# Patient Record
Sex: Female | Born: 1950
Health system: Southern US, Community
[De-identification: ages and names within clinical notes are randomized; demographics above are authoritative.]

## PROBLEM LIST (undated history)

## (undated) DIAGNOSIS — H269 Unspecified cataract: Secondary | ICD-10-CM

## (undated) DIAGNOSIS — I10 Essential (primary) hypertension: Secondary | ICD-10-CM

## (undated) DIAGNOSIS — K219 Gastro-esophageal reflux disease without esophagitis: Secondary | ICD-10-CM

## (undated) DIAGNOSIS — H9190 Unspecified hearing loss, unspecified ear: Secondary | ICD-10-CM

## (undated) DIAGNOSIS — E119 Type 2 diabetes mellitus without complications: Secondary | ICD-10-CM

## (undated) DIAGNOSIS — I219 Acute myocardial infarction, unspecified: Secondary | ICD-10-CM

## (undated) DIAGNOSIS — K5792 Diverticulitis of intestine, part unspecified, without perforation or abscess without bleeding: Secondary | ICD-10-CM

## (undated) DIAGNOSIS — I499 Cardiac arrhythmia, unspecified: Secondary | ICD-10-CM

## (undated) HISTORY — DX: Acute myocardial infarction, unspecified: I21.9

## (undated) HISTORY — DX: Type 2 diabetes mellitus without complications: E11.9

## (undated) HISTORY — PX: COLONOSCOPY: SHX174

## (undated) HISTORY — DX: Essential (primary) hypertension: I10

## (undated) HISTORY — DX: Gastro-esophageal reflux disease without esophagitis: K21.9

## (undated) HISTORY — DX: Diverticulitis of intestine, part unspecified, without perforation or abscess without bleeding: K57.92

## (undated) HISTORY — DX: Cardiac arrhythmia, unspecified: I49.9

## (undated) HISTORY — PX: BREAST SURGERY: SHX581

---
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
## 1971-03-03 DIAGNOSIS — I499 Cardiac arrhythmia, unspecified: Secondary | ICD-10-CM

## 1971-03-03 HISTORY — DX: Cardiac arrhythmia, unspecified: I49.9

## 1972-03-02 HISTORY — PX: EXTERNAL EAR SURGERY: SHX627

## 1973-03-02 HISTORY — PX: CHOLECYSTECTOMY: SHX55

## 1975-03-03 HISTORY — PX: TUBAL LIGATION: SHX77

## 1998-09-19 ENCOUNTER — Inpatient Hospital Stay (HOSPITAL_COMMUNITY): Admission: AD | Admit: 1998-09-19 | Discharge: 1998-09-19 | Payer: Self-pay | Admitting: Obstetrics

## 2003-05-24 ENCOUNTER — Ambulatory Visit (HOSPITAL_COMMUNITY): Admission: RE | Admit: 2003-05-24 | Discharge: 2003-05-24 | Payer: Self-pay | Admitting: Family Medicine

## 2003-07-03 ENCOUNTER — Ambulatory Visit (HOSPITAL_COMMUNITY): Admission: RE | Admit: 2003-07-03 | Discharge: 2003-07-03 | Payer: Self-pay | Admitting: General Surgery

## 2004-03-02 DIAGNOSIS — K219 Gastro-esophageal reflux disease without esophagitis: Secondary | ICD-10-CM

## 2004-03-02 HISTORY — DX: Gastro-esophageal reflux disease without esophagitis: K21.9

## 2004-09-14 ENCOUNTER — Emergency Department (HOSPITAL_COMMUNITY): Admission: EM | Admit: 2004-09-14 | Discharge: 2004-09-14 | Payer: Self-pay | Admitting: Emergency Medicine

## 2006-03-02 DIAGNOSIS — I219 Acute myocardial infarction, unspecified: Secondary | ICD-10-CM

## 2006-03-02 HISTORY — DX: Acute myocardial infarction, unspecified: I21.9

## 2006-12-06 ENCOUNTER — Ambulatory Visit: Payer: Self-pay | Admitting: Cardiology

## 2007-04-25 ENCOUNTER — Ambulatory Visit: Payer: Self-pay | Admitting: Cardiology

## 2007-12-22 ENCOUNTER — Ambulatory Visit: Payer: Self-pay | Admitting: Cardiology

## 2008-07-26 ENCOUNTER — Ambulatory Visit: Payer: Self-pay | Admitting: Cardiology

## 2010-07-15 NOTE — Assessment & Plan Note (Signed)
[facility]                          [facility]   Wendy Mosley, Wendy Mosley                  MRN:          [id number]  DATE:07/26/2008                            DOB:          1950/11/13    REFERRING PHYSICIAN:  Lia Hopping   HISTORY OF PRESENT ILLNESS:  This patient is a 60 year old female with  history of tobacco use and palpitations.  The patient reports a history  of vasovagal syncope during the last office visit.  The patient's  symptoms were very consistent with a vasovagal episode.  She has now  increased her drinking pattern.  Her episodes have become less frequent.  Unfortunately, she is salt depleted and does not use much salt with her  diet.  I gave her instructions to liberalize her salt intake and  encouraged further fluid intake.   MEDICATIONS:  1. Aspirin 325 daily.  2. Potassium 20 mEq p.o. daily.  3. Omega-3 fish oil.  4. Over-the-counter reflux medication.   PHYSICAL EXAMINATION:  VITAL SIGNS:  Blood pressure 126/87, heart rate  94, weight 143 pounds.  NECK:  Normal carotid upstroke.  No carotid bruits.  LUNGS:  Clear breath sounds bilaterally.  HEART:  Regular rate and rhythm.  Normal S1 and S2.  No murmurs, rubs,  or gallops.  ABDOMEN:  Soft.  EXTREMITIES:  No cyanosis, clubbing, or edema.   PROBLEM LIST:  1. History of palpitations, stable.  2. Presyncope much improved with fluid intake secondary to vasovagal      syncope.  3. Remote paroxysmal atrial fibrillation, CHAD2 score is 0.  4. Tobacco use.  5. Coronary artery disease less than 50% bilaterally.  6. Insomnia.   PLAN:  1. The patient will be scheduled next visit for carotid Dopplers.  2. The patient has had no significant vasovagal syncope and is finally      now starting to      increase her salt intake.  We also gave her some literature about      vasovagal syncope.  3. The patient can follow up with [place] in 6 months.     Learta Codding,  MD,FACC  Electronically Signed    GED/MedQ  DD: 07/26/2008  DT: 07/27/2008  Job #: [id number]   cc:   Lia Hopping

## 2010-07-15 NOTE — Assessment & Plan Note (Signed)
[facility]                          [facility]ICE NOTE   TARNESHA, ULLOA                  MRN:          [id number]  DATE:04/25/2007                            DOB:          1951/01/06    PRIMARY CARDIOLOGIST:  Dr. Lewayne Bunting.   REASON FOR VISIT:  Post hospital followup.   [name] presents to our clinic for her official post hospital  followup, although she was discharged from [facility] back in  October 2008.  At that time, she was referred to [place] for evaluation of a  near-syncopal episode.  She presented with remote history of PAF and no  other known history of heart disease.   The patient underwent extensive workup, including 2-D echocardiography  and exercise stress testing.  Echocardiogram showed normal LVEF (55% to  60%), with no wall motion abnormalities and no significant valvular  disease.   The stress test was also negative, during which the patient exercised  nearly 8 minutes. There was no inducible chest pain, nor significant EKG  changes.  Perfusion imaging showed no evidence of ischemia; calculated  ejection fraction was 69%.   Additionally, the patient had carotid Dopplers, in light of a history of  nonobstructive disease by previous studies in 2008.  This more recent  study, however, showed only minimal atherosclerosis.   Further recommendations consisted of an outpatient CardioNet monitoring;  however, the patient informs me today that this was never arranged.   From a clinical standpoint, Ms. Lattimore has not had any recurrent  spells.  She reports compliance with her medications, of which atenolol  was apparently added during this hospitalization; however, she just  recently ran out this past week, but otherwise felt that it did seem to  help suppress her occasional palpitations.   Unfortunately, Ms. 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   Electrocardiogram today reveals NSR at 56 BPM with normal axis and  nonspecific ST changes.   CURRENT MEDICATIONS:  1. Full-dose aspirin.  2. Nexium.  3. Potassium 20 mEq daily.   PHYSICAL EXAM:  VITAL SIGNS:  Blood pressure 109/69, pulse 74, regular,  weight 161.6.  GENERAL:  A 60 year old female sitting upright in no distress.  HEENT:  Normocephalic, atraumatic.  NECK:  Palpable bilateral carotid pulses without bruits; no JVD.  LUNGS:  Clear to auscultation in all fields.  HEART:  Regular rate and rhythm (S1 and S2), no significant murmurs.  No  rubs.  ABDOMEN:  Soft, nontender, intact bowel sounds.  EXTREMITIES:  No significant edema.  NEUROLOGIC:  No focal deficits.   IMPRESSION:  1. Palpitations.      a.     Improved with the addition of beta blocker.  2. Normal left ventricular function.  3. Remote paroxysmal atrial fibrillation.      a.     [place] II score:  zero.  4. History of tobacco.  5. Preserved left ventricular function.  6. Relative hypotension.   PLAN:  1. We will defer further evaluation with a CardioNet monitor, in the  absence of any recurrent spells.  Moreover, the patient has been      recently laid off from her job and I would like to try to keep her      financial costs to a minimum, if at all possible.  If she does have      worsening palpitations, and/or recurrent near syncope, then we will      further evaluate with a CardioNet monitor.  2. With respect to medications, I suggested that we not continue      atenolol.  It is not clear to me why this beta blocker was chosen,      given that she was not on any beta blocker prior to admission.  She      is currently hypotensive and, in fact, presents with no history of      hypertension.  I therefore suggested that we provide her with a      prescription for diltiazem 30 mg to be used as needed, if she were      to have any sustained tachy-palpitations.  She was quite agreeable      with  this plan.  3. BMET with magnesium level.  The patient has been taking      supplemental potassium since her hospitalization in October.  She      did have a potassium level of 3.1 at that time.  I do not have any      followup blood work with respect to electrolytes.  We will check a      magnesium level to see if this is low as well, and possibly explain      her hypokalemia.  Of note, she was      not on any diuretic at time of admission.  4. Schedule return clinic followup with myself and Dr. Andee Lineman in 4      months.      Gene Serpe, PA-C  Electronically Signed      Learta Codding, MD,FACC  Electronically Signed   GS/MedQ  DD: 04/25/2007  DT: 04/26/2007  Job #: [id number]   cc:   Lia Hopping

## 2010-07-15 NOTE — Assessment & Plan Note (Signed)
[facility]                          [facility]ICE NOTE   Wendy Mosley, Wendy Mosley                  MRN:          [id number]  DATE:12/22/2007                            DOB:          1951/02/05    REFERRING PHYSICIAN:  Lia Hopping   REFERRING PHYSICIAN:  Lia Hopping   HISTORY OF PRESENT ILLNESS:  The patient is a 60 year old female with  prior history of tobacco use and palpitations.  The patient reports near  syncopal episode approximately 2 weeks ago.  She states she was sitting  in a game room.  She has suddenly started feeling hot and got nauseated.  She felt the clothing around got very tight and she stood up rapidly to  go to the bathroom, suddenly wave of heat came over her and she felt her  legs were giving out by the time she try to get into the bathroom, she  slumped over and briefly was presyncopal without any definite loss of  consciousness.  She was profusely sweating during the episode.  After  they applied some cold water rags to her forehead, she improved.  Her  friend also gave a dose of Cardizem 30 mg p.o. as previously recommended  by Gene Serpe, PA-C.  Shortly thereafter, the patient improved and was  able to stand up and had no further problems.  The patient had actually  reports to me that since she has been laid off.  She has had felt poorly  and depressed.  She also stays up all night long and has a very  disturbed sleep pattern.  Next, the patient in the office today looks  exhausted.   MEDICATIONS:  1. Aspirin 325 mg p.o. daily.  2. Potassium 20 mg p.o. daily.  3. Negative fish oil daily.  4. Over-the-counter reflux medications.   ALLERGIES:  SULFA causes rash.   PHYSICAL EXAMINATION:  VITAL SIGNS:  Blood pressure 128/84, heart rate  93, and weight 157 pounds.  NECK:  Normal carotid stroke and no carotid bruits.  LUNGS:  Clear.  Breath sounds bilaterally.  HEART:  Regular rate and rhythm with normal S1  and S2.  No murmurs,  rubs, or gallops.  ABDOMEN:  Soft, nontender.  No rebound or guarding.  Good bowel sounds.  EXTREMITIES:  No cyanosis, clubbing, or edema.   PROBLEM LIST:  1. History of palpitations.  2. Recent presyncope secondary to vasovagal syncope.  3. Remote paroxysmal atrial fibrillation, CHAD2 score is 0.  4. History of tobacco use.  5. Carotid artery disease, left 50% bilaterally.  6. Insomnia.   PLAN:  1. The patient appears very exhausted and depressed since she has lost      her job.  I have recommended that she tries to resume a normal      sleep pattern and I gave her a small prescription of trazodone 50      mg p.o. q.h.s. particular in light of the fact that I think she      also has depression.  I ask her to follow up further with her      primary care  physician on this issue.  2. Next visit, the patient should be considered for repeat carotid      Dopplers.  3. The patient has definite vasovagal syncope and I gave her the      patient information regarding this.  I also told her that if she      has an event like that one she described above, she should not take      p.r.n. Cardizem, but only due to her rapid heart rates and      palpitations without presyncope.  4. The patient will follow up with [place] in 6 months.     Learta Codding, MD,FACC  Electronically Signed    GED/MedQ  DD: 12/25/2007  DT: 12/25/2007  Job #: [id number]   cc:   Lia Hopping

## 2010-08-19 ENCOUNTER — Emergency Department (HOSPITAL_COMMUNITY)
Admission: EM | Admit: 2010-08-19 | Discharge: 2010-08-20 | Disposition: A | Discharge status: Home / Self Care | Payer: Self-pay | Attending: Emergency Medicine | Admitting: Emergency Medicine

## 2010-08-19 DIAGNOSIS — B3731 Acute candidiasis of vulva and vagina: Secondary | ICD-10-CM | POA: Insufficient documentation

## 2010-08-19 DIAGNOSIS — N949 Unspecified condition associated with female genital organs and menstrual cycle: Secondary | ICD-10-CM | POA: Insufficient documentation

## 2010-08-19 DIAGNOSIS — N9089 Other specified noninflammatory disorders of vulva and perineum: Secondary | ICD-10-CM | POA: Insufficient documentation

## 2010-08-19 DIAGNOSIS — B373 Candidiasis of vulva and vagina: Secondary | ICD-10-CM | POA: Insufficient documentation

## 2010-08-19 DIAGNOSIS — N899 Noninflammatory disorder of vagina, unspecified: Secondary | ICD-10-CM | POA: Insufficient documentation

## 2011-07-02 ENCOUNTER — Other Ambulatory Visit (HOSPITAL_COMMUNITY)
Admission: RE | Admit: 2011-07-02 | Discharge: 2011-07-02 | Disposition: A | Discharge status: Home / Self Care | Payer: Self-pay | Source: Ambulatory Visit | Attending: Family Medicine | Admitting: Family Medicine

## 2011-07-02 ENCOUNTER — Ambulatory Visit (INDEPENDENT_AMBULATORY_CARE_PROVIDER_SITE_OTHER): Payer: Self-pay | Admitting: Sports Medicine

## 2011-07-02 ENCOUNTER — Encounter: Payer: Self-pay | Admitting: Sports Medicine

## 2011-07-02 VITALS — BP 147/81 | HR 81 | Temp 98.5°F | Ht 64.0 in | Wt 165.7 lb

## 2011-07-02 DIAGNOSIS — IMO0001 Reserved for inherently not codable concepts without codable children: Secondary | ICD-10-CM

## 2011-07-02 DIAGNOSIS — N904 Leukoplakia of vulva: Secondary | ICD-10-CM

## 2011-07-02 DIAGNOSIS — Z01419 Encounter for gynecological examination (general) (routine) without abnormal findings: Secondary | ICD-10-CM | POA: Insufficient documentation

## 2011-07-02 DIAGNOSIS — Z0000 Encounter for general adult medical examination without abnormal findings: Secondary | ICD-10-CM

## 2011-07-02 DIAGNOSIS — N952 Postmenopausal atrophic vaginitis: Secondary | ICD-10-CM

## 2011-07-02 DIAGNOSIS — L940 Localized scleroderma [morphea]: Secondary | ICD-10-CM

## 2011-07-02 DIAGNOSIS — Z1159 Encounter for screening for other viral diseases: Secondary | ICD-10-CM | POA: Insufficient documentation

## 2011-07-02 DIAGNOSIS — K219 Gastro-esophageal reflux disease without esophagitis: Secondary | ICD-10-CM | POA: Insufficient documentation

## 2011-07-02 DIAGNOSIS — Z124 Encounter for screening for malignant neoplasm of cervix: Secondary | ICD-10-CM

## 2011-07-02 DIAGNOSIS — Z23 Encounter for immunization: Secondary | ICD-10-CM

## 2011-07-02 DIAGNOSIS — R030 Elevated blood-pressure reading, without diagnosis of hypertension: Secondary | ICD-10-CM

## 2011-07-02 LAB — BASIC METABOLIC PANEL
BUN: 16 mg/dL (ref 6–23)
CO2: 30 mEq/L (ref 19–32)
Calcium: 8.9 mg/dL (ref 8.4–10.5)
Chloride: 104 mEq/L (ref 96–112)
Creat: 1.22 mg/dL — ABNORMAL HIGH (ref 0.50–1.10)
Glucose, Bld: 91 mg/dL (ref 70–99)
Potassium: 3.9 mEq/L (ref 3.5–5.3)
Sodium: 142 mEq/L (ref 135–145)

## 2011-07-02 LAB — LDL CHOLESTEROL, DIRECT: Direct LDL: 138 mg/dL — ABNORMAL HIGH

## 2011-07-02 MED ORDER — ASPIRIN 81 MG PO TBEC: 81.0000 mg | DELAYED_RELEASE_TABLET | Freq: Every day | ORAL | Status: DC

## 2011-07-02 MED ORDER — ESTRADIOL 0.1 MG/GM VA CREA: 4.0000 g | TOPICAL_CREAM | Freq: Every day | VAGINAL | Status: DC

## 2011-07-02 MED ORDER — B COMPLEX VITAMINS PO CAPS: 1.0000 | ORAL_CAPSULE | Freq: Every day | ORAL | Status: AC

## 2011-07-02 MED ORDER — OMEPRAZOLE 40 MG PO CPDR: 40.0000 mg | DELAYED_RELEASE_CAPSULE | Freq: Every day | ORAL | Status: DC

## 2011-07-02 NOTE — Assessment & Plan Note (Signed)
Will continue to monitor and subsequent visits

## 2011-07-02 NOTE — Patient Instructions (Addendum)
It was nice to see you today.  We discussed a lot today.    Today we discussed:  Your Vaginal itching is from Atrophic Vaginitis.  I have sent in a prescription for you.  Please see attached information.  Please return to your GI Doctor for a repeat Colonoscopy as soon as you can.  Since you are likely due.  I have sent in a prescription for your Reflux.  See attached information.   For your weight and your diet please see below information.    We have gotten lab work from you today will send you a letter with your results.  Please plan to return to see me in 2-3 months to check on your vaginal itching .  If you need anything prior to seeing me please call the clinic.   Here are some basic nutrition rules to remember:  "Eat Real Foods & Drink Real Drinks" - if you think it was made in a factory . . it is likely best to avoid it as a staple in your diet.  Limiting these types of foods to 1-2 times per week is a good idea.  Sticking with fresh fruits and vegetables as well as home cooked meals will typically provide more nutrition and less salt than prepackaged meals.     Limit the amount of sugar sweetened and artificially sweetened foods and beverages.  Sticking with water flavored with a slice of lemon, lime or orange is a great option if you want something with flavor in it.  Using flavored seltzer water to flavor plain water will also add some bite if you want something more than flavor.     Here are 2 of my favorite web sites that provide great nutrition and exercise advice.   www.eatsmartmovemore[place].com www.DisposableNylon.be   Diet for GERD or PUD Nutrition therapy can help ease the discomfort of gastroesophageal reflux disease (GERD) and peptic ulcer disease (PUD).   HOME CARE INSTRUCTIONS    Eat your meals slowly, in a relaxed setting.   Eat 5 to 6 small meals per day.   If a food causes distress, stop eating it for a period of time.  FOODS TO AVOID  Coffee, regular or  decaffeinated.   Cola beverages, regular or low calorie.   Tea, regular or decaffeinated.   Pepper.   Cocoa.   High fat foods, including meats.   Butter, margarine, hydrogenated oil (trans fats).   Peppermint or spearmint (if you have GERD).   Fruits and vegetables if not tolerated.   Alcohol.   Nicotine (smoking or chewing). This is one of the most potent stimulants to acid production in the gastrointestinal tract.   Any food that seems to aggravate your condition.  If you have questions regarding your diet, ask your caregiver or a registered dietitian. TIPS  Lying flat may make symptoms worse. Keep the head of your bed raised 6 to 9 inches (15 to 23 cm) by using a foam wedge or blocks under the legs of the bed.   Do not lay down until 3 hours after eating a meal.   Daily physical activity may help reduce symptoms.  MAKE SURE YOU:    Understand these instructions.   Will watch your condition.   Will get help right away if you are not doing well or get worse.  Document Released: 02/16/2005 Document Revised: 02/05/2011 Document Reviewed: 01/02/2011 [facility] Patient Information 2012 [place], [place].  Atrophic Vaginitis Atrophic vaginitis is a problem of low levels of  estrogen in women. This problem can happen at any age. It is most common in women who have gone through menopause ("the change").   HOW WILL I KNOW IF I HAVE THIS PROBLEM? You may have:  Trouble with peeing (urinating), such as:   Going to the bathroom often.   A hard time holding your pee until you reach a bathroom.   Leaking pee.   Having pain when you pee.   Itching or a burning feeling.   Vaginal bleeding and spotting.   Pain during sex.   Dryness of the vagina.   A yellow, bad-smelling fluid (discharge) coming from the vagina.  HOW WILL MY DOCTOR CHECK FOR THIS PROBLEM?  During your exam, your doctor will likely find the problem.   If there is a vaginal fluid, it may be checked for  infection.  HOW WILL THIS PROBLEM BE TREATED? Keep the vulvar skin as clean as possible. Moisturizers and lubricants can help with some of the symptoms. Estrogen replacement can help. There are 2 ways to take estrogen:  Systemic estrogen gets estrogen to your whole body. It takes many weeks or months before the symptoms get better.   You take an estrogen pill.   You use a skin patch. This is a patch that you put on your skin.   If you still have your uterus, your doctor may ask you to take a hormone. Talk to your doctor about the right medicine for you.   Estrogen cream.  This puts estrogen only at the part of your body where you apply it. The cream is put into the vagina or put on the vulvar skin. For some women, estrogen cream works faster than pills or the patch.  CAN ALL WOMEN WITH THIS PROBLEM USE ESTROGEN? No. Women with certain types of cancer, liver problems, or problems with blood clots should not take estrogen. Your doctor can help you decide the best treatment for your symptoms. Document Released: 08/05/2007 Document Revised: 02/05/2011 Document Reviewed: 08/05/2007 [facility] Patient Information 2012 [place], [place].

## 2011-07-02 NOTE — Assessment & Plan Note (Addendum)
Pt with profound sclerosis and vaginal atrophy with dryness and friability; No significant discharge (no wet prep collected).  Planned to trial Estradiol cream - put unable to afford.  Prior Yeast infections; low likelyhood given complete lack of vaginal discharge and profound atrophic changes; will potentially show on PAP if reinfection but withholding treatment at this time.  Lichen Sclerosus likely etiology of majority of symptoms so will defer atrophic tx at this time

## 2011-07-02 NOTE — Assessment & Plan Note (Addendum)
Discussed previous health care.    Pt reports due for repeat colonoscopy as had prior polyps on exam and has been >than 5 yrs although unsure of exactly when last colonoscopy was  PAP smear today.  If negative cytology with negative reflex HPV then will be done with PAPs as she will be >65 when she is next due.  Health Maintenance Labs - screening blood work for DM, HLD performed today  Mammogram - [facility]  Tdap today

## 2011-07-02 NOTE — Assessment & Plan Note (Signed)
Rx for PPI to help with healing; previously seen.  Handout given on foods to eat to help.  No prior PPI usage.  Short term course of 3 months to allow healing.

## 2011-07-03 ENCOUNTER — Other Ambulatory Visit: Payer: Self-pay | Admitting: *Deleted

## 2011-07-03 NOTE — Telephone Encounter (Signed)
Patient came by office stating estradiol cream will cost $164.00. She is going to check with pharmacy  about any kind of discount from the med company. However will ask Dr. Berline Chough if he can prescribe anything less expensive. Also Prilosec will cost her $54.00 . Anything cheaper ?[name]   Call [phone]

## 2011-07-08 MED ORDER — CLOBETASOL PROPIONATE 0.05 % EX CREA: TOPICAL_CREAM | Freq: Every day | CUTANEOUS | Status: DC

## 2011-07-08 NOTE — Telephone Encounter (Signed)
Called and discussed with pt.  She was able to get OTC Prilosec.  Somewhat improved.  For vaginal itching issues will change to clobetasol cream to be applied externally for presumed vulvar lichen planus. Discussed treatment options and pt voiced understanding and agreement  Pt instructed to make follow up appointment in 2-3 weeks if not improved otherwise as scheduled

## 2011-07-12 DIAGNOSIS — N904 Leukoplakia of vulva: Secondary | ICD-10-CM | POA: Insufficient documentation

## 2011-07-12 NOTE — Progress Notes (Signed)
HPI:  Wendy Mosley is a 61 y.o. female presenting today To establish as a pt.  She reports history of reflux as well as new onset of vaginal itching.  She is due for a pap smear.  She reports frequent heart burn following fatty meals and spicy meals; worse with laying down.  Has not been on PPI recently.  VAginal itching.  REports it has been present for >3 months and is progressivly worsening.  She has been scratching and washing the area frequently.  Not sexually active; vaginal discharge; no vaginal bleeding since menopause.     ROS  Constitutional No fatigue  Infectious No fevers no chills  Resp No cough, no congestion  Cardiac No chest pain, no palpitations  GI Reflux as above; no melana or hematachezia  GU No dysuria; skin changes over Mons pubis; no discharge  Skin No rashes with exception of mons pubis  Psych No prior hospitalizations for psychiatric issues  Neuro No seizures  Trauma No recent trauma reported  Activity Performs all ADLS independently  MEDS No difficulty with obtaining   HISTORY Medications Reviewed & Updated, see associated section Medical Hx Reviewed: Significant for MI in 2008 and GERD Family History Reviewed: Yes  Social History Reviewed:  Significant for non-smoker  PE: GENERAL:  Adult AA female.  Examined in [facility].    In no discomfort; norespiratory distress.   PSYCH: Alert and appropriately interactive; Insight:Absent  Alert, oriented, thought content appropriate H&N: AT/[place], MMM, no scleral icterus, EOMi THORAX: HEART: RRR, S1/S2 heard, no murmur LUNGS: CTA B, no wheezes, no crackles ABDOMEN:  +BS, soft, non-tender, no rigidity, no guarding, no masses/organomegaly EXTREMITIES: Moves all 4 extremities spontaneously, warm well perfused, no edema, bilateral DP and PT pulses 2/4.   >PELVIC/RECTAL:   VULVA: vulvar excoriation with primary erythematous plaque with crusting but no exudate.  VAGINA: atrophic, CERVIX: friable to swab, lesions  absent, no discharge noted, cervical motion tenderness absent, UTERUS: uterus is normal size, shape, consistency and nontender, ADNEXA: normal adnexa in size, nontender and no masses,  PAP: Pap smear done today,  exam chaperoned by Jimmy Footman.

## 2011-07-12 NOTE — Assessment & Plan Note (Signed)
Severe itching due to Lichen sclerosus.  Will trial high dose steroids per UpToDate recommendations.  If not improving consider vulvar biopsy for better characteristics.  WIll have follow up in 6 weeks

## 2011-07-20 ENCOUNTER — Encounter: Payer: Self-pay | Admitting: Sports Medicine

## 2011-09-17 ENCOUNTER — Encounter: Payer: Self-pay | Admitting: Cardiology

## 2011-09-22 ENCOUNTER — Telehealth: Payer: Self-pay | Admitting: *Deleted

## 2011-09-22 NOTE — Telephone Encounter (Signed)
Message copied by Deno Etienne on Tue Sep 22, 2011  8:54 AM ------      Message from: Gaspar Bidding D      Created: Mon Sep 21, 2011 10:32 PM       Pt has been lost to follow up.  Please call and see if she is interested in returning to discuss her ongoing medical issues as well as review her lab work!

## 2011-09-22 NOTE — Telephone Encounter (Signed)
Called pt and lvm for her to make an appt to discuss her lab results and any other medical concerns.Laureen Ochs, Viann Shove

## 2011-09-23 NOTE — Telephone Encounter (Signed)
Called and LMOVM for pt to call to schedule an appt with Dr. Berline Chough. I will close this encounter due to numerous VM being left for pt to call back.Loralee Pacas [place]

## 2011-10-07 ENCOUNTER — Ambulatory Visit: Payer: Self-pay | Admitting: Sports Medicine

## 2011-10-15 ENCOUNTER — Ambulatory Visit: Payer: Self-pay | Admitting: Sports Medicine

## 2011-10-23 ENCOUNTER — Ambulatory Visit: Payer: Self-pay | Admitting: Sports Medicine

## 2011-12-21 ENCOUNTER — Ambulatory Visit (INDEPENDENT_AMBULATORY_CARE_PROVIDER_SITE_OTHER): Payer: Self-pay | Admitting: Sports Medicine

## 2011-12-21 ENCOUNTER — Encounter: Payer: Self-pay | Admitting: Sports Medicine

## 2011-12-21 VITALS — BP 127/67 | HR 84 | Temp 98.6°F | Ht 64.0 in | Wt 163.4 lb

## 2011-12-21 DIAGNOSIS — IMO0001 Reserved for inherently not codable concepts without codable children: Secondary | ICD-10-CM

## 2011-12-21 DIAGNOSIS — Z0000 Encounter for general adult medical examination without abnormal findings: Secondary | ICD-10-CM

## 2011-12-21 DIAGNOSIS — L940 Localized scleroderma [morphea]: Secondary | ICD-10-CM

## 2011-12-21 DIAGNOSIS — N952 Postmenopausal atrophic vaginitis: Secondary | ICD-10-CM

## 2011-12-21 DIAGNOSIS — N904 Leukoplakia of vulva: Secondary | ICD-10-CM

## 2011-12-21 MED ORDER — CLOBETASOL PROPIONATE 0.05 % EX CREA: TOPICAL_CREAM | Freq: Every day | CUTANEOUS | Status: DC

## 2011-12-21 MED ORDER — ESTRADIOL 25 MCG VA TABS: 25.0000 ug | ORAL_TABLET | VAGINAL | Status: DC

## 2011-12-21 NOTE — Assessment & Plan Note (Signed)
Plans to return to care with insurance for risk stratification testing and CA screening

## 2011-12-21 NOTE — Assessment & Plan Note (Signed)
PT improved today.  Will continue to follow

## 2011-12-21 NOTE — Progress Notes (Signed)
  [facility]  Patient name: Wendy Mosley MRN [id number]  Date of birth: 1950-10-24  CC & HPI:  Wendy Mosley is a 61 y.o. female presenting today for follow up of:  # Vulvar itching:  She was previously lost to follow up with presumptive diagnosis of lichen sclerosis.  She does not have insurance and was unable to return to care until now.  She reports signifcant improvement in skin changes and pruritis with clobetasol cream following last appointment.  Was unable to afford estrogen cream for atrophic changes.  Reports continued vaginal dryness and vaginal irritation.  No vaginal bleeding.  Reprots some lesion bleeding due to secondary excoriations on occasion.    Cleaning the vulvar area multiple times per day with either ethyl alcohol or hydrogen peroxide.    ROS:  Denies vaginal bleeding, headaches, vision changes, other skin changes, chest pain, dyspnea  Pertinent History Reviewed:  Medical & Surgical Hx:  Reviewed: Significant for GERD, elevated BP Medications: Reviewed & Updated - see associated section Social History: Reviewed -  reports that she quit smoking about 10 months ago. Her smoking use included Cigarettes. She has a 30 pack-year smoking history. She does not have any smokeless tobacco history on file.   Objective Findings:  Vitals:  Filed Vitals:   12/21/11 1509  BP: 127/67  Pulse: 84  Temp: 98.6 F (37 C)   PE: GENERAL:  Adult AA female. In no discomfort; no respiratory distress. PSYCH: Alert and appropriately interactive; Insight:Good   H&N: AT/[place], trachea midline EENT:  MMM, no scleral icterus, EOMi EXTREMITIES: Moves all 4 extremities spontaneously, warm well perfused, no edema, bilateral DP and PT pulses 2/4. Pelvic exam: VULVA: vulvar excoriation with sclerotic and lichenification of skin and associated vulvar erythema; broken hairs in vulvar area, redundant labial tissue VAGINA: atrophic, vaginal lesion on posterior vaginal wall,  CERVIX: ectropion , no discharge noted, multiparous os, UTERUS: uterus is normal size, shape, consistency and nontender, ADNEXA: normal adnexa in size, nontender and no masses, exam chaperoned by Dr. Sarah [place].     Assessment & Plan:

## 2011-12-21 NOTE — Patient Instructions (Addendum)
It was great to see you.  I would like for you to follow up with Dr Jennette Kettle for a vulvar biopsy to confirm this is LICHEN SCLEROSIS.  This should be done as soon as possible.   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  Use the steroid cream twice a day for the next week then daily after that.  The estrogen pills are vaginal suppositories and are to be used as instructed   Follow up with me in 4 weeks.

## 2011-12-21 NOTE — Assessment & Plan Note (Signed)
Will trial Vagifem estradiol vaginal tablets.  Qod X 2 weeks then 2X/week.  25$/8pills

## 2011-12-21 NOTE — Assessment & Plan Note (Signed)
Improved with Clobetasol but currently out.  Will try to treat again with 1 month course and RTC at that time for re-evalation.  Vulvar biopsy indicated to evaluate for chronic vulvar skin lesion given duration.  No focal lesion on exam but diffuse lichenification likely secondary to Lichen sclerosus.  Biopsy to confirm diagnosis and r/o malignant process. > requested to f/u with Dr. Jennette Kettle in [facility]

## 2012-01-18 ENCOUNTER — Ambulatory Visit: Payer: Self-pay | Admitting: Sports Medicine

## 2012-07-18 ENCOUNTER — Encounter: Payer: Self-pay | Admitting: Sports Medicine

## 2012-07-18 NOTE — Telephone Encounter (Signed)
Error

## 2012-07-27 ENCOUNTER — Encounter: Payer: Self-pay | Admitting: Sports Medicine

## 2012-07-27 ENCOUNTER — Ambulatory Visit (INDEPENDENT_AMBULATORY_CARE_PROVIDER_SITE_OTHER): Payer: Self-pay | Admitting: Sports Medicine

## 2012-07-27 VITALS — BP 128/88 | HR 83 | Temp 99.1°F | Ht 64.0 in | Wt 165.0 lb

## 2012-07-27 DIAGNOSIS — N904 Leukoplakia of vulva: Secondary | ICD-10-CM

## 2012-07-27 DIAGNOSIS — L940 Localized scleroderma [morphea]: Secondary | ICD-10-CM

## 2012-07-27 DIAGNOSIS — J309 Allergic rhinitis, unspecified: Secondary | ICD-10-CM

## 2012-07-27 MED ORDER — CLOBETASOL PROPIONATE 0.05 % EX CREA: TOPICAL_CREAM | Freq: Two times a day (BID) | CUTANEOUS | Status: DC

## 2012-07-27 MED ORDER — FLUTICASONE PROPIONATE 50 MCG/ACT NA SUSP: 2.0000 | Freq: Every day | NASAL | Status: DC

## 2012-07-27 NOTE — Assessment & Plan Note (Signed)
No indications for respiratory compromise.  Nonsmoker Zyrtec and Flonase

## 2012-07-27 NOTE — Progress Notes (Signed)
  [facility]  Patient name: Wendy Mosley MRN [id number]  Date of birth: [birth year]  CC & HPI:  Wendy Mosley is a 62 y.o. female presenting today for follow up of:  # Pelvic Itching and Discomfort: Reports has been unchanged over the last 7 months.  She was unable to followup with Dr. Jennette Kettle for a biopsy.  She is requesting steroids again as this did temporarily improve her symptoms.  She is having difficulty performing her job duties and exercising due to the discomfort.  She denies any vaginal bleeding in approximately a decade.  ------------------------------------------------------------------------------------------------------------------ Medication Compliance: noncompliant some of the time  Diet Compliance: noncompliant some of the time ------------------------------------------------------------------------------------------------------------------ New Concerns:  # Nasal congestion and cough: Reports she has had some postnasal drip and nasal congestion for approximately one week.  Denies any fevers, chills.  Has not taken anything in believes it is allergy symptoms that she has had previously.  She was unsure what to take    ROS:  PER HPI  Pertinent History Reviewed:  Medical & Surgical Hx:  Reviewed: Significant for Normal PAP in 2013, Atrophic vaginitis Medications: Reviewed & Updated - See associated section in EMR Social History: Reviewed -  reports that she quit smoking about 17 months ago. Her smoking use included Cigarettes. She has a 30 pack-year smoking history. She does not have any smokeless tobacco history on file.   does not have insurance yet  Objective Findings:  Vitals: BP 128/88  Pulse 83  Temp(Src) 99.1 F (37.3 C) (Oral)  Ht 5\' 4"  (1.626 m)  Wt 165 lb (74.844 kg)  BMI 28.31 kg/m2  SpO2 99%  PE: GENERAL:  Adult AA  female. In no discomfort; no respiratory distress. PSYCH: Alert and appropriately interactive; Insight:Fair H&N: AT/[place],  trachea midline EENT:  MMM, no scleral icterus, EOMi, nasal pallor and congestion.  Mild posterior oropharyngeal streaking erythema HEART: RRR, S1/S2 heard, no murmur LUNGS: CTA B, no wheezes, no crackles EXTREMITIES: Moves all 4 extremities spontaneously, warm well perfused, no edema, bilateral DP and PT pulses 2/4.   PELVIC: Deferred given examined multiple previous times.  Assessment & Plan:

## 2012-07-27 NOTE — Patient Instructions (Addendum)
It was nice to see you today.   Today we discussed: 1. Allergic rhinitis I have given you: - fluticasone (FLONASE) 50 MCG/ACT nasal spray; Place 2 sprays into the nose daily.  Dispense: 16 g; Refill: 6  2. Lichen sclerosus et atrophicus of the vulva Last prescription for: - clobetasol cream (TEMOVATE) 0.05 %; Apply topically 2 (two) times daily. Apply topically from belt line down  Dispense: 30 g; Refill: 0  You need to schedule a followup with Dr. Jennette Kettle in the [facility] clinic for a vulvar biopsy.   Please plan to return to see me in 2-3.  If you need anything prior to seeing me please call the clinic.  Please follow up with Britta Mccreedy for discussion of the orange card.  Please look into obtaining insurance.  Please Bring all medications with you to each appointment.

## 2012-07-27 NOTE — Assessment & Plan Note (Addendum)
Will give 1 further refill for topical steroid.  Patient HAS to followup with Dr. Jennette Kettle for biopsy prior to any further refills. Discussed the risks and benefits of treatment without further diagnosis and patient voices understanding.  Exam deferred given known need for biopsy.   > Patient needs vulvar biopsy for lichen sclerosus versus malignant process.  Treated with topical steroids with only minimal relief but has been lost to follow up due to insurance.

## 2012-08-19 ENCOUNTER — Ambulatory Visit: Payer: Self-pay | Admitting: Sports Medicine

## 2012-09-19 ENCOUNTER — Ambulatory Visit: Payer: Self-pay | Admitting: Family Medicine

## 2012-10-19 ENCOUNTER — Ambulatory Visit (INDEPENDENT_AMBULATORY_CARE_PROVIDER_SITE_OTHER): Payer: No Typology Code available for payment source | Admitting: Sports Medicine

## 2012-10-19 ENCOUNTER — Encounter: Payer: Self-pay | Admitting: Sports Medicine

## 2012-10-19 VITALS — BP 128/72 | HR 86 | Temp 98.9°F | Ht 64.0 in | Wt 167.0 lb

## 2012-10-19 DIAGNOSIS — R030 Elevated blood-pressure reading, without diagnosis of hypertension: Secondary | ICD-10-CM

## 2012-10-19 DIAGNOSIS — L940 Localized scleroderma [morphea]: Secondary | ICD-10-CM

## 2012-10-19 DIAGNOSIS — J309 Allergic rhinitis, unspecified: Secondary | ICD-10-CM

## 2012-10-19 DIAGNOSIS — IMO0001 Reserved for inherently not codable concepts without codable children: Secondary | ICD-10-CM

## 2012-10-19 DIAGNOSIS — Z0000 Encounter for general adult medical examination without abnormal findings: Secondary | ICD-10-CM

## 2012-10-19 DIAGNOSIS — N904 Leukoplakia of vulva: Secondary | ICD-10-CM

## 2012-10-19 MED ORDER — TRIAMCINOLONE ACETONIDE(NASAL) 55 MCG/ACT NA INHA: 2.0000 | Freq: Every day | NASAL | Status: DC

## 2012-10-19 NOTE — Assessment & Plan Note (Signed)
Nasocort

## 2012-10-19 NOTE — Assessment & Plan Note (Addendum)
Mammogram - given hand out for [facility] - the patient has [facility] discount

## 2012-10-19 NOTE — Patient Instructions (Signed)
It was nice to see you today, thanks for coming in!  Problem List Items Addressed This Visit   Lichen sclerosus et atrophicus of the vulva - Primary     [facility] follow up with Dr. Jennette Kettle for Vulvar Biopsy given persistent symptoms.  Will arrange today on checkout     Health maintenance examination     Mammogram - given hand for going to [facility]    Allergic rhinitis     Nasocort       Please plan to return to see Dr. Jennette Kettle ASAP.  If you need anything prior to your next visit please call the clinic. 3-4 months with me  Please Bring all medications or accurate medication list with you to each appointment; an accurate medication list is essential in providing you the best care possible.

## 2012-10-19 NOTE — Assessment & Plan Note (Addendum)
[facility] follow up with Dr. Jennette Kettle for Vulvar Biopsy given persistent symptoms.  Will arrange today on checkout.  Please see previous notes the patient has a long-standing history of vulvar itching and apparently can sclerosis on exam.  She does get mild improvement with corticosteroid however given the duration and extent as noted on prior exams patient is in need of a biopsy to evaluate for potential malignant condition.I have discussed this with her multiple times in the past and feel uncomfortable to continue prescribing high-dose corticosteroids without further evaluation.

## 2012-10-25 NOTE — Progress Notes (Signed)
  [facility]  Patient name: Wendy Mosley MRN [id number]  Date of birth: 03-28-1950  CC & HPI:  Wendy Mosley is a 62 y.o. female presenting to clinic.  Chief Complaint  Patient presents with  . Follow-up Here to discuss her persistent vulvar aging.  She has continued using corticosteroids.  She did not schedule follow up appointment as previously requested due to unknown reasons.  She reports that she is continuing to have severe symptoms and it seems to worsen again over the past 1 month.  She does continue to have symptoms of allergic rhinitis.  She has nasal congestion on occasion.  She did not take any medication prescribed last time but does think that Nasacort would be a good medicine for her to 2 take as her family member has tried this medicine before and she tolerated it well.  Patient does have a history of elevated hypertension.  When she was first evaluated today is elevated but on recheck it is lower.  She denies any chest pain, shortness breath, dyspnea on exertion, orthopnea, PND.    ROS:  PER HPI  Pertinent History Reviewed:  Medical & Surgical Hx:  Reviewed: Significant for Elevated BP Medications: Reviewed & Updated - see associated section Social History: Reviewed -  reports that she quit smoking about 20 months ago. Her smoking use included Cigarettes. She has a 30 pack-year smoking history. She does not have any smokeless tobacco history on file.  Objective Findings:  Vitals: BP 128/72  Pulse 86  Temp(Src) 98.9 F (37.2 C) (Oral)  Ht 5\' 4"  (1.626 m)  Wt 167 lb (75.751 kg)  BMI 28.65 kg/m2 PE: GENERAL: Adult AA  female. In no discomfort; no respiratory distress  PSYCH: Alert and appropriate, good insight   HNEENT:  Nasal mucosa is pale and boggy.  Minimal nasal exudate.[name]  CARDIO:  RRR, S1/S2 heard, no murmur  LUNGS:  CTA B, no wheezes, no crackles  ABDOMEN:    EXTREM:   GU: Deferred, please see prior exams  SKIN:    NEUROMSK:     Assessment & Plan:   1. Lichen sclerosus et atrophicus of the vulva   2. Allergic rhinitis   3. Health maintenance examination    See problem associated charting

## 2012-10-25 NOTE — Assessment & Plan Note (Signed)
Improved on Recheck today

## 2012-10-27 ENCOUNTER — Ambulatory Visit (INDEPENDENT_AMBULATORY_CARE_PROVIDER_SITE_OTHER): Payer: No Typology Code available for payment source | Admitting: Family Medicine

## 2012-10-27 ENCOUNTER — Encounter: Payer: Self-pay | Admitting: Family Medicine

## 2012-10-27 VITALS — BP 134/76 | HR 82 | Temp 98.8°F | Ht 64.0 in | Wt 166.0 lb

## 2012-10-27 DIAGNOSIS — N904 Leukoplakia of vulva: Secondary | ICD-10-CM

## 2012-10-27 DIAGNOSIS — L940 Localized scleroderma [morphea]: Secondary | ICD-10-CM

## 2012-10-27 MED ORDER — CLOBETASOL PROPIONATE 0.05 % EX CREA: TOPICAL_CREAM | CUTANEOUS | Status: DC

## 2012-10-27 MED ORDER — CLOTRIMAZOLE-BETAMETHASONE 1-0.05 % EX CREA: TOPICAL_CREAM | CUTANEOUS | Status: DC

## 2012-10-27 NOTE — Progress Notes (Signed)
Patient ID: JANKI DIKE, female   DOB: 1950/10/10, 62 y.o.   MRN: [id number] Patient tentative prev  Dx lichen scleoris has been on clobetasol with significant improvement in itching and has regrown some hair in vulvar area.  Was recommended to have biopsy.  SUBJECTIVE: Vulvar very examined and the skin looks fairly healthy. There's not a lot of shininess to it, there is some areas of hypopigmentation. There is no specific lesion. The labia are full and normal in appearance. The clitoris is normal in appearance. There is some mild atrophy but normal for age skin. PROCEDURE NOTE: vulbar biopsy Patient given informed consent, signed copy in the chart. . 1 cc of 2% lidocaine with epinephrine was used to anesthetize the area on the right labia which or. 5 mm punch biopsy was taken. Some Monsel's solution was applied to this for local hemostasis which was easily obtained. The patient tolerated the Procedure well. Essentially no blood loss. I discussed post procedure care. There are no sutures. I will notify her of pathology.  ASSESSMENT: I am not sure this is truly lichen sclerosis. I'm not seeing it in the pretreatment phase however. Certainly this looks fairly normal with some hypopigmentation, so if it is  does lichen sclerosis, the treatment is working well. I agree with the biopsy. I would continue treatment with the clobetasol at it has significantly improved her issues with itching. I would recommend a yearly external visual examination.

## 2012-11-02 ENCOUNTER — Encounter: Payer: Self-pay | Admitting: Family Medicine

## 2013-01-05 ENCOUNTER — Ambulatory Visit: Payer: No Typology Code available for payment source | Admitting: Family Medicine

## 2013-01-19 ENCOUNTER — Ambulatory Visit (INDEPENDENT_AMBULATORY_CARE_PROVIDER_SITE_OTHER): Payer: No Typology Code available for payment source | Admitting: Family Medicine

## 2013-01-19 ENCOUNTER — Encounter: Payer: Self-pay | Admitting: Family Medicine

## 2013-01-19 VITALS — BP 133/78 | HR 75 | Temp 99.0°F | Ht 64.0 in | Wt 162.7 lb

## 2013-01-19 DIAGNOSIS — J309 Allergic rhinitis, unspecified: Secondary | ICD-10-CM

## 2013-01-19 DIAGNOSIS — R1032 Left lower quadrant pain: Secondary | ICD-10-CM

## 2013-01-19 DIAGNOSIS — J069 Acute upper respiratory infection, unspecified: Secondary | ICD-10-CM

## 2013-01-19 MED ORDER — AMOXICILLIN 875 MG PO TABS: 875.0000 mg | ORAL_TABLET | Freq: Two times a day (BID) | ORAL | Status: DC

## 2013-01-19 NOTE — Progress Notes (Signed)
Subjective:    Wendy Mosley is a 62 y.o. female who presents to [facility] today for URI symptoms plus cough:  1.  URI symptoms:  Present for past 1 month.  Describes rhinorrhea, sinus congestion, cough that is non-productive.  Has tried multiple OTC meds without relief.  Sick contacts are none.  Subjective fevers.  Mild nausea without vomiting since feeling sick, none with abdomen pain (see below)     2.  Abdominal pain:  Describes some lower abdominal cramping along with long-standing reflux.  Present for several months.  Has been eating mostly vegetables and fruits, some meats.  No changes in bowel habits, 1 soft BM daily.  No melena.  Trying to cut out varoius foods and see if any improvement, none thus far.  Describes as dull, aching, 3/10 in nature.  Waves hand generally over stomach when asked where pain occurs.  No nausea/vomiting.     Prev health:  Currently overdue for colonoscopy.  The following portions of the patient's history were reviewed and updated as appropriate: allergies, current medications, past medical history, family and social history, and problem list. Patient is a nonsmoker.    PMH reviewed.  Past Medical History  Diagnosis Date  . Heart attack 2008  . Irregular heart beat 1973  . GERD (gastroesophageal reflux disease) 2006  . Diverticulitis    Past Surgical History  Procedure Laterality Date  . Cholecystectomy  1975  . Appendectomy  1967  . Tubal ligation  1977  . External ear surgery  1974    Medications reviewed. Current Outpatient Prescriptions  Medication Sig Dispense Refill  . aspirin 81 MG EC tablet Take 1 tablet (81 mg total) by mouth daily. Swallow whole.  30 tablet  12  . clobetasol cream (TEMOVATE) 0.05 % Apply topically to affected gyn area once or twice a day  60 g  0  . clotrimazole-betamethasone (LOTRISONE) cream Apply to axilla once or twice daily as needed for itching and rash  45 g  5  . omeprazole (PRILOSEC) 40 MG capsule Take 1  capsule (40 mg total) by mouth daily.  30 capsule  1  . triamcinolone (NASACORT) 55 MCG/ACT nasal inhaler Place 2 sprays into the nose daily.  1 Inhaler  12   No current facility-administered medications for this visit.    ROS as above otherwise neg.  No chest pain, palpitations, SOB, Fever, Chills, Abd pain, N/V/D.   Objective:   Physical Exam BP 133/78  Pulse 75  Temp(Src) 99 F (37.2 C) (Oral)  Ht 5\' 4"  (1.626 m)  Wt 162 lb 11.2 oz (73.8 kg)  BMI 27.91 kg/m2 Gen:  Patient sitting on exam table, appears stated age in no acute distress Head: Normocephalic atraumatic Eyes: EOMI, PERRL, sclera and conjunctiva non-erythematous Ears:  Canals clear bilaterally.  TMs pearly gray bilaterally without erythema or bulging.   Nose:  Nasal turbinates grossly enlarged bilaterally. Some exudates noted. Tender to palpation of maxillary sinus  Mouth: Mucosa membranes moist. Tonsils +2, nonenlarged, non-erythematous. Neck: No cervical lymphadenopathy noted Heart:  RRR Pulm:  Clear to auscultation bilaterally  Abdomen:  Soft/ND.  Minimally tender to deep palpation LLQ.  No masses/guarding or rebound.    No results found for this or any previous visit (from the past 72 hour(s)).

## 2013-01-19 NOTE — Patient Instructions (Signed)
Take the Amoxicillin twice daily for the next 7 days.  Contact the MAP program and for the colonoscopy.    You should start feeling better in about a week or so.  If not, let me know.

## 2013-01-20 DIAGNOSIS — R1032 Left lower quadrant pain: Secondary | ICD-10-CM | POA: Insufficient documentation

## 2013-01-20 DIAGNOSIS — J069 Acute upper respiratory infection, unspecified: Secondary | ICD-10-CM | POA: Insufficient documentation

## 2013-01-20 NOTE — Assessment & Plan Note (Signed)
Cannot provide Nasocort.  Recommended to contact MAP program to help with medication assistance.

## 2013-01-20 NOTE — Assessment & Plan Note (Signed)
Mild in nature.  No red flags. Recommended to repeat colonoscopy for further eval (she is overdue).   Red flags provided.

## 2013-01-20 NOTE — Assessment & Plan Note (Signed)
Sounds like "second sickening" treat with Amox.  FU in 14 days to assess for improvement.  Sooner if worsening.  See allergic rhinitis above.

## 2013-08-23 ENCOUNTER — Emergency Department (INDEPENDENT_AMBULATORY_CARE_PROVIDER_SITE_OTHER)
Admission: EM | Admit: 2013-08-23 | Discharge: 2013-08-23 | Disposition: A | Discharge status: Home / Self Care | Payer: Self-pay | Source: Home / Self Care

## 2013-08-23 ENCOUNTER — Encounter (HOSPITAL_COMMUNITY): Payer: Self-pay | Admitting: Emergency Medicine

## 2013-08-23 DIAGNOSIS — S46811A Strain of other muscles, fascia and tendons at shoulder and upper arm level, right arm, initial encounter: Secondary | ICD-10-CM

## 2013-08-23 DIAGNOSIS — S43499A Other sprain of unspecified shoulder joint, initial encounter: Secondary | ICD-10-CM

## 2013-08-23 DIAGNOSIS — T148XXA Other injury of unspecified body region, initial encounter: Secondary | ICD-10-CM

## 2013-08-23 DIAGNOSIS — S46819A Strain of other muscles, fascia and tendons at shoulder and upper arm level, unspecified arm, initial encounter: Secondary | ICD-10-CM

## 2013-08-23 MED ORDER — TRAMADOL HCL 50 MG PO TABS: 50.0000 mg | ORAL_TABLET | Freq: Four times a day (QID) | ORAL | Status: DC | PRN

## 2013-08-23 MED ORDER — DICLOFENAC POTASSIUM 50 MG PO TABS: 50.0000 mg | ORAL_TABLET | Freq: Three times a day (TID) | ORAL | Status: DC

## 2013-08-23 MED ORDER — DICLOFENAC SODIUM 1 % TD GEL: 1.0000 "application " | Freq: Four times a day (QID) | TRANSDERMAL | Status: DC
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
## 2013-08-24 NOTE — ED Provider Notes (Signed)
Medical screening examination/treatment/procedure(s) were performed by a resident physician or non-physician practitioner and as the supervising physician I was immediately available for consultation/collaboration.  Lynne Leader, MD    Gregor Hams, MD 08/24/13 [phone]

## 2013-11-02 ENCOUNTER — Ambulatory Visit: Payer: Self-pay | Admitting: Family Medicine

## 2013-11-23 ENCOUNTER — Ambulatory Visit: Payer: Self-pay

## 2014-01-01 ENCOUNTER — Encounter (HOSPITAL_COMMUNITY): Payer: Self-pay | Admitting: Emergency Medicine

## 2014-12-10 ENCOUNTER — Encounter (HOSPITAL_COMMUNITY): Payer: Self-pay

## 2014-12-10 ENCOUNTER — Emergency Department (HOSPITAL_COMMUNITY)
Admission: EM | Admit: 2014-12-10 | Discharge: 2014-12-10 | Disposition: A | Discharge status: Home / Self Care | Payer: No Typology Code available for payment source | Attending: Emergency Medicine | Admitting: Emergency Medicine

## 2014-12-10 ENCOUNTER — Emergency Department (HOSPITAL_COMMUNITY): Payer: No Typology Code available for payment source

## 2014-12-10 DIAGNOSIS — K219 Gastro-esophageal reflux disease without esophagitis: Secondary | ICD-10-CM | POA: Insufficient documentation

## 2014-12-10 DIAGNOSIS — R079 Chest pain, unspecified: Secondary | ICD-10-CM | POA: Insufficient documentation

## 2014-12-10 DIAGNOSIS — R51 Headache: Secondary | ICD-10-CM | POA: Diagnosis not present

## 2014-12-10 DIAGNOSIS — R519 Headache, unspecified: Secondary | ICD-10-CM

## 2014-12-10 DIAGNOSIS — Z79899 Other long term (current) drug therapy: Secondary | ICD-10-CM | POA: Insufficient documentation

## 2014-12-10 DIAGNOSIS — Z7982 Long term (current) use of aspirin: Secondary | ICD-10-CM | POA: Insufficient documentation

## 2014-12-10 DIAGNOSIS — Z87891 Personal history of nicotine dependence: Secondary | ICD-10-CM | POA: Insufficient documentation

## 2014-12-10 LAB — TROPONIN I: Troponin I: <0.03 ng/mL (ref <0.031)

## 2014-12-10 LAB — CBC WITH DIFFERENTIAL/PLATELET
Basophils Absolute: 0 10*3/uL (ref 0.0–0.1)
Basophils Relative: 0 %
Eosinophils Absolute: 0.1 10*3/uL (ref 0.0–0.7)
Eosinophils Relative: 2 %
HCT: 34.6 % — ABNORMAL LOW (ref 36.0–46.0)
Hemoglobin: 10.7 g/dL — ABNORMAL LOW (ref 12.0–15.0)
Lymphocytes Relative: 43 %
Lymphs Abs: 2.2 10*3/uL (ref 0.7–4.0)
MCH: 22.7 pg — ABNORMAL LOW (ref 26.0–34.0)
MCHC: 30.9 g/dL (ref 30.0–36.0)
MCV: 73.5 fL — ABNORMAL LOW (ref 78.0–100.0)
Monocytes Absolute: 0.3 10*3/uL (ref 0.1–1.0)
Monocytes Relative: 7 %
Neutro Abs: 2.5 10*3/uL (ref 1.7–7.7)
Neutrophils Relative %: 49 %
Platelets: 302 10*3/uL (ref 150–400)
RBC: 4.71 MIL/uL (ref 3.87–5.11)
RDW: 15.5 % (ref 11.5–15.5)
WBC: 5.1 10*3/uL (ref 4.0–10.5)

## 2014-12-10 LAB — I-STAT TROPONIN, ED: Troponin i, poc: 0 ng/mL (ref 0.00–0.08)

## 2014-12-10 LAB — BASIC METABOLIC PANEL
Anion gap: 9 (ref 5–15)
BUN: 12 mg/dL (ref 6–20)
CO2: 25 mmol/L (ref 22–32)
Calcium: 8.6 mg/dL — ABNORMAL LOW (ref 8.9–10.3)
Chloride: 103 mmol/L (ref 101–111)
Creatinine, Ser: 0.92 mg/dL (ref 0.44–1.00)
GFR calc Af Amer: >60 mL/min (ref >60)
GFR calc non Af Amer: >60 mL/min (ref >60)
Glucose, Bld: 94 mg/dL (ref 65–99)
Potassium: 3.3 mmol/L — ABNORMAL LOW (ref 3.5–5.1)
Sodium: 137 mmol/L (ref 135–145)

## 2014-12-10 MED ORDER — KETOROLAC TROMETHAMINE 15 MG/ML IJ SOLN
15.0000 mg | Freq: Once | INTRAMUSCULAR | Status: AC
  Administered 2014-12-10: 15 mg via INTRAVENOUS
  Filled 2014-12-10: qty 1

## 2014-12-10 NOTE — ED Provider Notes (Signed)
CSN: [id number]     Arrival date & time 12/10/14  1805 History   First MD Initiated Contact with Patient 12/10/14 1807     Chief Complaint  Patient presents with  . Chest Pain   HPI   64 year old female presents today with chest and head pain. Patient reports around 5 PM she was at work at [employer] when she felt a dull slow onset of pain in the right anterior forehead. She reports this pain is sharp, no radiation of symptoms, was brief but now has episodic return of the dull pain. She denies any changes in smell vision or taste, any focal neurological deficits, any neck stiffness, fever, trauma to the head. Patient reports shortly after the head pain she started having left anterior chest pain with radiation into the right shoulder and arm. She reports it was achy, it only lasted a couple minutes. During my evaluation she reports that she still has some vague left anterior chest pain, no radiation down into her arm. She denies any nausea, vomiting, dizziness, diaphoresis, SOB, abdominal pain, lower extremity swelling or edema. Patient reports that she does not smoke or use drugs, no history of malignancy, history of DVT PE, unilateral leg swelling. Patient does report that she had 2 teeth removed last week.   Past Medical History  Diagnosis Date  . Heart attack ([place]) 2008  . Irregular heart beat 1973  . GERD (gastroesophageal reflux disease) 2006  . Diverticulitis    Past Surgical History  Procedure Laterality Date  . Cholecystectomy  1975  . Appendectomy  1967  . Tubal ligation  1977  . External ear surgery  1974   Family History  Problem Relation Age of Onset  . Asthma Mother   . Heart disease Mother   . Diabetes Sister   . Stroke Mother   . Cancer Maternal Grandmother     Lukemia   Social History  Substance Use Topics  . Smoking status: Former Smoker -- 1.00 packs/day for 30 years    Types: Cigarettes    Quit date: 02/01/2011  . Smokeless tobacco:  None  . Alcohol Use: Yes     Comment: occasional glass of wine   OB History    Obstetric Comments   Last Period - 64 yo No post menopausal bleeding No abnormal PAP smears reported - Unsure.     Review of Systems  All other systems reviewed and are negative.   Allergies  Sulfonamide derivatives  Home Medications   Prior to Admission medications   Medication Sig Start Date End Date Taking? Authorizing Provider  amoxicillin (AMOXIL) 500 MG tablet Take 500 mg by mouth 3 (three) times daily. Patient sates she has one more dose to take before completing course of medication as of 12-10-14   Yes Historical Provider, MD  aspirin 81 MG tablet Take 81 mg by mouth daily.   Yes Historical Provider, MD  diclofenac (CATAFLAM) 50 MG tablet Take 1 tablet (50 mg total) by mouth 3 (three) times daily. Prn pain 08/23/13  Yes Janne Napoleon, NP  diclofenac sodium (VOLTAREN) 1 % GEL Apply 1 application topically 4 (four) times daily. 08/23/13  Yes Janne Napoleon, NP  HYDROCODONE BITARTRATE PO Take 1 tablet by mouth daily as needed. Patient states its 300mg  tablet   Yes Historical Provider, MD  aspirin 81 MG EC tablet Take 1 tablet (81 mg total) by mouth daily. Swallow whole. 07/02/11 07/01/12  Gerda Diss, MD  clotrimazole-betamethasone (LOTRISONE) cream  Apply to axilla once or twice daily as needed for itching and rash Patient not taking: Reported on 12/10/2014 10/27/12   Dickie La, MD  omeprazole (PRILOSEC) 40 MG capsule Take 1 capsule (40 mg total) by mouth daily. 07/02/11 07/01/12  Gerda Diss, MD  traMADol (ULTRAM) 50 MG tablet Take 1 tablet (50 mg total) by mouth every 6 (six) hours as needed. Patient not taking: Reported on 12/10/2014 08/23/13   Janne Napoleon, NP  triamcinolone (NASACORT) 55 MCG/ACT nasal inhaler Place 2 sprays into the nose daily. Patient not taking: Reported on 12/10/2014 10/19/12   Gerda Diss, MD   BP 120/72 mmHg  Pulse 63  Temp(Src) 98 F (36.7 C)  Resp 14  SpO2 96%    Physical Exam  Constitutional: She is oriented to person, place, and time. She appears well-developed and well-nourished.  HENT:  Head: Normocephalic and atraumatic.  Eyes: Conjunctivae are normal. Pupils are equal, round, and reactive to light. Right eye exhibits no discharge. Left eye exhibits no discharge. No scleral icterus.  Neck: Normal range of motion. No JVD present. No tracheal deviation present.  Cardiovascular: Normal rate, regular rhythm, normal heart sounds and intact distal pulses.  Exam reveals no gallop and no friction rub.   No murmur heard. Pulmonary/Chest: Effort normal and breath sounds normal. No stridor. No respiratory distress. She has no wheezes. She has no rales. She exhibits no tenderness.  No tenderness to palpation of anterior chest wall, no signs of trauma  Musculoskeletal: Normal range of motion. She exhibits no edema or tenderness.  No lower extremity swelling or edema  Neurological: She is alert and oriented to person, place, and time. Coordination normal.  Skin: Skin is warm and dry.  Psychiatric: She has a normal mood and affect. Her behavior is normal. Judgment and thought content normal.  Nursing note and vitals reviewed.     ED Course  Procedures (including critical care time) Labs Review Labs Reviewed  CBC WITH DIFFERENTIAL/PLATELET - Abnormal; Notable for the following:    Hemoglobin 10.7 (*)    HCT 34.6 (*)    MCV 73.5 (*)    MCH 22.7 (*)    All other components within normal limits  BASIC METABOLIC PANEL - Abnormal; Notable for the following:    Potassium 3.3 (*)    Calcium 8.6 (*)    All other components within normal limits  TROPONIN I  Randolm Idol, ED    Imaging Review Dg Chest 2 View  12/10/2014   CLINICAL DATA:  64 year old female with left chest pain for 1 day. Initial encounter.  EXAM: CHEST  2 VIEW  COMPARISON:  Chest x-ray report 01/04/2011 (no images available).  FINDINGS: Normal cardiac size and mediastinal contours.  Lung volumes are within normal limits. No pneumothorax, pulmonary edema, pleural effusion or confluent pulmonary opacity. Stable cholecystectomy clips. No acute osseous abnormality identified.  IMPRESSION: No acute cardiopulmonary abnormality.   Electronically Signed   By: Genevie Ann M.D.   On: 12/10/2014 19:28   I have personally reviewed and evaluated these images and lab results as part of my medical decision-making.   EKG Interpretation   Date/Time:  Monday December 10 2014 18:16:12 EDT Ventricular Rate:  62 PR Interval:  169 QRS Duration: 88 QT Interval:  445 QTC Calculation: 452 R Axis:   67 Text Interpretation:  Sinus rhythm Low voltage, precordial leads  Borderline T abnormalities, diffuse leads Confirmed by KNOTT MD, DANIEL  ([id number]) on 12/10/2014 6:35:49 PM  MDM   Final diagnoses:  Acute nonintractable headache, unspecified headache type  Chest pain, unspecified chest pain type    Labs: CBC, BMP, i-STAT troponin 2  Imaging: DG chest- no acute cardiopulmonary abnormality  Consults:  Therapeutics: Toradol  Discharge Meds:   Assessment/Plan: Patient presents with headache and left anterior chest pain. At some mild evaluation symptoms were very minimal almost nonexistent, patient was treated here in the ED with Toradol with symptom resolution. Patient has a hard score of 2, with initial negative troponin. Patient appears in no acute distress, laughing and smiling, resting comfortably in exam bed. Doubt ACS, delta troponin. Patient has a well score of 1.5 due to recent tooth extraction, very low suspicion for pulmonary embolism. Patient's vital signs and laboratory data are reassuring. Due to patient's symptomatic improvement, negative troponin, and no ST changes on EKG patient appears that she will be stable for discharge pending repeat troponin. She also had a headache right anterior, no red flags, resolved with Toradol; patient denied actual head pain but did admit to  some uncomfortableness. Patient's care was signed out to oncoming provider Antonietta Breach PA-C pending repeat troponin. Patient was instructed to contact her primary care provider inform them of her visit today. She is instructed to follow-up tomorrow for reevaluation. If symptoms return she is instructed to return to the emergency room for further evaluation and management. Both the patient and her husband understood and agreed to today's plan and had no further questions or concerns at time of discharge         Okey Regal, PA-C 12/11/14 [place]  Leo Grosser, MD 12/11/14 1718

## 2014-12-10 NOTE — Discharge Instructions (Signed)
If symptoms return please return to the emergency if new or worsening signs or symptoms present. Please contact [facility] and wellness for follow-up evaluation.

## 2014-12-10 NOTE — ED Notes (Signed)
Per EMS: Pt began experiencing L sided chest pain. Describes it as sharp and radiating down to L arm. Pt received 324 ASA and nitro x4 by EMS. Pt had some teeth removed from R side recently. BP 138/78, 62BPM, 94%RA.

## 2014-12-10 NOTE — ED Provider Notes (Signed)
2225 - Patient care assumed from [facility], PA-C at shift change. Patient presenting of chest pain. Plan discussed with Hedges, PA-C which includes d/c if troponin negative x 2. Laboratory work up reviewed; noncontributory. Second troponin is negative. Patient stable for discharge at this time. Return precautions given.   Filed Vitals:   12/10/14 2130 12/10/14 2145 12/10/14 2200 12/10/14 2215  BP: 161/75 114/71 132/70 120/72  Pulse: 67 59 60 63  Temp:      Resp: 15 14 20 14   SpO2: 97% 96% 96% 96%    Results for orders placed or performed during the hospital encounter of 12/10/14  CBC with Differential  Result Value Ref Range   WBC 5.1 4.0 - 10.5 K/uL   RBC 4.71 3.87 - 5.11 MIL/uL   Hemoglobin 10.7 (L) 12.0 - 15.0 g/dL   HCT 34.6 (L) 36.0 - 46.0 %   MCV 73.5 (L) 78.0 - 100.0 fL   MCH 22.7 (L) 26.0 - 34.0 pg   MCHC 30.9 30.0 - 36.0 g/dL   RDW 15.5 11.5 - 15.5 %   Platelets 302 150 - 400 K/uL   Neutrophils Relative % 49 %   Neutro Abs 2.5 1.7 - 7.7 K/uL   Lymphocytes Relative 43 %   Lymphs Abs 2.2 0.7 - 4.0 K/uL   Monocytes Relative 7 %   Monocytes Absolute 0.3 0.1 - 1.0 K/uL   Eosinophils Relative 2 %   Eosinophils Absolute 0.1 0.0 - 0.7 K/uL   Basophils Relative 0 %   Basophils Absolute 0.0 0.0 - 0.1 K/uL  Basic metabolic panel  Result Value Ref Range   Sodium 137 135 - 145 mmol/L   Potassium 3.3 (L) 3.5 - 5.1 mmol/L   Chloride 103 101 - 111 mmol/L   CO2 25 22 - 32 mmol/L   Glucose, Bld 94 65 - 99 mg/dL   BUN 12 6 - 20 mg/dL   Creatinine, Ser 0.92 0.44 - 1.00 mg/dL   Calcium 8.6 (L) 8.9 - 10.3 mg/dL   GFR calc non Af Amer >60 >60 mL/min   GFR calc Af Amer >60 >60 mL/min   Anion gap 9 5 - 15  Troponin I  Result Value Ref Range   Troponin I <0.03 <0.031 ng/mL  I-stat troponin, ED  Result Value Ref Range   Troponin i, poc 0.00 0.00 - 0.08 ng/mL   Comment 3           Dg Chest 2 View  12/10/2014   CLINICAL DATA:  64 year old female with left chest pain for 1 day.  Initial encounter.  EXAM: CHEST  2 VIEW  COMPARISON:  Chest x-ray report 01/04/2011 (no images available).  FINDINGS: Normal cardiac size and mediastinal contours. Lung volumes are within normal limits. No pneumothorax, pulmonary edema, pleural effusion or confluent pulmonary opacity. Stable cholecystectomy clips. No acute osseous abnormality identified.  IMPRESSION: No acute cardiopulmonary abnormality.   Electronically Signed   By: Genevie Ann M.D.   On: 12/10/2014 19:28      Antonietta Breach, PA-C 12/10/14 2225  Leo Grosser, MD 12/11/14 Laureen Abrahams

## 2014-12-11 ENCOUNTER — Encounter (HOSPITAL_BASED_OUTPATIENT_CLINIC_OR_DEPARTMENT_OTHER): Payer: Self-pay | Admitting: Emergency Medicine

## 2015-08-21 ENCOUNTER — Ambulatory Visit: Payer: No Typology Code available for payment source | Admitting: Internal Medicine

## 2015-11-12 ENCOUNTER — Encounter (INDEPENDENT_AMBULATORY_CARE_PROVIDER_SITE_OTHER): Payer: Self-pay

## 2015-11-12 ENCOUNTER — Encounter: Payer: Self-pay | Admitting: Family Medicine

## 2015-11-12 ENCOUNTER — Ambulatory Visit: Payer: Medicare Other | Attending: Family Medicine | Admitting: Family Medicine

## 2015-11-12 VITALS — BP 152/90 | HR 68 | Temp 97.7°F | Wt 161.0 lb

## 2015-11-12 DIAGNOSIS — E785 Hyperlipidemia, unspecified: Secondary | ICD-10-CM

## 2015-11-12 DIAGNOSIS — Z23 Encounter for immunization: Secondary | ICD-10-CM | POA: Diagnosis not present

## 2015-11-12 DIAGNOSIS — Z87891 Personal history of nicotine dependence: Secondary | ICD-10-CM | POA: Insufficient documentation

## 2015-11-12 DIAGNOSIS — Z114 Encounter for screening for human immunodeficiency virus [HIV]: Secondary | ICD-10-CM | POA: Diagnosis not present

## 2015-11-12 DIAGNOSIS — L309 Dermatitis, unspecified: Secondary | ICD-10-CM | POA: Diagnosis not present

## 2015-11-12 DIAGNOSIS — M1711 Unilateral primary osteoarthritis, right knee: Secondary | ICD-10-CM | POA: Insufficient documentation

## 2015-11-12 DIAGNOSIS — Z1159 Encounter for screening for other viral diseases: Secondary | ICD-10-CM

## 2015-11-12 DIAGNOSIS — M129 Arthropathy, unspecified: Secondary | ICD-10-CM

## 2015-11-12 DIAGNOSIS — I252 Old myocardial infarction: Secondary | ICD-10-CM | POA: Insufficient documentation

## 2015-11-12 DIAGNOSIS — I10 Essential (primary) hypertension: Secondary | ICD-10-CM | POA: Insufficient documentation

## 2015-11-12 LAB — LIPID PANEL
Cholesterol: 213 mg/dL — ABNORMAL HIGH (ref 125–200)
HDL: 47 mg/dL (ref >=46)
LDL Cholesterol: 149 mg/dL — ABNORMAL HIGH (ref <130)
Total CHOL/HDL Ratio: 4.5 Ratio (ref <=5.0)
Triglycerides: 85 mg/dL (ref <150)
VLDL: 17 mg/dL (ref <30)

## 2015-11-12 LAB — COMPLETE METABOLIC PANEL WITH GFR
ALT: 26 U/L (ref 6–29)
AST: 25 U/L (ref 10–35)
Albumin: 4.3 g/dL (ref 3.6–5.1)
Alkaline Phosphatase: 116 U/L (ref 33–130)
BUN: 11 mg/dL (ref 7–25)
CO2: 29 mmol/L (ref 20–31)
Calcium: 9.6 mg/dL (ref 8.6–10.4)
Chloride: 105 mmol/L (ref 98–110)
Creat: 0.78 mg/dL (ref 0.50–0.99)
GFR, Est African American: >89 mL/min (ref >=60)
GFR, Est Non African American: 80 mL/min (ref >=60)
Glucose, Bld: 88 mg/dL (ref 65–99)
Potassium: 4.5 mmol/L (ref 3.5–5.3)
Sodium: 141 mmol/L (ref 135–146)
Total Bilirubin: 0.7 mg/dL (ref 0.2–1.2)
Total Protein: 7.4 g/dL (ref 6.1–8.1)

## 2015-11-12 MED ORDER — DICLOFENAC SODIUM 1 % TD GEL: 2.0000 g | Freq: Four times a day (QID) | TRANSDERMAL | 5 refills | Status: DC

## 2015-11-12 MED ORDER — HYDROCHLOROTHIAZIDE 12.5 MG PO CAPS: 12.5000 mg | ORAL_CAPSULE | Freq: Every day | ORAL | 3 refills | Status: DC

## 2015-11-12 MED ORDER — TRIAMCINOLONE ACETONIDE 0.5 % EX OINT: 1.0000 "application " | TOPICAL_OINTMENT | Freq: Two times a day (BID) | CUTANEOUS | 0 refills | Status: DC

## 2015-11-12 NOTE — Progress Notes (Signed)
allergic reaction to amlodopine

## 2015-11-12 NOTE — Assessment & Plan Note (Signed)
Assessment  HTN with intolerance of norvasc although I do not believe norvasc is source of localized rash  Plan HCTZ

## 2015-11-12 NOTE — Patient Instructions (Addendum)
[facility] was seen today for establish care.  Diagnoses and all orders for this visit:  Screening for HIV (human immunodeficiency virus) -     HIV antibody (with reflex)  Essential hypertension -     COMPLETE METABOLIC PANEL WITH GFR -     Lipid Panel -     hydrochlorothiazide (MICROZIDE) 12.5 MG capsule; Take 1 capsule (12.5 mg total) by mouth daily.  Need for hepatitis C screening test -     Hepatitis C antibody, reflex  Arthritis of right knee  Dermatitis -     triamcinolone ointment (KENALOG) 0.5 %; Apply 1 application topically 2 (two) times daily.   F/u in 4 weeks for pap smear, pelvic rash, breast exam  Dr. Adrian Blackwater

## 2015-11-12 NOTE — Assessment & Plan Note (Signed)
A: R knee swelling and pain suspect OA P: Continue voltaren gel and ibuprofen Plan for x-ray Plan for steroid shot prn

## 2015-11-12 NOTE — Progress Notes (Signed)
LOGO@  Subjective:  Patient ID: Wendy Mosley, female    DOB: [birth year]  Age: 65 y.o. MRN: [id number]  CC: Establish Care   HPI Wendy Mosley presents for  She   1. CHRONIC HYPERTENSION diagnosed with HTN last year. She has episode of high BP, high heart rate, feeling faint. She has been treated with amlodipine in the past she developed rash on arms and neck. She last took amlodipine about 4 weeks ago.   Disease Monitoring  Blood pressure range: not checking   Chest pain: no   Dyspnea: no   Claudication: no   Medication compliance: no  Medication Side Effects  Lightheadedness: no   Urinary frequency: no   Edema: no     Preventitive Healthcare:  Exercise: no   Diet Pattern:   Salt Restriction: yes   2. Rash: on arms and neck on and off for two years. Worsens while taking amlodipine. It is itchy. She is using clotrimazole and betamethasone cream. Rash occurs all year around. No known triggers. No fever or weight loss associated with rash.   3. R knee and calf pain: for past 2 weeks. She has a fall over month ago while leaving work. She feel onto her L knee took most of the impact. She skinned her knee. Now only her R knee is bothering and calf. Swelling in knee has improved.   Past Medical History:  Diagnosis Date  . Diverticulitis   . GERD (gastroesophageal reflux disease) 2006  . Heart attack ([place]) 2008  . Irregular heart beat 1973    Past Surgical History:  Procedure Laterality Date  . APPENDECTOMY  1967  . CHOLECYSTECTOMY  1975  . EXTERNAL EAR SURGERY  1974  . TUBAL LIGATION  1977    Family History  Problem Relation Age of Onset  . Asthma Mother   . Heart disease Mother   . Diabetes Sister   . Stroke Mother   . Cancer Maternal Grandmother     Lukemia    Social History  Substance Use Topics  . Smoking status: Former Smoker    Packs/day: 1.00    Years: 30.00    Types: Cigarettes    Quit date: 02/01/2011  . Smokeless tobacco: Not on file  .  Alcohol use Yes     Comment: occasional glass of wine    ROS Review of Systems  Constitutional: Negative for chills and fever.  Eyes: Negative for visual disturbance.  Respiratory: Negative for shortness of breath.   Cardiovascular: Negative for chest pain.  Gastrointestinal: Negative for abdominal pain and blood in stool.  Musculoskeletal: Positive for arthralgias. Negative for back pain.  Skin: Negative for rash.  Allergic/Immunologic: Negative for immunocompromised state.  Hematological: Negative for adenopathy. Does not bruise/bleed easily.  Psychiatric/Behavioral: Negative for dysphoric mood and suicidal ideas.    Objective:   Today's Vitals: BP (!) 152/90 (BP Location: Left Arm, Patient Position: Sitting, Cuff Size: Small)   Pulse 68   Temp 97.7 F (36.5 C) (Oral)   Wt 161 lb (73 kg)   SpO2 99%   BMI 27.64 kg/m  BP Readings from Last 3 Encounters:  11/12/15 (!) 152/90  12/10/14 120/72  08/23/13 156/61    Physical Exam  Constitutional: She is oriented to person, place, and time. She appears well-developed and well-nourished. No distress.  HENT:  Head: Normocephalic and atraumatic.  Cardiovascular: Normal rate, regular rhythm, normal heart sounds and intact distal pulses.   Pulmonary/Chest: Effort normal and breath sounds normal.  Musculoskeletal:  She exhibits no edema.       Right knee: She exhibits swelling (medial knee ). She exhibits normal range of motion, no effusion, no ecchymosis, no deformity, no laceration, no erythema, normal alignment, no LCL laxity, normal patellar mobility, no bony tenderness, normal meniscus and no MCL laxity. No tenderness found. No medial joint line, no lateral joint line, no MCL, no LCL and no patellar tendon tenderness noted.  Neurological: She is alert and oriented to person, place, and time.  Skin: Skin is warm and dry. No rash noted.     Psychiatric: She has a normal mood and affect.    Assessment & Plan:   Problem List  Items Addressed This Visit      High   HTN (hypertension)   Relevant Medications   hydrochlorothiazide (MICROZIDE) 12.5 MG capsule   Other Relevant Orders   COMPLETE METABOLIC PANEL WITH GFR   Lipid Panel   Arthritis of right knee   Relevant Medications   diclofenac sodium (VOLTAREN) 1 % GEL     Medium   Dermatitis (Chronic)   Relevant Medications   triamcinolone ointment (KENALOG) 0.5 %    Other Visit Diagnoses    Screening for HIV (human immunodeficiency virus)    -  Primary   Relevant Orders   HIV antibody (with reflex)   Need for hepatitis C screening test       Relevant Orders   Hepatitis C antibody, reflex   Encounter for immunization       Relevant Orders   Flu Vaccine QUAD 36+ mos IM (Completed)      Outpatient Encounter Prescriptions as of 11/12/2015  Medication Sig  . aspirin 81 MG tablet Take 81 mg by mouth daily.  . clotrimazole-betamethasone (LOTRISONE) cream Apply to axilla once or twice daily as needed for itching and rash  . diclofenac sodium (VOLTAREN) 1 % GEL Apply 1 application topically 4 (four) times daily.  [name] amoxicillin (AMOXIL) 500 MG tablet Take 500 mg by mouth 3 (three) times daily. Patient sates she has one more dose to take before completing course of medication as of 12-10-14  . aspirin 81 MG EC tablet Take 1 tablet (81 mg total) by mouth daily. Swallow whole.  . diclofenac (CATAFLAM) 50 MG tablet Take 1 tablet (50 mg total) by mouth 3 (three) times daily. Prn pain (Patient not taking: Reported on 11/12/2015)  . HYDROCODONE BITARTRATE PO Take 1 tablet by mouth daily as needed. Patient states its 300mg  tablet  . omeprazole (PRILOSEC) 40 MG capsule Take 1 capsule (40 mg total) by mouth daily.  . traMADol (ULTRAM) 50 MG tablet Take 1 tablet (50 mg total) by mouth every 6 (six) hours as needed. (Patient not taking: Reported on 12/10/2014)  . triamcinolone (NASACORT) 55 MCG/ACT nasal inhaler Place 2 sprays into the nose daily. (Patient not taking:  Reported on 12/10/2014)   No facility-administered encounter medications on file as of 11/12/2015.     Follow-up: Return in about 4 weeks (around 12/10/2015) for pap and pelvic .    Boykin Nearing MD

## 2015-11-12 NOTE — Assessment & Plan Note (Signed)
Dermatitis R arm chronic There is no evidence of fungus  Plan: Kenalog Keep skin well hydrated

## 2015-11-13 LAB — HIV ANTIBODY (ROUTINE TESTING W REFLEX): HIV 1&2 Ab, 4th Generation: NONREACTIVE

## 2015-11-13 LAB — HEPATITIS C ANTIBODY: HCV Ab: NEGATIVE

## 2015-11-14 DIAGNOSIS — E785 Hyperlipidemia, unspecified: Secondary | ICD-10-CM | POA: Insufficient documentation

## 2015-11-14 DIAGNOSIS — E1169 Type 2 diabetes mellitus with other specified complication: Secondary | ICD-10-CM | POA: Insufficient documentation

## 2015-11-14 MED ORDER — ATORVASTATIN CALCIUM 40 MG PO TABS: 40.0000 mg | ORAL_TABLET | Freq: Every day | ORAL | 3 refills | Status: DC

## 2015-11-14 NOTE — Addendum Note (Signed)
Addended by: Boykin Nearing on: 11/14/2015 01:26 PM   Modules accepted: Orders

## 2016-01-14 ENCOUNTER — Ambulatory Visit: Payer: Medicare Other | Attending: Family Medicine | Admitting: Family Medicine

## 2016-01-14 ENCOUNTER — Encounter: Payer: Self-pay | Admitting: Family Medicine

## 2016-01-14 ENCOUNTER — Other Ambulatory Visit (HOSPITAL_COMMUNITY)
Admission: RE | Admit: 2016-01-14 | Discharge: 2016-01-14 | Disposition: A | Discharge status: Home / Self Care | Payer: Medicare Other | Source: Ambulatory Visit | Attending: Family Medicine | Admitting: Family Medicine

## 2016-01-14 VITALS — BP 153/89 | HR 76 | Temp 98.0°F | Ht 64.0 in | Wt 162.8 lb

## 2016-01-14 DIAGNOSIS — Z1151 Encounter for screening for human papillomavirus (HPV): Secondary | ICD-10-CM | POA: Insufficient documentation

## 2016-01-14 DIAGNOSIS — Z1231 Encounter for screening mammogram for malignant neoplasm of breast: Secondary | ICD-10-CM

## 2016-01-14 DIAGNOSIS — L309 Dermatitis, unspecified: Secondary | ICD-10-CM | POA: Diagnosis not present

## 2016-01-14 DIAGNOSIS — Z87891 Personal history of nicotine dependence: Secondary | ICD-10-CM | POA: Diagnosis not present

## 2016-01-14 DIAGNOSIS — Z0000 Encounter for general adult medical examination without abnormal findings: Secondary | ICD-10-CM | POA: Diagnosis not present

## 2016-01-14 DIAGNOSIS — I10 Essential (primary) hypertension: Secondary | ICD-10-CM | POA: Diagnosis not present

## 2016-01-14 DIAGNOSIS — Z113 Encounter for screening for infections with a predominantly sexual mode of transmission: Secondary | ICD-10-CM | POA: Diagnosis not present

## 2016-01-14 DIAGNOSIS — Z01419 Encounter for gynecological examination (general) (routine) without abnormal findings: Secondary | ICD-10-CM | POA: Insufficient documentation

## 2016-01-14 DIAGNOSIS — Z124 Encounter for screening for malignant neoplasm of cervix: Secondary | ICD-10-CM | POA: Diagnosis not present

## 2016-01-14 DIAGNOSIS — Z7982 Long term (current) use of aspirin: Secondary | ICD-10-CM | POA: Insufficient documentation

## 2016-01-14 DIAGNOSIS — N904 Leukoplakia of vulva: Secondary | ICD-10-CM

## 2016-01-14 MED ORDER — HYDROCHLOROTHIAZIDE 25 MG PO TABS: 25.0000 mg | ORAL_TABLET | Freq: Every day | ORAL | 11 refills | Status: DC

## 2016-01-14 MED ORDER — CLOBETASOL PROPIONATE 0.05 % EX CREA: 1.0000 "application " | TOPICAL_CREAM | Freq: Two times a day (BID) | CUTANEOUS | 2 refills | Status: DC

## 2016-01-14 NOTE — Patient Instructions (Addendum)
Wendy Mosley was seen today for gynecologic exam.  Diagnoses and all orders for this visit:  Pap smear for cervical cancer screening -     Cytology - PAP  Essential hypertension -     hydrochlorothiazide (HYDRODIURIL) 25 MG tablet; Take 1 tablet (25 mg total) by mouth daily.  Healthcare maintenance -     Ambulatory referral to Gastroenterology  Visit for screening mammogram -     MM DIGITAL SCREENING BILATERAL; Future  Dermatitis  Lichen sclerosus et atrophicus of the vulva -     clobetasol cream (TEMOVATE) 0.05 %; Apply 1 application topically 2 (two) times daily.   Try changing deodorant to one without perfume and is baking soda based   F/u in 6 weeks for HTN. F/u response to clobetasol  Dr. Adrian Blackwater

## 2016-01-14 NOTE — Assessment & Plan Note (Signed)
Gyn referral Refilled topical steroid

## 2016-01-14 NOTE — Assessment & Plan Note (Signed)
Elevated BP Increase HCTZ to 25 mg daily

## 2016-01-14 NOTE — Progress Notes (Signed)
Subjective:  Patient ID: Shanece Mosley, female    DOB: February 19, 1951  Age: 65 y.o. MRN: [id number]  CC: Gynecologic Exam   HPI [name] presents for  1. Pap: here for screening pap smear.   2. Vulvar itching: She has hx of chronic vulvar itching since at least 2013, responsive to steroids.  She reports itching at area of hair growth. She had a biopsy done in 09/2012 that revealed normal vulvar skin. No intercourse for past 10 years. She is getting married in 08/2016.   2. Breast exam: some soreness in R breast today. She wore a push up bra. No soreness usually. No lumps on self check. No nipple discharge. She has itching in her axilla.  3. HTN: taking HCTZ 12.5 mg daily. Has increased urination. No HA, CP or SOB.    Social History  Substance Use Topics  . Smoking status: Former Smoker    Packs/day: 1.00    Years: 30.00    Types: Cigarettes    Quit date: 02/01/2011  . Smokeless tobacco: Not on file  . Alcohol use Yes     Comment: occasional glass of wine   Outpatient Medications Prior to Visit  Medication Sig Dispense Refill  . aspirin 81 MG tablet Take 81 mg by mouth daily.    . clotrimazole-betamethasone (LOTRISONE) cream Apply to axilla once or twice daily as needed for itching and rash 45 g 5  . diclofenac sodium (VOLTAREN) 1 % GEL Apply 2 g topically 4 (four) times daily. 100 g 5  . hydrochlorothiazide (MICROZIDE) 12.5 MG capsule Take 1 capsule (12.5 mg total) by mouth daily. 30 capsule 3  . triamcinolone ointment (KENALOG) 0.5 % Apply 1 application topically 2 (two) times daily. 30 g 0  . aspirin 81 MG EC tablet Take 1 tablet (81 mg total) by mouth daily. Swallow whole. 30 tablet 12  . atorvastatin (LIPITOR) 40 MG tablet Take 1 tablet (40 mg total) by mouth daily. (Patient not taking: Reported on 01/14/2016) 90 tablet 3  . HYDROCODONE BITARTRATE PO Take 1 tablet by mouth daily as needed. Patient states its 300mg  tablet    . omeprazole (PRILOSEC) 40 MG capsule  Take 1 capsule (40 mg total) by mouth daily. 30 capsule 1   No facility-administered medications prior to visit.     ROS Review of Systems  Constitutional: Negative for chills and fever.  Eyes: Negative for visual disturbance.  Respiratory: Negative for shortness of breath.   Cardiovascular: Negative for chest pain.  Gastrointestinal: Negative for abdominal pain and blood in stool.  Musculoskeletal: Negative for arthralgias and back pain.  Skin: Negative for rash.  Allergic/Immunologic: Negative for immunocompromised state.  Hematological: Negative for adenopathy. Does not bruise/bleed easily.  Psychiatric/Behavioral: Negative for dysphoric mood and suicidal ideas.    Objective:  BP (!) 153/89 (BP Location: Left Arm, Patient Position: Sitting, Cuff Size: Small)   Pulse 76   Temp 98 F (36.7 C) (Oral)   Ht 5\' 4"  (1.626 m)   Wt 162 lb 12.8 oz (73.8 kg)   SpO2 96%   BMI 27.94 kg/m   BP/Weight 01/14/2016 11/12/2015 [id number]  Systolic BP 0000000 0000000 [id number]  Diastolic BP 89 90 72  Wt. (Lbs) 162.8 161 -  BMI 27.94 27.64 -    Physical Exam  Constitutional: She appears well-developed and well-nourished. No distress.  Cardiovascular: Normal rate, regular rhythm, normal heart sounds and intact distal pulses.   Pulmonary/Chest: Effort normal and breath sounds normal. Right breast exhibits tenderness.  Right breast exhibits no inverted nipple, no mass, no nipple discharge and no skin change. Left breast exhibits no inverted nipple, no mass, no nipple discharge, no skin change and no tenderness. Breasts are symmetrical.    Genitourinary: Vagina normal and uterus normal.    Pelvic exam was performed with patient prone. There is no rash, tenderness or lesion on the right labia. There is no rash, tenderness or lesion on the left labia. Cervix exhibits no motion tenderness, no discharge and no friability.  Musculoskeletal: She exhibits no edema.  Lymphadenopathy:       Right: No inguinal  adenopathy present.       Left: No inguinal adenopathy present.  Skin: Skin is warm and dry. No rash noted.    Assessment & Plan:  [name] was seen today for gynecologic exam.  Diagnoses and all orders for this visit:  Pap smear for cervical cancer screening -     Cytology - PAP  Essential hypertension -     hydrochlorothiazide (HYDRODIURIL) 25 MG tablet; Take 1 tablet (25 mg total) by mouth daily.  Healthcare maintenance -     Ambulatory referral to Gastroenterology  Visit for screening mammogram -     MM DIGITAL SCREENING BILATERAL; Future  Dermatitis   There are no diagnoses linked to this encounter.  No orders of the defined types were placed in this encounter.   Follow-up: Return in about 6 weeks (around 02/25/2016) for HTN.   Boykin Nearing MD

## 2016-01-14 NOTE — Progress Notes (Signed)
Pt is here today for a pap and breast exam.   Pt needs refill on BP medication.

## 2016-01-16 LAB — CYTOLOGY - PAP
Chlamydia: NEGATIVE
Diagnosis: NEGATIVE
HPV: NOT DETECTED
Neisseria Gonorrhea: NEGATIVE
Trichomonas: NEGATIVE

## 2016-01-16 LAB — CERVICOVAGINAL ANCILLARY ONLY: Candida vaginitis: NEGATIVE

## 2016-01-21 ENCOUNTER — Encounter: Payer: Self-pay | Admitting: Obstetrics and Gynecology

## 2016-01-21 ENCOUNTER — Telehealth: Payer: Self-pay | Admitting: Family Medicine

## 2016-01-21 NOTE — Telephone Encounter (Signed)
Good Morning  Patient was refer to [facility] for her colonoscopy and Amber call Mrs [name] and she said that had a previous colonoscopy in [place] but don't remember with who and she mention that Dr Adrian Blackwater can see those records can you please, fax it to [facility]  [phone] or they will canceled the referral thank you .

## 2016-01-21 NOTE — Telephone Encounter (Signed)
Wendy Mosley,  I checked EPIC I have no previous colonoscopy records.

## 2016-01-21 NOTE — Telephone Encounter (Signed)
Will route to PCP 

## 2016-01-22 NOTE — Telephone Encounter (Signed)
Good Morning  Dr Adrian Blackwater  Can you check outside epic  Thanks

## 2016-01-22 NOTE — Telephone Encounter (Signed)
Hello Wendy Mosley,  Patient has no outside documents. Documents from outside health systems will show in care everywhere. I have queried care everywhere she does not have records in outside health systems.   The only other option is for patient to go through her records to determine which GI group she used or for [place] to call all the local GI groups, excluding [facility] to determine if they have record for patient and if they do, have patient sign a release for colonoscopy report.

## 2016-02-06 ENCOUNTER — Encounter: Payer: Self-pay | Admitting: Family Medicine

## 2016-02-12 DIAGNOSIS — I10 Essential (primary) hypertension: Secondary | ICD-10-CM | POA: Diagnosis not present

## 2016-02-12 DIAGNOSIS — Z1211 Encounter for screening for malignant neoplasm of colon: Secondary | ICD-10-CM | POA: Diagnosis not present

## 2016-02-12 DIAGNOSIS — K5900 Constipation, unspecified: Secondary | ICD-10-CM | POA: Diagnosis not present

## 2016-02-19 ENCOUNTER — Encounter: Payer: Self-pay | Admitting: Obstetrics and Gynecology

## 2016-02-19 ENCOUNTER — Ambulatory Visit (INDEPENDENT_AMBULATORY_CARE_PROVIDER_SITE_OTHER): Payer: Medicare Other | Admitting: Obstetrics and Gynecology

## 2016-02-19 VITALS — BP 129/76 | HR 80

## 2016-02-19 DIAGNOSIS — L292 Pruritus vulvae: Secondary | ICD-10-CM

## 2016-02-19 MED ORDER — CLOBETASOL PROPIONATE 0.05 % EX OINT: 1.0000 "application " | TOPICAL_OINTMENT | Freq: Two times a day (BID) | CUTANEOUS | 0 refills | Status: DC

## 2016-02-19 NOTE — Progress Notes (Signed)
65 yo presenting today for the evaluation of chronic vulva pruritis. She describes some pruritis only to the vulva close to the rectum. She denies any discharge. She is not sexually active. She has been experiencing symptoms since 2013. She has been treated with steroid cream to the vulva for presumed lichen sclerosis with good results. Patient desires to have answers.  Past Medical History:  Diagnosis Date  . Diverticulitis   . GERD (gastroesophageal reflux disease) 2006  . Heart attack 2008  . Irregular heart beat 1973   Past Surgical History:  Procedure Laterality Date  . APPENDECTOMY  1967  . CHOLECYSTECTOMY  1975  . EXTERNAL EAR SURGERY  1974  . TUBAL LIGATION  1977   Family History  Problem Relation Age of Onset  . Asthma Mother   . Heart disease Mother   . Diabetes Sister   . Stroke Mother   . Cancer Maternal Grandmother     Lukemia   Social History  Substance Use Topics  . Smoking status: Former Smoker    Packs/day: 1.00    Years: 30.00    Types: Cigarettes    Quit date: 02/01/2011  . Smokeless tobacco: Not on file  . Alcohol use Yes     Comment: occasional glass of wine   ROS See pertinent in HPI  Blood pressure 129/76, pulse 80. GENERAL: Well-developed, well-nourished female in no acute distress.  ABDOMEN: Soft, nontender, nondistended. No organomegaly. PELVIC: Normal external female genitalia without excoriation. Vagina is pink and rugated.  Normal discharge. Normal appearing cervix. Uterus is normal in size. No adnexal mass or tenderness. EXTREMITIES: No cyanosis, clubbing, or edema, 2+ distal pulses.  A/P 65 yo here with chronic vulva pruritis - No biopsies performed - Advised to continue the use of clobetasol cream - Patient scheduled for a mammogram later this month - RTC prn

## 2016-02-27 DIAGNOSIS — D175 Benign lipomatous neoplasm of intra-abdominal organs: Secondary | ICD-10-CM | POA: Diagnosis not present

## 2016-02-27 DIAGNOSIS — D120 Benign neoplasm of cecum: Secondary | ICD-10-CM | POA: Diagnosis not present

## 2016-02-27 DIAGNOSIS — K635 Polyp of colon: Secondary | ICD-10-CM | POA: Diagnosis not present

## 2016-02-27 DIAGNOSIS — Z1211 Encounter for screening for malignant neoplasm of colon: Secondary | ICD-10-CM | POA: Diagnosis not present

## 2016-02-27 DIAGNOSIS — K5730 Diverticulosis of large intestine without perforation or abscess without bleeding: Secondary | ICD-10-CM | POA: Diagnosis not present

## 2016-02-27 DIAGNOSIS — D179 Benign lipomatous neoplasm, unspecified: Secondary | ICD-10-CM | POA: Diagnosis not present

## 2016-03-17 ENCOUNTER — Ambulatory Visit
Admission: RE | Admit: 2016-03-17 | Discharge: 2016-03-17 | Disposition: A | Discharge status: Home / Self Care | Payer: Medicare HMO | Source: Ambulatory Visit | Attending: Family Medicine | Admitting: Family Medicine

## 2016-03-17 DIAGNOSIS — Z1231 Encounter for screening mammogram for malignant neoplasm of breast: Secondary | ICD-10-CM

## 2016-07-06 ENCOUNTER — Ambulatory Visit: Payer: Medicare HMO | Attending: Family Medicine | Admitting: Family Medicine

## 2016-07-06 ENCOUNTER — Encounter: Payer: Self-pay | Admitting: Family Medicine

## 2016-07-06 VITALS — BP 124/76 | HR 76 | Temp 97.6°F | Ht 64.0 in | Wt 163.6 lb

## 2016-07-06 DIAGNOSIS — K2100 Gastro-esophageal reflux disease with esophagitis, without bleeding: Secondary | ICD-10-CM

## 2016-07-06 DIAGNOSIS — M1990 Unspecified osteoarthritis, unspecified site: Secondary | ICD-10-CM

## 2016-07-06 DIAGNOSIS — Z87891 Personal history of nicotine dependence: Secondary | ICD-10-CM | POA: Diagnosis not present

## 2016-07-06 DIAGNOSIS — I10 Essential (primary) hypertension: Secondary | ICD-10-CM | POA: Insufficient documentation

## 2016-07-06 DIAGNOSIS — L309 Dermatitis, unspecified: Secondary | ICD-10-CM | POA: Insufficient documentation

## 2016-07-06 DIAGNOSIS — E2839 Other primary ovarian failure: Secondary | ICD-10-CM

## 2016-07-06 DIAGNOSIS — Z79899 Other long term (current) drug therapy: Secondary | ICD-10-CM | POA: Diagnosis not present

## 2016-07-06 DIAGNOSIS — Z7982 Long term (current) use of aspirin: Secondary | ICD-10-CM | POA: Insufficient documentation

## 2016-07-06 DIAGNOSIS — N904 Leukoplakia of vulva: Secondary | ICD-10-CM | POA: Diagnosis not present

## 2016-07-06 DIAGNOSIS — E785 Hyperlipidemia, unspecified: Secondary | ICD-10-CM | POA: Insufficient documentation

## 2016-07-06 MED ORDER — OMEPRAZOLE 40 MG PO CPDR: 40.0000 mg | DELAYED_RELEASE_CAPSULE | Freq: Every day | ORAL | 3 refills | Status: DC

## 2016-07-06 MED ORDER — CLOBETASOL PROPIONATE 0.05 % EX CREA: 1.0000 "application " | TOPICAL_CREAM | Freq: Two times a day (BID) | CUTANEOUS | 5 refills | Status: DC

## 2016-07-06 MED ORDER — TRIAMCINOLONE ACETONIDE 0.5 % EX CREA: 1.0000 "application " | TOPICAL_CREAM | Freq: Two times a day (BID) | CUTANEOUS | 5 refills | Status: DC

## 2016-07-06 MED ORDER — DICLOFENAC SODIUM 1 % TD GEL: 4.0000 g | Freq: Four times a day (QID) | TRANSDERMAL | 11 refills | Status: DC

## 2016-07-06 NOTE — Assessment & Plan Note (Signed)
A: recurrent GERD P: Prilosec ordered GERD diet provided f/u in 8 weeks, GI referral for EGD if symptoms do not improve

## 2016-07-06 NOTE — Patient Instructions (Addendum)
Wendy Mosley was seen today for hypertension.  Diagnoses and all orders for this visit:  Hyperlipidemia, unspecified hyperlipidemia type -     Lipid Panel  Gastroesophageal reflux disease with esophagitis -     omeprazole (PRILOSEC) 40 MG capsule; Take 1 capsule (40 mg total) by mouth daily.  Estrogen deficiency -     DG Bone Density; Future  Lichen sclerosus et atrophicus of the vulva -     clobetasol cream (TEMOVATE) 0.05 %; Apply 1 application topically 2 (two) times daily. Apply to vaginal area  Dermatitis -     triamcinolone cream (KENALOG) 0.5 %; Apply 1 application topically 2 (two) times daily. Apply to body  Arthritis -     diclofenac sodium (VOLTAREN) 1 % GEL; Apply 4 g topically 4 (four) times daily.   Drop off records of previous coloscopies and upper endoscopy  Consider pneumonia vaccine  f/u in 8-12 weeks for GERD  Dr. Adrian Blackwater    Food Choices for Gastroesophageal Reflux Disease, Adult When you have gastroesophageal reflux disease (GERD), the foods you eat and your eating habits are very important. Choosing the right foods can help ease your discomfort. What guidelines do I need to follow?  Choose fruits, vegetables, whole grains, and low-fat dairy products.  Choose low-fat meat, fish, and poultry.  Limit fats such as oils, salad dressings, butter, nuts, and avocado.  Keep a food diary. This helps you identify foods that cause symptoms.  Avoid foods that cause symptoms. These may be different for everyone.  Eat small meals often instead of 3 large meals a day.  Eat your meals slowly, in a place where you are relaxed.  Limit fried foods.  Cook foods using methods other than frying.  Avoid drinking alcohol.  Avoid drinking large amounts of liquids with your meals.  Avoid bending over or lying down until 2-3 hours after eating. What foods are not recommended? These are some foods and drinks that may make your symptoms worse: Vegetables  Tomatoes.  Tomato juice. Tomato and spaghetti sauce. Chili peppers. Onion and garlic. Horseradish. Fruits  Oranges, grapefruit, and lemon (fruit and juice). Meats  High-fat meats, fish, and poultry. This includes hot dogs, ribs, ham, sausage, salami, and bacon. Dairy  Whole milk and chocolate milk. Sour cream. Cream. Butter. Ice cream. Cream cheese. Drinks  Coffee and tea. Bubbly (carbonated) drinks or energy drinks. Condiments  Hot sauce. Barbecue sauce. Sweets/Desserts  Chocolate and cocoa. Donuts. Peppermint and spearmint. Fats and Oils  High-fat foods. This includes [place] fries and potato chips. Other  Vinegar. Strong spices. This includes black pepper, white pepper, red pepper, cayenne, curry powder, cloves, ginger, and chili powder. The items listed above may not be a complete list of foods and drinks to avoid. Contact your dietitian for more information.  This information is not intended to replace advice given to you by your health care provider. Make sure you discuss any questions you have with your health care provider. Document Released: 08/18/2011 Document Revised: 07/25/2015 Document Reviewed: 12/21/2012 Elsevier Interactive Patient Education  2017 [employer].

## 2016-07-06 NOTE — Assessment & Plan Note (Signed)
Normotensive off medication Plan: Continue low salt diet Continue exercise Continue intermittent fasting  No need to restart HCTZ at this time

## 2016-07-06 NOTE — Progress Notes (Signed)
Subjective:  Patient ID: Wendy Mosley, female    DOB: 05/22/1950  Age: 66 y.o. MRN: [id number]  CC: Hypertension   HPI Sharnelle Cappelli is a former smoker, has HTN and HLD presents for   1. CHRONIC HYPERTENSION  Disease Monitoring  Blood pressure range: not checking   Chest pain: no   Dyspnea: no   Claudication: no   Medication compliance: no, no medication for 6 weeks after she started for feel weak and dizzy at work.  Medication Side Effects  Lightheadedness: no   Urinary frequency: no   Edema: no    2. GERD: started about 1 month ago. Had severe pain in mid chest x 2 weeks ago x 5-10 minutes. Pain started after dinner. She ate a spicy BBQ wing slider. Pain started about 2-3 hours after eating she was resting and watching TV. No pain now. Taking alka seltzer chewables. Denies change in weight. No smoking. She denies trouble swallowing.   3. HM: she has c-scope done a few months ago by Dr. Benson [place]. Reports it was normal.   Social History  Substance Use Topics  . Smoking status: Former Smoker    Packs/day: 1.00    Years: 30.00    Types: Cigarettes    Quit date: 02/01/2011  . Smokeless tobacco: Never Used  . Alcohol use Yes     Comment: occasional glass of wine    Outpatient Medications Prior to Visit  Medication Sig Dispense Refill  . aspirin 81 MG tablet Take 81 mg by mouth daily.    . clobetasol cream (TEMOVATE) 1.76 % Apply 1 application topically 2 (two) times daily. 60 g 2  . clobetasol ointment (TEMOVATE) 1.60 % Apply 1 application topically 2 (two) times daily. 30 g 0  . diclofenac sodium (VOLTAREN) 1 % GEL Apply 2 g topically 4 (four) times daily. 100 g 5  . aspirin 81 MG EC tablet Take 1 tablet (81 mg total) by mouth daily. Swallow whole. 30 tablet 12  . atorvastatin (LIPITOR) 40 MG tablet Take 1 tablet (40 mg total) by mouth daily. (Patient not taking: Reported on 07/06/2016) 90 tablet 3  . hydrochlorothiazide (HYDRODIURIL) 25 MG tablet Take 1 tablet (25  mg total) by mouth daily. (Patient not taking: Reported on 07/06/2016) 30 tablet 11  . HYDROCODONE BITARTRATE PO Take 1 tablet by mouth daily as needed. Patient states its 300mg  tablet    . omeprazole (PRILOSEC) 40 MG capsule Take 1 capsule (40 mg total) by mouth daily. 30 capsule 1  . triamcinolone ointment (KENALOG) 0.5 % Apply 1 application topically 2 (two) times daily. (Patient not taking: Reported on 07/06/2016) 30 g 0   No facility-administered medications prior to visit.     ROS Review of Systems  Constitutional: Negative for chills and fever.  Eyes: Negative for visual disturbance.  Respiratory: Negative for shortness of breath.   Cardiovascular: Positive for chest pain.  Gastrointestinal: Negative for abdominal pain and blood in stool.  Musculoskeletal: Positive for arthralgias (hands and R knee). Negative for back pain.  Skin: Negative for rash.  Allergic/Immunologic: Negative for immunocompromised state.  Hematological: Negative for adenopathy. Does not bruise/bleed easily.  Psychiatric/Behavioral: Negative for dysphoric mood and suicidal ideas.    Objective:  BP 124/76   Pulse 76   Temp 97.6 F (36.4 C) (Oral)   Ht 5\' 4"  (1.626 m)   Wt 163 lb 9.6 oz (74.2 kg)   SpO2 98%   BMI 28.08 kg/m   BP/Weight 07/06/2016 02/19/2016 01/14/2016  Systolic BP 286 381 771  Diastolic BP 76 76 89  Wt. (Lbs) 163.6 - 162.8  BMI 28.08 - 27.94    Physical Exam  Constitutional: She is oriented to person, place, and time. She appears well-developed and well-nourished. No distress.  HENT:  Head: Normocephalic and atraumatic.  Mouth/Throat: Oropharynx is clear and moist and mucous membranes are normal.  Cardiovascular: Normal rate, regular rhythm, normal heart sounds and intact distal pulses.   Pulmonary/Chest: Effort normal and breath sounds normal.  Abdominal: Soft. Bowel sounds are normal. She exhibits no distension and no mass. There is no tenderness. There is no rebound and no  guarding.  Musculoskeletal: She exhibits no edema.  Neurological: She is alert and oriented to person, place, and time.  Skin: Skin is warm and dry. No rash noted.  Psychiatric: She has a normal mood and affect.     Assessment & Plan:  Lynnea was seen today for hypertension.  Diagnoses and all orders for this visit:  Hyperlipidemia, unspecified hyperlipidemia type -     Lipid Panel  Gastroesophageal reflux disease with esophagitis -     omeprazole (PRILOSEC) 40 MG capsule; Take 1 capsule (40 mg total) by mouth daily.  Estrogen deficiency -     DG Bone Density; Future  Lichen sclerosus et atrophicus of the vulva -     clobetasol cream (TEMOVATE) 0.05 %; Apply 1 application topically 2 (two) times daily. Apply to vaginal area  Dermatitis -     triamcinolone cream (KENALOG) 0.5 %; Apply 1 application topically 2 (two) times daily. Apply to body  Arthritis -     diclofenac sodium (VOLTAREN) 1 % GEL; Apply 4 g topically 4 (four) times daily.  Essential hypertension   There are no diagnoses linked to this encounter.  No orders of the defined types were placed in this encounter.   Follow-up: Return in about 8 weeks (around 08/31/2016) for GERD.   Boykin Nearing MD

## 2016-07-06 NOTE — Assessment & Plan Note (Signed)
Non compliant with statin Checking lipids Will calculate risk for cardiovascular disease

## 2016-07-07 LAB — LIPID PANEL
Chol/HDL Ratio: 5.6 ratio — ABNORMAL HIGH (ref 0.0–4.4)
Cholesterol, Total: 212 mg/dL — ABNORMAL HIGH (ref 100–199)
HDL: 38 mg/dL — ABNORMAL LOW (ref >39)
LDL Calculated: 139 mg/dL — ABNORMAL HIGH (ref 0–99)
Triglycerides: 173 mg/dL — ABNORMAL HIGH (ref 0–149)
VLDL Cholesterol Cal: 35 mg/dL (ref 5–40)

## 2016-07-09 MED ORDER — ATORVASTATIN CALCIUM 10 MG PO TABS: 10.0000 mg | ORAL_TABLET | Freq: Every day | ORAL | 3 refills | Status: DC

## 2016-07-09 NOTE — Addendum Note (Signed)
Addended by: Boykin Nearing on: 07/09/2016 08:33 AM   Modules accepted: Orders

## 2016-07-15 ENCOUNTER — Telehealth: Payer: Self-pay

## 2016-07-15 ENCOUNTER — Encounter: Payer: Self-pay | Admitting: Family Medicine

## 2016-07-15 NOTE — Telephone Encounter (Signed)
Pt was called and informed of lab results by VM.

## 2016-07-22 ENCOUNTER — Ambulatory Visit: Payer: Medicare HMO | Attending: Family Medicine | Admitting: Physician Assistant

## 2016-07-22 ENCOUNTER — Encounter: Payer: Self-pay | Admitting: Family Medicine

## 2016-07-22 VITALS — BP 124/78 | HR 75 | Temp 98.0°F | Wt 160.6 lb

## 2016-07-22 DIAGNOSIS — J069 Acute upper respiratory infection, unspecified: Secondary | ICD-10-CM

## 2016-07-22 DIAGNOSIS — I10 Essential (primary) hypertension: Secondary | ICD-10-CM

## 2016-07-22 DIAGNOSIS — Z882 Allergy status to sulfonamides status: Secondary | ICD-10-CM | POA: Diagnosis not present

## 2016-07-22 DIAGNOSIS — E785 Hyperlipidemia, unspecified: Secondary | ICD-10-CM

## 2016-07-22 DIAGNOSIS — Z7982 Long term (current) use of aspirin: Secondary | ICD-10-CM | POA: Diagnosis not present

## 2016-07-22 DIAGNOSIS — K219 Gastro-esophageal reflux disease without esophagitis: Secondary | ICD-10-CM | POA: Diagnosis not present

## 2016-07-22 MED ORDER — CVS FISH OIL 1000 MG PO CAPS: ORAL_CAPSULE | ORAL | 11 refills | Status: DC

## 2016-07-22 NOTE — Progress Notes (Signed)
Patient ID: Wendy Mosley, female   DOB: 22-Mar-1950, 66 y.o.   MRN: [id number]   Wendy Mosley, is a 66 y.o. female  [id number]  [id number]  DOB - 05-19-1950  Subjective:  Chief Complaint and HPI: Wendy Mosley is a 66 y.o. female here today because she is concerned she is having a SE from Lipitor.  She started having body aches about 2 days after starting Lipitor.  Then she began having cough, fever, chills, runny nose that lasted 2 days.  She stopped the Lipitor immediately.  Her symptoms have been improving now for about 3-4 days.  No further fever.  Cough is much better.  Mucus is yellowish white.  No N/V/D.  She wants a natural remedy to her cough.   ROS:   Constitutional:  No f/c, No night sweats, No unexplained weight loss. EENT:  No vision changes, No blurry vision, No hearing changes. No mouth, throat, or ear problems now.  Respiratory: Resolving cough, No SOB Cardiac: No CP, no palpitations GI:  No abd pain, No N/V/D. GU: No Urinary s/sx Musculoskeletal: No joint pain.  Muscle aches resolved Neuro: No headache, no dizziness, no motor weakness.  Skin: No rash Endocrine:  No polydipsia. No polyuria.  Psych: Denies SI/HI  No problems updated.  ALLERGIES: Allergies  Allergen Reactions  . Sulfonamide Derivatives     Rash/itching     PAST MEDICAL HISTORY: Past Medical History:  Diagnosis Date  . Diverticulitis   . GERD (gastroesophageal reflux disease) 2006  . Heart attack ([place]) 2008  . Irregular heart beat 1973    MEDICATIONS AT HOME: Prior to Admission medications   Medication Sig Start Date End Date Taking? Authorizing Provider  aspirin 81 MG tablet Take 81 mg by mouth daily.   Yes [provider]  clobetasol cream (TEMOVATE) 9.98 % Apply 1 application topically 2 (two) times daily. Apply to vaginal area 07/06/16  Yes Funches, Josalyn, MD  diclofenac sodium (VOLTAREN) 1 % GEL Apply 4 g topically 4 (four) times daily. 07/06/16  Yes  Funches, Josalyn, MD  HYDROCODONE BITARTRATE PO Take 1 tablet by mouth daily as needed. Patient states its 300mg  tablet   Yes [provider]  omeprazole (PRILOSEC) 40 MG capsule Take 1 capsule (40 mg total) by mouth daily. 07/06/16 07/06/17 Yes Funches, Josalyn, MD  triamcinolone cream (KENALOG) 0.5 % Apply 1 application topically 2 (two) times daily. Apply to body 07/06/16  Yes Funches, Josalyn, MD  aspirin 81 MG EC tablet Take 1 tablet (81 mg total) by mouth daily. Swallow whole. 07/02/11 07/01/12  Gerda Diss, DO  Omega-3 Fatty Acids ([facility] FISH OIL) 1000 MG CAPS 3 tabs daily 07/22/16   Argentina Donovan, PA-C     Objective:  EXAM:   Vitals:   07/22/16 0914  BP: 124/78  Pulse: 75  Temp: 98 F (36.7 C)  TempSrc: Oral  SpO2: 96%  Weight: 160 lb 9.6 oz (72.8 kg)    General appearance : A&OX3. NAD. Non-toxic-appearing HEENT: Atraumatic and Normocephalic.  PERRLA. EOM intact.  TM clear B. Mouth-MMM, post pharynx WNL w/o erythema, No PND. Neck: supple, no JVD. No cervical lymphadenopathy. No thyromegaly Chest/Lungs:  Breathing-non-labored, Good air entry bilaterally, breath sounds normal without rales, rhonchi, or wheezing  CVS: S1 S2 regular, no murmurs, gallops, rubs Extremities: Bilateral Lower Ext shows no edema, both legs are warm to touch with = pulse throughout Neurology:  CN II-XII grossly intact, Non focal.   Psych:  TP linear. J/I WNL. Normal  speech. Appropriate eye contact and affect.  Skin:  No Rash  Data Review No results found for: HGBA1C   Assessment & Plan   1. Hyperlipidemia, unspecified hyperlipidemia type Stop statin per patient request although doubt recent body aches were due to this rather than concurrent illness.  - Omega-3 Fatty Acids ([employer] FISH OIL) 1000 MG CAPS; 3 tabs daily  Dispense: 90 capsule; Refill: 11  2. Upper respiratory tract infection, unspecified type URI care/fluids, rest  3. Essential hypertension Controlled, continue current  regimen   Patient have been counseled extensively about nutrition and exercise  Return in about 3 months (around 10/22/2016) for with Dr Darcel Smalling reassign/hyperlipidemia.  The patient was given clear instructions to go to ER or return to medical center if symptoms don't improve, worsen or new problems develop. The patient verbalized understanding. The patient was told to call to get lab results if they haven't heard anything in the next week.     Freeman Caldron, PA-C [facility] [place], [place]   07/22/2016, 9:26 AM

## 2016-09-08 ENCOUNTER — Encounter: Payer: Self-pay | Admitting: Family Medicine

## 2016-09-08 ENCOUNTER — Ambulatory Visit: Payer: Medicare HMO | Attending: Family Medicine | Admitting: Family Medicine

## 2016-09-08 VITALS — BP 136/82 | HR 80 | Temp 98.3°F | Ht 64.0 in | Wt 157.6 lb

## 2016-09-08 DIAGNOSIS — Z7982 Long term (current) use of aspirin: Secondary | ICD-10-CM | POA: Insufficient documentation

## 2016-09-08 DIAGNOSIS — I10 Essential (primary) hypertension: Secondary | ICD-10-CM

## 2016-09-08 DIAGNOSIS — Z87891 Personal history of nicotine dependence: Secondary | ICD-10-CM | POA: Insufficient documentation

## 2016-09-08 DIAGNOSIS — E785 Hyperlipidemia, unspecified: Secondary | ICD-10-CM | POA: Diagnosis not present

## 2016-09-08 DIAGNOSIS — Z79899 Other long term (current) drug therapy: Secondary | ICD-10-CM | POA: Diagnosis not present

## 2016-09-08 NOTE — Assessment & Plan Note (Signed)
Slight increase in BP off HCTZ for 3 months Slight swelling in L leg Plan: Low salt diet Exercise Recheck in 3 months

## 2016-09-08 NOTE — Patient Instructions (Addendum)
Shabrea was seen today for hypertension.  Diagnoses and all orders for this visit:  Essential hypertension  goal BP 120-130/ 80-90  Keep salt low Regular exercise, at least 150 minutes per week   F/u in 3 months for BP check , flu shot and pneumonia shot  Dr. Adrian Blackwater

## 2016-09-08 NOTE — Progress Notes (Signed)
Subjective:  Patient ID: Wendy Mosley, female    DOB: 05-May-1950  Age: 66 y.o. MRN: [id number]  CC: Hypertension   HPI Wendy Mosley is a former smoker, has HTN and HLD presents for   1. CHRONIC HYPERTENSION  Disease Monitoring  Blood pressure range: not checking   Chest pain: no   Dyspnea: no   Claudication: no   Medication compliance: no medication for 3 months  Medication Side Effects  Lightheadedness: no   Urinary frequency: no   Edema: no  She works a physical job. 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    Social History  Substance Use Topics  . Smoking status: Former Smoker    Packs/day: 1.00    Years: 30.00    Types: Cigarettes    Quit date: 02/01/2011  . Smokeless tobacco: Never Used  . Alcohol use Yes     Comment: occasional glass of wine    Outpatient Medications Prior to Visit  Medication Sig Dispense Refill  . aspirin 81 MG tablet Take 81 mg by mouth daily.    . clobetasol cream (TEMOVATE) 2.59 % Apply 1 application topically 2 (two) times daily. Apply to vaginal area 60 g 5  . diclofenac sodium (VOLTAREN) 1 % GEL Apply 4 g topically 4 (four) times daily. 100 g 11  . Omega-3 Fatty Acids ([employer] FISH OIL) 1000 MG CAPS 3 tabs daily 90 capsule 11  . omeprazole (PRILOSEC) 40 MG capsule Take 1 capsule (40 mg total) by mouth daily. 30 capsule 3  . triamcinolone cream (KENALOG) 0.5 % Apply 1 application topically 2 (two) times daily. Apply to body 60 g 5  . aspirin 81 MG EC tablet Take 1 tablet (81 mg total) by mouth daily. Swallow whole. 30 tablet 12  . HYDROCODONE BITARTRATE PO Take 1 tablet by mouth daily as needed. Patient states its 300mg  tablet     No facility-administered medications prior to visit.     ROS Review of Systems  Constitutional: Negative for chills and fever.  Eyes: Negative for visual disturbance.  Respiratory: Negative for shortness of  breath.   Cardiovascular: Positive for leg swelling (trace swelling in L leg ). Negative for chest pain.  Gastrointestinal: Negative for abdominal pain and blood in stool.  Musculoskeletal: Positive for arthralgias (hands and R knee). Negative for back pain.  Skin: Negative for rash.  Allergic/Immunologic: Negative for immunocompromised state.  Hematological: Negative for adenopathy. Does not bruise/bleed easily.  Psychiatric/Behavioral: Negative for dysphoric mood and suicidal ideas.    Objective:  BP 136/82   Pulse 80   Temp 98.3 F (36.8 C) (Oral)   Ht 5\' 4"  (1.626 m)   Wt 157 lb 9.6 oz (71.5 kg)   SpO2 96%   BMI 27.05 kg/m   BP/Weight 09/08/2016 5/63/8756 06/02/3293  Systolic BP 188 416 606  Diastolic BP 82 78 76  Wt. (Lbs) 157.6 160.6 163.6  BMI 27.05 27.57 28.08    Physical Exam  Constitutional: She is oriented to person, place, and time. She appears well-developed and well-nourished. No distress.  HENT:  Head: Normocephalic and atraumatic.  Mouth/Throat: Oropharynx is clear and moist and mucous membranes are normal.  Neck: Carotid bruit is not present.  Cardiovascular: Normal rate, regular rhythm, normal heart sounds and intact distal pulses.   Pulmonary/Chest: Effort normal and breath sounds normal.  Abdominal: Soft. Bowel sounds are normal. She exhibits no  distension and no mass. There is no tenderness. There is no rebound and no guarding.  Musculoskeletal: She exhibits no edema.  Neurological: She is alert and oriented to person, place, and time.  Skin: Skin is warm and dry. No rash noted.  Psychiatric: She has a normal mood and affect.     Assessment & Plan:  Wendy Mosley was seen today for hypertension.  Diagnoses and all orders for this visit:  Essential hypertension   There are no diagnoses linked to this encounter.  No orders of the defined types were placed in this encounter.   Follow-up: Return in about 3 months (around 12/09/2016) for HTN .    Boykin Nearing MD

## 2016-12-14 ENCOUNTER — Ambulatory Visit: Payer: Medicare HMO | Admitting: Internal Medicine

## 2016-12-24 ENCOUNTER — Encounter: Payer: Self-pay | Admitting: Internal Medicine

## 2016-12-24 ENCOUNTER — Ambulatory Visit: Payer: Medicare HMO | Attending: Internal Medicine | Admitting: Internal Medicine

## 2016-12-24 VITALS — BP 134/76 | HR 65 | Temp 98.3°F | Resp 16 | Wt 162.8 lb

## 2016-12-24 DIAGNOSIS — K219 Gastro-esophageal reflux disease without esophagitis: Secondary | ICD-10-CM | POA: Diagnosis not present

## 2016-12-24 DIAGNOSIS — R1032 Left lower quadrant pain: Secondary | ICD-10-CM

## 2016-12-24 DIAGNOSIS — Z7982 Long term (current) use of aspirin: Secondary | ICD-10-CM | POA: Diagnosis not present

## 2016-12-24 DIAGNOSIS — I10 Essential (primary) hypertension: Secondary | ICD-10-CM | POA: Diagnosis not present

## 2016-12-24 DIAGNOSIS — R600 Localized edema: Secondary | ICD-10-CM | POA: Insufficient documentation

## 2016-12-24 DIAGNOSIS — Z882 Allergy status to sulfonamides status: Secondary | ICD-10-CM | POA: Insufficient documentation

## 2016-12-24 DIAGNOSIS — Z79899 Other long term (current) drug therapy: Secondary | ICD-10-CM | POA: Diagnosis not present

## 2016-12-24 DIAGNOSIS — Z87891 Personal history of nicotine dependence: Secondary | ICD-10-CM | POA: Insufficient documentation

## 2016-12-24 DIAGNOSIS — K635 Polyp of colon: Secondary | ICD-10-CM

## 2016-12-24 DIAGNOSIS — R21 Rash and other nonspecific skin eruption: Secondary | ICD-10-CM | POA: Insufficient documentation

## 2016-12-24 DIAGNOSIS — L600 Ingrowing nail: Secondary | ICD-10-CM | POA: Diagnosis not present

## 2016-12-24 DIAGNOSIS — R200 Anesthesia of skin: Secondary | ICD-10-CM | POA: Insufficient documentation

## 2016-12-24 MED ORDER — FLUCONAZOLE 150 MG PO TABS: 150.0000 mg | ORAL_TABLET | Freq: Once | ORAL | 0 refills | Status: AC

## 2016-12-24 MED ORDER — CIPROFLOXACIN HCL 500 MG PO TABS: 500.0000 mg | ORAL_TABLET | Freq: Two times a day (BID) | ORAL | 0 refills | Status: DC

## 2016-12-24 MED ORDER — METRONIDAZOLE 500 MG PO TABS: 500.0000 mg | ORAL_TABLET | Freq: Two times a day (BID) | ORAL | 0 refills | Status: DC

## 2016-12-24 NOTE — Patient Instructions (Signed)

## 2016-12-24 NOTE — Progress Notes (Signed)
Patient ID: Wendy Mosley, female    DOB: 03-Aug-1950  MRN: [id number]  CC: re-establish   Subjective: Shelsy Seng is a 66 y.o. female who presents for chronic disease management.  Last saw Dr. Adrian Blackwater July/2018. Her concerns today include:  Patient with history of HTN, HLD, former tobacco dependence, GERD, lichen sclerosus of the vulva, OA  1. HTN: HCTZ d/c on last visit. -does not check BP -limits salt in foods. Active at work, Artist at [employer]. Uses upper ext most in her duties. -no CP/SOB/HA/dizzines. LE edema if she does a lot of standing  2. GERD: control. Takes Omeprazole PRN  3. Some numbness b/w the 1st and 2nd toe RT foot this wk. But getting better..  No initiating factor -some pain in toes LT foot. No Swelling.   4. Some pain in LT lower quadrant abdomen last wk. Drank some Tumeric tea to dec inflammatory.  Rates pain now as 5-6/10 .  -did have some hot/cold spells, light nausea. No vomiting. Did have a little diarrhea which has resolved. No blood in stoold -no dysuria -Colonoscopy done 12/201 7 revealed diverticuli in colon  5.  Complains of itchy rash on RT arm x 2-3 mths.  Using triamcinolone cream which she feels is not helping  HM: will get Flu shot at work.  Wants to hold on Prevnar  Patient Active Problem List   Diagnosis Date Noted  . Hyperlipidemia 11/14/2015  . HTN (hypertension) 11/12/2015  . Arthritis of right knee 11/12/2015  . Dermatitis 11/12/2015  . Allergic rhinitis 07/27/2012  . Lichen sclerosus et atrophicus of the vulva 07/12/2011  . GERD (gastroesophageal reflux disease) 07/02/2011  . Postmenopausal atrophic vaginitis 07/02/2011     Current Outpatient Prescriptions on File Prior to Visit  Medication Sig Dispense Refill  . aspirin 81 MG tablet Take 81 mg by mouth daily.    . clobetasol cream (TEMOVATE) 6.38 % Apply 1 application topically 2 (two) times daily. Apply to vaginal area 60 g 5  . diclofenac sodium  (VOLTAREN) 1 % GEL Apply 4 g topically 4 (four) times daily. 100 g 11  . HYDROCODONE BITARTRATE PO Take 1 tablet by mouth daily as needed. Patient states its 300mg  tablet    . Omega-3 Fatty Acids ([employer] FISH OIL) 1000 MG CAPS 3 tabs daily 90 capsule 11  . omeprazole (PRILOSEC) 40 MG capsule Take 1 capsule (40 mg total) by mouth daily. 30 capsule 3  . triamcinolone cream (KENALOG) 0.5 % Apply 1 application topically 2 (two) times daily. Apply to body 60 g 5   No current facility-administered medications on file prior to visit.     Allergies  Allergen Reactions  . Sulfonamide Derivatives     Rash/itching     Social History   Social History  . Marital status: Single    Spouse name: N/A  . Number of children: N/A  . Years of education: N/A   Occupational History  . Not on file.   Social History Main Topics  . Smoking status: Former Smoker    Packs/day: 1.00    Years: 30.00    Types: Cigarettes    Quit date: 02/01/2011  . Smokeless tobacco: Never Used  . Alcohol use Yes     Comment: occasional glass of wine  . Drug use: No     Comment: No IV drug  . Sexual activity: Not on file   Other Topics Concern  . Not on file   Social History Narrative  Mining engineer   Lives in [place]   [address]   Legally Separated to Second Husband          Family History  Problem Relation Age of Onset  . Asthma Mother   . Heart disease Mother   . Diabetes Sister   . Stroke Mother   . Cancer Maternal Grandmother        Lukemia    Past Surgical History:  Procedure Laterality Date  . APPENDECTOMY  1967  . CHOLECYSTECTOMY  1975  . EXTERNAL EAR SURGERY  1974  . TUBAL LIGATION  1977    ROS: Review of Systems Negative except as stated above PHYSICAL EXAM: BP (!) 151/82   Pulse 65   Temp 98.3 F (36.8 C) (Oral)   Resp 16   Wt 162 lb 12.8 oz (73.8 kg)   SpO2 98%   BMI 27.94 kg/m   Wt Readings from Last 3 Encounters:  12/24/16 162 lb 12.8 oz (73.8 kg)    09/08/16 157 lb 9.6 oz (71.5 kg)  07/22/16 160 lb 9.6 oz (72.8 kg)  Repeat BP 134/76  Physical Exam  General appearance - alert, well appearing, older African-American female and in no distress Mental status - alert, oriented to person, place, and time Eyes - pupils equal and reactive, extraocular eye movements intact Mouth - mucous membranes moist, pharynx normal without lesions Neck - supple, no significant adenopathy.  No carotid bruits Chest - clear to auscultation, no wheezes, rales or rhonchi, symmetric air entry Heart - normal rate, regular rhythm, normal S1, S2, no murmurs, rubs, clicks or gallops Abdomen -sluggish bowel sounds.  Mild to moderate tenderness left lower quadrant without guarding or rebound Extremities -no lower extremity edema Skin -noninflamed papular rash on palmar surface of right forearm Feet -gross sensation intact.  Feet warm.  Dorsalis pedis and posterior tibialis pulses 3+ bilaterally.  Ingrown toenail of both big toes and left second toe     Chemistry      Component Value Date/Time   NA 141 11/12/2015 1138   K 4.5 11/12/2015 1138   CL 105 11/12/2015 1138   CO2 29 11/12/2015 1138   BUN 11 11/12/2015 1138   CREATININE 0.78 11/12/2015 1138      Component Value Date/Time   CALCIUM 9.6 11/12/2015 1138   ALKPHOS 116 11/12/2015 1138   AST 25 11/12/2015 1138   ALT 26 11/12/2015 1138   BILITOT 0.7 11/12/2015 1138      ASSESSMENT AND PLAN: 1. Essential hypertension -Repeat blood pressure at goal.  Controlled off of medication.  Encouraged her to limit salt - Comprehensive metabolic panel - CBC  2. Ingrown toenail without infection -These are not bothersome to her right now.  If they do become bothersome we can refer to podiatry for removal  3. Acute abdominal pain in left lower quadrant Consistent with acute diverticulitis. Advise liquid to soft diet for the next 2-3 days. Start Cipro and Flagyl x 7 days - ciprofloxacin (CIPRO) 500 MG tablet;  Take 1 tablet (500 mg total) by mouth 2 (two) times daily.  Dispense: 14 tablet; Refill: 0 - metroNIDAZOLE (FLAGYL) 500 MG tablet; Take 1 tablet (500 mg total) by mouth 2 (two) times daily.  Dispense: 14 tablet; Refill: 0  4. Rash and nonspecific skin eruption - Ambulatory referral to Dermatology  5. Gastroesophageal reflux disease without esophagitis Controlled with as needed use of omeprazole.  Advised to avoid foods that cause flareups  Patient was given  the opportunity to ask questions.  Patient verbalized understanding of the plan and was able to repeat key elements of the plan.   Orders Placed This Encounter  Procedures  . Comprehensive metabolic panel  . CBC  . Ambulatory referral to Dermatology     Requested Prescriptions   Signed Prescriptions Disp Refills  . ciprofloxacin (CIPRO) 500 MG tablet 14 tablet 0    Sig: Take 1 tablet (500 mg total) by mouth 2 (two) times daily.  . metroNIDAZOLE (FLAGYL) 500 MG tablet 14 tablet 0    Sig: Take 1 tablet (500 mg total) by mouth 2 (two) times daily.  . fluconazole (DIFLUCAN) 150 MG tablet 1 tablet 0    Sig: Take 1 tablet (150 mg total) by mouth once.    Return in about 3 months (around 03/26/2017).  Karle Plumber, MD, FACP

## 2016-12-25 LAB — COMPREHENSIVE METABOLIC PANEL
ALT: 31 IU/L (ref 0–32)
AST: 24 IU/L (ref 0–40)
Albumin/Globulin Ratio: 1.7 (ref 1.2–2.2)
Albumin: 4.5 g/dL (ref 3.6–4.8)
Alkaline Phosphatase: 142 IU/L — ABNORMAL HIGH (ref 39–117)
BUN/Creatinine Ratio: 10 — ABNORMAL LOW (ref 12–28)
BUN: 10 mg/dL (ref 8–27)
Bilirubin Total: 0.4 mg/dL (ref 0.0–1.2)
CO2: 23 mmol/L (ref 20–29)
Calcium: 9.2 mg/dL (ref 8.7–10.3)
Chloride: 106 mmol/L (ref 96–106)
Creatinine, Ser: 0.97 mg/dL (ref 0.57–1.00)
GFR calc Af Amer: 70 mL/min/{1.73_m2} (ref >59)
GFR calc non Af Amer: 61 mL/min/{1.73_m2} (ref >59)
Globulin, Total: 2.7 g/dL (ref 1.5–4.5)
Glucose: 93 mg/dL (ref 65–99)
Potassium: 4.1 mmol/L (ref 3.5–5.2)
Sodium: 144 mmol/L (ref 134–144)
Total Protein: 7.2 g/dL (ref 6.0–8.5)

## 2016-12-25 LAB — CBC
Hematocrit: 37.2 % (ref 34.0–46.6)
Hemoglobin: 11.8 g/dL (ref 11.1–15.9)
MCH: 22.8 pg — ABNORMAL LOW (ref 26.6–33.0)
MCHC: 31.7 g/dL (ref 31.5–35.7)
MCV: 72 fL — ABNORMAL LOW (ref 79–97)
Platelets: 327 10*3/uL (ref 150–379)
RBC: 5.17 x10E6/uL (ref 3.77–5.28)
RDW: 16.6 % — ABNORMAL HIGH (ref 12.3–15.4)
WBC: 5.1 10*3/uL (ref 3.4–10.8)

## 2017-03-26 ENCOUNTER — Ambulatory Visit: Payer: Medicare HMO | Admitting: Internal Medicine

## 2017-03-26 ENCOUNTER — Encounter: Payer: Self-pay | Admitting: Internal Medicine

## 2017-03-26 NOTE — Progress Notes (Signed)
PC placed to p today to apologize for long wait time. Pt came in today but had to leave to return to work.  Pt thanked me for calling.  I will have scheduler call her with new appointment time.

## 2017-03-29 ENCOUNTER — Ambulatory Visit: Payer: Medicare HMO | Attending: Internal Medicine | Admitting: Internal Medicine

## 2017-03-29 ENCOUNTER — Other Ambulatory Visit: Payer: Self-pay | Admitting: Pharmacist

## 2017-03-29 VITALS — BP 144/80 | HR 71 | Temp 98.1°F | Ht 64.0 in | Wt 166.8 lb

## 2017-03-29 DIAGNOSIS — Z8249 Family history of ischemic heart disease and other diseases of the circulatory system: Secondary | ICD-10-CM | POA: Diagnosis not present

## 2017-03-29 DIAGNOSIS — Z9889 Other specified postprocedural states: Secondary | ICD-10-CM | POA: Diagnosis not present

## 2017-03-29 DIAGNOSIS — K2100 Gastro-esophageal reflux disease with esophagitis, without bleeding: Secondary | ICD-10-CM

## 2017-03-29 DIAGNOSIS — Z23 Encounter for immunization: Secondary | ICD-10-CM | POA: Diagnosis not present

## 2017-03-29 DIAGNOSIS — K219 Gastro-esophageal reflux disease without esophagitis: Secondary | ICD-10-CM | POA: Diagnosis not present

## 2017-03-29 DIAGNOSIS — N904 Leukoplakia of vulva: Secondary | ICD-10-CM

## 2017-03-29 DIAGNOSIS — Z9049 Acquired absence of other specified parts of digestive tract: Secondary | ICD-10-CM | POA: Diagnosis not present

## 2017-03-29 DIAGNOSIS — R1012 Left upper quadrant pain: Secondary | ICD-10-CM | POA: Diagnosis not present

## 2017-03-29 DIAGNOSIS — I10 Essential (primary) hypertension: Secondary | ICD-10-CM | POA: Diagnosis not present

## 2017-03-29 DIAGNOSIS — L299 Pruritus, unspecified: Secondary | ICD-10-CM | POA: Diagnosis not present

## 2017-03-29 DIAGNOSIS — Z9851 Tubal ligation status: Secondary | ICD-10-CM | POA: Diagnosis not present

## 2017-03-29 DIAGNOSIS — Z833 Family history of diabetes mellitus: Secondary | ICD-10-CM | POA: Insufficient documentation

## 2017-03-29 DIAGNOSIS — Z87891 Personal history of nicotine dependence: Secondary | ICD-10-CM | POA: Diagnosis not present

## 2017-03-29 DIAGNOSIS — Z79899 Other long term (current) drug therapy: Secondary | ICD-10-CM | POA: Diagnosis not present

## 2017-03-29 DIAGNOSIS — R21 Rash and other nonspecific skin eruption: Secondary | ICD-10-CM | POA: Insufficient documentation

## 2017-03-29 DIAGNOSIS — Z7982 Long term (current) use of aspirin: Secondary | ICD-10-CM | POA: Diagnosis not present

## 2017-03-29 DIAGNOSIS — Z823 Family history of stroke: Secondary | ICD-10-CM | POA: Diagnosis not present

## 2017-03-29 DIAGNOSIS — L309 Dermatitis, unspecified: Secondary | ICD-10-CM

## 2017-03-29 MED ORDER — CLOBETASOL PROPIONATE 0.05 % EX CREA: 1.0000 "application " | TOPICAL_CREAM | Freq: Two times a day (BID) | CUTANEOUS | 5 refills | Status: DC

## 2017-03-29 MED ORDER — OMEPRAZOLE 40 MG PO CPDR: 40.0000 mg | DELAYED_RELEASE_CAPSULE | Freq: Every day | ORAL | 0 refills | Status: DC

## 2017-03-29 MED ORDER — TRIAMCINOLONE ACETONIDE 0.5 % EX CREA: 1.0000 "application " | TOPICAL_CREAM | Freq: Two times a day (BID) | CUTANEOUS | 0 refills | Status: DC

## 2017-03-29 MED ORDER — PNEUMOCOCCAL 13-VAL CONJ VACC IM SUSP: 0.5000 mL | INTRAMUSCULAR | 0 refills | Status: AC

## 2017-03-29 NOTE — Progress Notes (Unsigned)
prevnar 

## 2017-03-29 NOTE — Patient Instructions (Addendum)
Please follow up at [facility] for [place] your blood pressure once a week.  Goal is to be 140/90 or lower.    Try to walk 4 times a week for 30 minutes.  Use the Clobetsol cream outside vaginal area twice a day until rash resolves then use 1-2 times a week there after.   Influenza Virus Vaccine injection (Fluarix) What is this medicine? INFLUENZA VIRUS VACCINE (in floo EN zuh VAHY ruhs vak SEEN) helps to reduce the risk of getting influenza also known as the flu. This medicine may be used for other purposes; ask your health care provider or pharmacist if you have questions. COMMON BRAND NAME(S): Fluarix, Fluzone What should I tell my health care provider before I take this medicine? They need to know if you have any of these conditions: -bleeding disorder like hemophilia -fever or infection -Guillain-Barre syndrome or other neurological problems -immune system problems -infection with the human immunodeficiency virus (HIV) or AIDS -low blood platelet counts -multiple sclerosis -an unusual or allergic reaction to influenza virus vaccine, eggs, chicken proteins, latex, gentamicin, other medicines, foods, dyes or preservatives -pregnant or trying to get pregnant -breast-feeding How should I use this medicine? This vaccine is for injection into a muscle. It is given by a health care professional. A copy of Vaccine Information Statements will be given before each vaccination. Read this sheet carefully each time. The sheet may change frequently. Talk to your pediatrician regarding the use of this medicine in children. Special care may be needed. Overdosage: If you think you have taken too much of this medicine contact a poison control center or emergency room at once. NOTE: This medicine is only for you. Do not share this medicine with others. What if I miss a dose? This does not apply. What may interact with this medicine? -chemotherapy or radiation therapy -medicines that  lower your immune system like etanercept, anakinra, infliximab, and adalimumab -medicines that treat or prevent blood clots like warfarin -phenytoin -steroid medicines like prednisone or cortisone -theophylline -vaccines This list may not describe all possible interactions. Give your health care provider a list of all the medicines, herbs, non-prescription drugs, or dietary supplements you use. Also tell them if you smoke, drink alcohol, or use illegal drugs. Some items may interact with your medicine. What should I watch for while using this medicine? Report any side effects that do not go away within 3 days to your doctor or health care professional. Call your health care provider if any unusual symptoms occur within 6 weeks of receiving this vaccine. You may still catch the flu, but the illness is not usually as bad. You cannot get the flu from the vaccine. The vaccine will not protect against colds or other illnesses that may cause fever. The vaccine is needed every year. What side effects may I notice from receiving this medicine? Side effects that you should report to your doctor or health care professional as soon as possible: -allergic reactions like skin rash, itching or hives, swelling of the face, lips, or tongue Side effects that usually do not require medical attention (report to your doctor or health care professional if they continue or are bothersome): -fever -headache -muscle aches and pains -pain, tenderness, redness, or swelling at site where injected -weak or tired This list may not describe all possible side effects. Call your doctor for medical advice about side effects. You may report side effects to FDA at 1-800-FDA-1088. Where should I keep my medicine? This  vaccine is only given in a clinic, pharmacy, doctor's office, or other health care setting and will not be stored at home. NOTE: This sheet is a summary. It may not cover all possible information. If you have  questions about this medicine, talk to your doctor, pharmacist, or health care provider.  2018 Elsevier/Gold Standard (2007-09-14 09:30:40)  Pneumococcal Conjugate Vaccine (PCV13) What You Need to Know 1. Why get vaccinated? Vaccination can protect both children and adults from pneumococcal disease. Pneumococcal disease is caused by bacteria that can spread from person to person through close contact. It can cause ear infections, and it can also lead to more serious infections of the:  Lungs (pneumonia),  Blood (bacteremia), and  Covering of the brain and spinal cord (meningitis).  Pneumococcal pneumonia is most common among adults. Pneumococcal meningitis can cause deafness and brain damage, and it kills about 1 child in 10 who get it. Anyone can get pneumococcal disease, but children under 63 years of age and adults 5 years and older, people with certain medical conditions, and cigarette smokers are at the highest risk. Before there was a vaccine, the [place] saw:  more than 700 cases of meningitis,  about 13,000 blood infections,  about 5 million ear infections, and  about 200 deaths  in children under 5 each year from pneumococcal disease. Since vaccine became available, severe pneumococcal disease in these children has fallen by 88%. About 18,000 older adults die of pneumococcal disease each year in the [place]. Treatment of pneumococcal infections with penicillin and other drugs is not as effective as it used to be, because some strains of the disease have become resistant to these drugs. This makes prevention of the disease, through vaccination, even more important. 2. PCV13 vaccine Pneumococcal conjugate vaccine (called PCV13) protects against 13 types of pneumococcal bacteria. PCV13 is routinely given to children at 2, 4, 6, and 51-60 months of age. It is also recommended for children and adults 67 to 49 years of age with certain health conditions, and for all  adults 52 years of age and older. Your doctor can give you details. 3. Some people should not get this vaccine Anyone who has ever had a life-threatening allergic reaction to a dose of this vaccine, to an earlier pneumococcal vaccine called PCV7, or to any vaccine containing diphtheria toxoid (for example, DTaP), should not get PCV13. Anyone with a severe allergy to any component of PCV13 should not get the vaccine. Tell your doctor if the person being vaccinated has any severe allergies. If the person scheduled for vaccination is not feeling well, your healthcare provider might decide to reschedule the shot on another day. 4. Risks of a vaccine reaction With any medicine, including vaccines, there is a chance of reactions. These are usually mild and go away on their own, but serious reactions are also possible. Problems reported following PCV13 varied by age and dose in the series. The most common problems reported among children were:  About half became drowsy after the shot, had a temporary loss of appetite, or had redness or tenderness where the shot was given.  About 1 out of 3 had swelling where the shot was given.  About 1 out of 3 had a mild fever, and about 1 in 20 had a fever over 102.43F.  Up to about 8 out of 10 became fussy or irritable.  Adults have reported pain, redness, and swelling where the shot was given; also mild fever, fatigue, headache, chills, or muscle  pain. Young children who get PCV13 along with inactivated flu vaccine at the same time may be at increased risk for seizures caused by fever. Ask your doctor for more information. Problems that could happen after any vaccine:  People sometimes faint after a medical procedure, including vaccination. Sitting or lying down for about 15 minutes can help prevent fainting, and injuries caused by a fall. Tell your doctor if you feel dizzy, or have vision changes or ringing in the ears.  Some older children and adults get  severe pain in the shoulder and have difficulty moving the arm where a shot was given. This happens very rarely.  Any medication can cause a severe allergic reaction. Such reactions from a vaccine are very rare, estimated at about 1 in a million doses, and would happen within a few minutes to a few hours after the vaccination. As with any medicine, there is a very small chance of a vaccine causing a serious injury or death. The safety of vaccines is always being monitored. For more information, visit: http://www.aguilar.org/ 5. What if there is a serious reaction? What should I look for? Look for anything that concerns you, such as signs of a severe allergic reaction, very high fever, or unusual behavior. Signs of a severe allergic reaction can include hives, swelling of the face and throat, difficulty breathing, a fast heartbeat, dizziness, and weakness-usually within a few minutes to a few hours after the vaccination. What should I do?  If you think it is a severe allergic reaction or other emergency that can't wait, call 9-1-1 or get the person to the nearest hospital. Otherwise, call your doctor.  Reactions should be reported to the Vaccine Adverse Event Reporting System (VAERS). Your doctor should file this report, or you can do it yourself through the VAERS web site at www.vaers.SamedayNews.es, or by calling (418)087-6094. ? VAERS does not give medical advice. 6. The National Vaccine Injury Compensation Program The [employer] Vaccine Injury Compensation Program (VICP) is a federal program that was created to compensate people who may have been injured by certain vaccines. Persons who believe they may have been injured by a vaccine can learn about the program and about filing a claim by calling 267-339-5808 or visiting the [place] website at GoldCloset.com.ee. There is a time limit to file a claim for compensation. 7. How can I learn more?  Ask your healthcare provider. He or she  can give you the vaccine package insert or suggest other sources of information.  Call your local or state health department.  Contact the Centers for Disease Control and Prevention (CDC): ? Call 708-153-1228 (1-800-CDC-INFO) or ? Visit CDC's website at http://hunter.com/ Vaccine Information Statement, PCV13 Vaccine (01/04/2014) This information is not intended to replace advice given to you by your health care provider. Make sure you discuss any questions you have with your health care provider. Document Released: 12/14/2005 Document Revised: 11/07/2015 Document Reviewed: 11/07/2015 Elsevier Interactive Patient Education  2017 [employer].

## 2017-03-29 NOTE — Progress Notes (Signed)
Patient ID: Wendy Mosley, female    DOB: Mar 20, 1950  MRN: [id number]  CC: Rash (right forearm, left side abdomen, left side vaginal area); Abdominal Pain (left upper quadrant); and Hypertension   Subjective: Wendy Mosley is a 67 y.o. female who presents for chronic ds management Her concerns today include:  Patient with history of HTN, HLD, former tobacco dependence, GERD, lichen sclerosus of the vulva, OA 1.  Rash on RT forearm on and off x 2 yrs and Lt side just recently.  Very itchy Uses Vaseline or Cocco Butter  2.  Some itching in valva.  Hx of Lichen Sclerosis.  Request RF on Clobetasol cream Not sexually active x 10 yrs  3.  HTN:  Being observed off BP med. No device to check Plans to start [place] this mth Patient Active Problem List   Diagnosis Date Noted  . Colonic polyp 12/24/2016  . Hyperlipidemia 11/14/2015  . HTN (hypertension) 11/12/2015  . Arthritis of right knee 11/12/2015  . Dermatitis 11/12/2015  . Allergic rhinitis 07/27/2012  . Lichen sclerosus et atrophicus of the vulva 07/12/2011  . GERD (gastroesophageal reflux disease) 07/02/2011  . Postmenopausal atrophic vaginitis 07/02/2011     Current Outpatient Medications on File Prior to Visit  Medication Sig Dispense Refill  . aspirin 81 MG tablet Take 81 mg by mouth daily.    . clobetasol cream (TEMOVATE) 7.78 % Apply 1 application topically 2 (two) times daily. Apply to vaginal area 60 g 5  . diclofenac sodium (VOLTAREN) 1 % GEL Apply 4 g topically 4 (four) times daily. 100 g 11  . Omega-3 Fatty Acids ([facility] FISH OIL) 1000 MG CAPS 3 tabs daily 90 capsule 11  . triamcinolone cream (KENALOG) 0.5 % Apply 1 application topically 2 (two) times daily. Apply to body 60 g 5  . HYDROCODONE BITARTRATE PO Take 1 tablet by mouth daily as needed. Patient states its 300mg  tablet     No current facility-administered medications on file prior to visit.     Allergies  Allergen Reactions  . Sulfonamide  Derivatives     Rash/itching     Social History   Socioeconomic History  . Marital status: Single    Spouse name: Not on file  . Number of children: Not on file  . Years of education: Not on file  . Highest education level: Not on file  Social Needs  . Financial resource strain: Not on file  . Food insecurity - worry: Not on file  . Food insecurity - inability: Not on file  . Transportation needs - medical: Not on file  . Transportation needs - non-medical: Not on file  Occupational History  . Not on file  Tobacco Use  . Smoking status: Former Smoker    Packs/day: 1.00    Years: 30.00    Pack years: 30.00    Types: Cigarettes    Last attempt to quit: 02/01/2011    Years since quitting: 6.1  . Smokeless tobacco: Never Used  Substance and Sexual Activity  . Alcohol use: Yes    Comment: occasional glass of wine  . Drug use: No    Comment: No IV drug  . Sexual activity: Not on file  Other Topics Concern  . Not on file  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[place]   [place]   Legally Separated to Second Husband          Family History  Problem Relation Age of Onset  . Asthma Mother   . Heart disease Mother   . Diabetes Sister   . Stroke Mother   . Cancer Maternal Grandmother        Lukemia    Past Surgical History:  Procedure Laterality Date  . APPENDECTOMY  1967  . CHOLECYSTECTOMY  1975  . EXTERNAL EAR SURGERY  1974  . TUBAL LIGATION  1977    ROS: Review of Systems Some itching in RT ear  PHYSICAL EXAM: BP (!) 153/89 (BP Location: Right Arm, Patient Position: Sitting, Cuff Size: Normal)   Pulse 71   Temp 98.1 F (36.7 C) (Oral)   Ht 5\' 4"  (1.626 m)   Wt 166 lb 12.8 oz (75.7 kg)   SpO2 97%   BMI 28.63 kg/m   Wt Readings from Last 3 Encounters:  03/29/17 166 lb 12.8 oz (75.7 kg)  12/24/16 162 lb 12.8 oz (73.8 kg)  09/08/16 157 lb 9.6 oz (71.5 kg)   Repeat BP 144/80  Physical Exam  General appearance -  alert, well appearing, and in no distress Mental status - alert, oriented to person, place, and time, normal mood, behavior, speech, dress, motor activity, and thought processes Ears - bilateral TM's and external ear canals normal Neck - supple, no significant adenopathy Chest - clear to auscultation, no wheezes, rales or rhonchi, symmetric air entry Heart - normal rate, regular rhythm, normal S1, S2, no murmurs, rubs, clicks or gallops Extremities - peripheral pulses normal, no pedal edema, no clubbing or cyanosis Skin: Atopic appearing rash on the right forearm.  Some excoriation on her left side   ASSESSMENT AND PLAN: 1. Essential hypertension Not at goal Pt does not want to restart med at this time She plans to start going to gym 3-4 x a wk. have encouraged her to exercise for 30 minutes. Advised to check blood pressure at least once a week and keep a log.  Goal is to keep the blood pressure less than 140/90  2. Rash of body - triamcinolone cream (KENALOG) 0.5 %; Apply 1 application topically 2 (two) times daily. Apply to rash on  RT arm and side  Dispense: 60 g; Refill: 0  3. Lichen sclerosus et atrophicus of the vulva - clobetasol cream (TEMOVATE) 0.05 %; Apply 1 application topically 2 (two) times daily. Apply to vaginal area  Dispense: 60 g; Refill: 5  4. Need for Streptococcus pneumoniae and influenza vaccination - Flu Vaccine QUAD 6+ mos PF IM (Fluarix Quad PF) She will get the Prevnar 13 through the pharmacy 5. Itching of ear Recommend using Claritin over-the-counter  Patient was given the opportunity to ask questions.  Patient verbalized understanding of the plan and was able to repeat key elements of the plan.   No orders of the defined types were placed in this encounter.    Requested Prescriptions    No prescriptions requested or ordered in this encounter    No Follow-up on file.  Karle Plumber, MD, FACP

## 2017-04-02 ENCOUNTER — Ambulatory Visit: Payer: Medicare HMO | Admitting: Internal Medicine

## 2017-04-09 ENCOUNTER — Ambulatory Visit: Payer: Medicare HMO | Admitting: Internal Medicine

## 2017-08-17 DIAGNOSIS — K219 Gastro-esophageal reflux disease without esophagitis: Secondary | ICD-10-CM | POA: Diagnosis not present

## 2017-08-17 DIAGNOSIS — Z131 Encounter for screening for diabetes mellitus: Secondary | ICD-10-CM | POA: Diagnosis not present

## 2017-08-17 DIAGNOSIS — Z6828 Body mass index (BMI) 28.0-28.9, adult: Secondary | ICD-10-CM | POA: Diagnosis not present

## 2017-08-17 DIAGNOSIS — K5900 Constipation, unspecified: Secondary | ICD-10-CM | POA: Diagnosis not present

## 2017-08-17 DIAGNOSIS — E785 Hyperlipidemia, unspecified: Secondary | ICD-10-CM | POA: Diagnosis not present

## 2017-08-17 DIAGNOSIS — L309 Dermatitis, unspecified: Secondary | ICD-10-CM | POA: Diagnosis not present

## 2017-09-08 DIAGNOSIS — M791 Myalgia, unspecified site: Secondary | ICD-10-CM | POA: Diagnosis not present

## 2017-09-08 DIAGNOSIS — D259 Leiomyoma of uterus, unspecified: Secondary | ICD-10-CM | POA: Diagnosis not present

## 2017-09-08 DIAGNOSIS — Z0000 Encounter for general adult medical examination without abnormal findings: Secondary | ICD-10-CM | POA: Diagnosis not present

## 2017-09-08 DIAGNOSIS — Z1231 Encounter for screening mammogram for malignant neoplasm of breast: Secondary | ICD-10-CM | POA: Diagnosis not present

## 2017-09-08 DIAGNOSIS — E2839 Other primary ovarian failure: Secondary | ICD-10-CM | POA: Diagnosis not present

## 2017-09-08 DIAGNOSIS — K219 Gastro-esophageal reflux disease without esophagitis: Secondary | ICD-10-CM | POA: Diagnosis not present

## 2017-09-08 DIAGNOSIS — Z1331 Encounter for screening for depression: Secondary | ICD-10-CM | POA: Diagnosis not present

## 2017-09-08 DIAGNOSIS — Z13820 Encounter for screening for osteoporosis: Secondary | ICD-10-CM | POA: Diagnosis not present

## 2017-09-17 ENCOUNTER — Other Ambulatory Visit: Payer: Self-pay | Admitting: Internal Medicine

## 2017-09-17 DIAGNOSIS — Z1231 Encounter for screening mammogram for malignant neoplasm of breast: Secondary | ICD-10-CM

## 2017-09-17 DIAGNOSIS — E2839 Other primary ovarian failure: Secondary | ICD-10-CM

## 2017-09-28 ENCOUNTER — Other Ambulatory Visit: Payer: Self-pay | Admitting: Obstetrics and Gynecology

## 2017-09-29 DIAGNOSIS — R9389 Abnormal findings on diagnostic imaging of other specified body structures: Secondary | ICD-10-CM | POA: Diagnosis not present

## 2017-09-29 DIAGNOSIS — R102 Pelvic and perineal pain: Secondary | ICD-10-CM | POA: Diagnosis not present

## 2017-09-29 DIAGNOSIS — D252 Subserosal leiomyoma of uterus: Secondary | ICD-10-CM | POA: Diagnosis not present

## 2017-11-09 DIAGNOSIS — R9389 Abnormal findings on diagnostic imaging of other specified body structures: Secondary | ICD-10-CM | POA: Diagnosis not present

## 2017-12-10 DIAGNOSIS — R9389 Abnormal findings on diagnostic imaging of other specified body structures: Secondary | ICD-10-CM | POA: Diagnosis not present

## 2017-12-14 ENCOUNTER — Emergency Department (HOSPITAL_COMMUNITY)
Admission: EM | Admit: 2017-12-14 | Discharge: 2017-12-14 | Disposition: A | Discharge status: Home / Self Care | Payer: Medicare HMO | Attending: Emergency Medicine | Admitting: Emergency Medicine

## 2017-12-14 ENCOUNTER — Encounter (HOSPITAL_COMMUNITY): Payer: Self-pay | Admitting: *Deleted

## 2017-12-14 ENCOUNTER — Other Ambulatory Visit: Payer: Self-pay

## 2017-12-14 DIAGNOSIS — Z79899 Other long term (current) drug therapy: Secondary | ICD-10-CM | POA: Insufficient documentation

## 2017-12-14 DIAGNOSIS — S61012A Laceration without foreign body of left thumb without damage to nail, initial encounter: Secondary | ICD-10-CM | POA: Diagnosis not present

## 2017-12-14 DIAGNOSIS — W269XXA Contact with unspecified sharp object(s), initial encounter: Secondary | ICD-10-CM | POA: Insufficient documentation

## 2017-12-14 DIAGNOSIS — Y999 Unspecified external cause status: Secondary | ICD-10-CM | POA: Diagnosis not present

## 2017-12-14 DIAGNOSIS — I10 Essential (primary) hypertension: Secondary | ICD-10-CM | POA: Insufficient documentation

## 2017-12-14 DIAGNOSIS — Z7982 Long term (current) use of aspirin: Secondary | ICD-10-CM | POA: Insufficient documentation

## 2017-12-14 DIAGNOSIS — Y929 Unspecified place or not applicable: Secondary | ICD-10-CM | POA: Insufficient documentation

## 2017-12-14 DIAGNOSIS — Z87891 Personal history of nicotine dependence: Secondary | ICD-10-CM | POA: Insufficient documentation

## 2017-12-14 DIAGNOSIS — Y939 Activity, unspecified: Secondary | ICD-10-CM | POA: Insufficient documentation

## 2017-12-14 MED ORDER — LIDOCAINE HCL 2 % IJ SOLN
20.0000 mL | Freq: Once | INTRAMUSCULAR | Status: AC
  Administered 2017-12-14: 400 mg
  Filled 2017-12-14: qty 20

## 2017-12-14 NOTE — ED Provider Notes (Signed)
[facility] EMERGENCY DEPARTMENT Provider Note   CSN: [id number] Arrival date & time: 12/14/17  1932     History   Chief Complaint Chief Complaint  Patient presents with  . Laceration    HPI Wendy Mosley is a 67 y.o. female.  HPI   67 year old female presents today with a laceration to her left thumb.  Patient notes she was cleaning a fish when she cut the thumb.  She notes bleeding controlled with direct pressure, notes her tetanus vaccination was approximately 3 years ago.  No other injuries.  Past Medical History:  Diagnosis Date  . Diverticulitis   . GERD (gastroesophageal reflux disease) 2006  . Heart attack ([place]) 2008  . Irregular heart beat 1973    Patient Active Problem List   Diagnosis Date Noted  . Colonic polyp 12/24/2016  . Hyperlipidemia 11/14/2015  . HTN (hypertension) 11/12/2015  . Arthritis of right knee 11/12/2015  . Dermatitis 11/12/2015  . Allergic rhinitis 07/27/2012  . Lichen sclerosus et atrophicus of the vulva 07/12/2011  . GERD (gastroesophageal reflux disease) 07/02/2011  . Postmenopausal atrophic vaginitis 07/02/2011    Past Surgical History:  Procedure Laterality Date  . APPENDECTOMY  1967  . CHOLECYSTECTOMY  1975  . EXTERNAL EAR SURGERY  1974  . TUBAL LIGATION  1977     OB History   None    Obstetric Comments  Last Period - 67 yo No post menopausal bleeding No abnormal PAP smears reported - Unsure.         Home Medications    Prior to Admission medications   Medication Sig Start Date End Date Taking? Authorizing Provider  aspirin 81 MG tablet Take 81 mg by mouth daily.    [provider]  clobetasol cream (TEMOVATE) 3.76 % Apply 1 application topically 2 (two) times daily. Apply to vaginal area 03/29/17   Ladell Pier, MD  diclofenac sodium (VOLTAREN) 1 % GEL Apply 4 g topically 4 (four) times daily. 07/06/16   Funches, Adriana Mccallum, MD  HYDROCODONE BITARTRATE PO Take 1 tablet by mouth  daily as needed. Patient states its 300mg  tablet    [provider]  Omega-3 Fatty Acids ([facility] FISH OIL) 1000 MG CAPS 3 tabs daily 07/22/16   Freeman Caldron M, PA-C  omeprazole (PRILOSEC) 40 MG capsule Take 1 capsule (40 mg total) by mouth daily. 03/29/17 03/29/18  Ladell Pier, MD  triamcinolone cream (KENALOG) 0.5 % Apply 1 application topically 2 (two) times daily. Apply to rash on  RT arm and side 03/29/17   Ladell Pier, MD    Family History Family History  Problem Relation Age of Onset  . Asthma Mother   . Heart disease Mother   . Stroke Mother   . Diabetes Sister   . Cancer Maternal Grandmother        Lukemia    Social History Social History   Tobacco Use  . Smoking status: Former Smoker    Packs/day: 1.00    Years: 30.00    Pack years: 30.00    Types: Cigarettes    Last attempt to quit: 02/01/2011    Years since quitting: 6.8  . Smokeless tobacco: Never Used  Substance Use Topics  . Alcohol use: Yes    Comment: occasional glass of wine  . Drug use: No    Comment: No IV drug     Allergies   Sulfonamide derivatives   Review of Systems Review of Systems  All other systems reviewed  and are negative.   Physical Exam Updated Vital Signs BP 138/83 (BP Location: Right Arm)   Pulse 81   Temp 98.3 F (36.8 C) (Oral)   Resp 17   SpO2 99%   Physical Exam  Constitutional: She is oriented to person, place, and time. She appears well-developed and well-nourished.  HENT:  Head: Normocephalic and atraumatic.  Eyes: Pupils are equal, round, and reactive to light. Conjunctivae are normal. Right eye exhibits no discharge. Left eye exhibits no discharge. No scleral icterus.  Neck: Normal range of motion. No JVD present. No tracheal deviation present.  Pulmonary/Chest: Effort normal. No stridor.  Musculoskeletal:  1.5 cm laceration noted over the left dorsal thumb at the DIP, no deep space involvement, bleeding controlled, no foreign bodies full active  range of motion at the DIP  Neurological: She is alert and oriented to person, place, and time. Coordination normal.  Psychiatric: She has a normal mood and affect. Her behavior is normal. Judgment and thought content normal.  Nursing note and vitals reviewed.    ED Treatments / Results  Labs (all labs ordered are listed, but only abnormal results are displayed) Labs Reviewed - No data to display  EKG None  Radiology No results found.  Procedures .Marland KitchenLaceration Repair Date/Time: 12/14/2017 9:50 PM Performed by: Okey Regal, PA-C Authorized by: Okey Regal, PA-C   Consent:    Consent obtained:  Verbal   Consent given by:  Patient   Risks discussed:  Infection, pain, need for additional repair and retained foreign body   Alternatives discussed:  No treatment Anesthesia (see MAR for exact dosages):    Anesthesia method:  Local infiltration   Local anesthetic:  Lidocaine 2% w/o epi Laceration details:    Location: left thumb    Length (cm):  1.5 Repair type:    Repair type:  Simple Pre-procedure details:    Preparation:  Patient was prepped and draped in usual sterile fashion Exploration:    Hemostasis achieved with:  Direct pressure   Wound exploration: wound explored through full range of motion and entire depth of wound probed and visualized     Wound extent: no fascia violation noted, no foreign bodies/material noted, no muscle damage noted, no nerve damage noted, no tendon damage noted and no underlying fracture noted     Contaminated: no   Skin repair:    Repair method:  Sutures   Suture material:  Fast-absorbing gut   Suture technique:  Simple interrupted   Number of sutures:  4 Approximation:    Approximation:  Close Post-procedure details:    Dressing:  Antibiotic ointment   Patient tolerance of procedure:  Tolerated well, no immediate complications   (including critical care time)  Medications Ordered in ED Medications  lidocaine (XYLOCAINE) 2 %  (with pres) injection 400 mg (has no administration in time range)     Initial Impression / Assessment and Plan / ED Course  I have reviewed the triage vital signs and the nursing notes.  Pertinent labs & imaging results that were available during my care of the patient were reviewed by me and considered in my medical decision making (see chart for details).     Labs:   Imaging:  Consults:  Therapeutics: Lidocaine  Discharge Meds:   Assessment/Plan: 67 year old female presents today with laceration.  This is uncomplicated thoroughly cleansed and repaired here.  Strict return precautions given, she verbalized understanding and agreement to today's plan had no further questions or concerns.   Final Clinical  Impressions(s) / ED Diagnoses   Final diagnoses:  Laceration of left thumb without foreign body without damage to nail, initial encounter    ED Discharge Orders    None       Francee Gentile 12/14/17 2151    Lennice Sites, DO 12/14/17 2209

## 2017-12-14 NOTE — ED Triage Notes (Signed)
Pt was cleaning fish and cut her L thumb with a finger. Laceration noted to lateral thumb, bleeding controllled, bandaged placed

## 2017-12-14 NOTE — ED Notes (Signed)
Suture cart at bedside 

## 2017-12-14 NOTE — Discharge Instructions (Addendum)
Please read attached information. If you experience any new or worsening signs or symptoms please return to the emergency room for evaluation. Please follow-up with your primary care provider or specialist as discussed.  °

## 2018-02-08 ENCOUNTER — Other Ambulatory Visit: Payer: Self-pay | Admitting: Obstetrics and Gynecology

## 2018-02-14 ENCOUNTER — Encounter (HOSPITAL_COMMUNITY): Payer: Self-pay

## 2018-02-14 ENCOUNTER — Other Ambulatory Visit: Payer: Self-pay

## 2018-02-14 ENCOUNTER — Encounter (HOSPITAL_COMMUNITY)
Admission: RE | Admit: 2018-02-14 | Discharge: 2018-02-14 | Disposition: A | Discharge status: Home / Self Care | Payer: Medicare HMO | Source: Ambulatory Visit | Attending: Obstetrics and Gynecology | Admitting: Obstetrics and Gynecology

## 2018-02-14 DIAGNOSIS — Z01812 Encounter for preprocedural laboratory examination: Secondary | ICD-10-CM | POA: Diagnosis not present

## 2018-02-14 HISTORY — DX: Unspecified cataract: H26.9

## 2018-02-14 LAB — CBC
HCT: 40.4 % (ref 36.0–46.0)
Hemoglobin: 12 g/dL (ref 12.0–15.0)
MCH: 22.4 pg — ABNORMAL LOW (ref 26.0–34.0)
MCHC: 29.7 g/dL — ABNORMAL LOW (ref 30.0–36.0)
MCV: 75.5 fL — ABNORMAL LOW (ref 80.0–100.0)
Platelets: 329 10*3/uL (ref 150–400)
RBC: 5.35 MIL/uL — ABNORMAL HIGH (ref 3.87–5.11)
RDW: 15.7 % — ABNORMAL HIGH (ref 11.5–15.5)
WBC: 5.3 10*3/uL (ref 4.0–10.5)
nRBC: 0 % (ref 0.0–0.2)

## 2018-02-14 NOTE — Pre-Procedure Instructions (Signed)
Wendy Mosley  02/14/2018      [facility] ([facility] [place] [address] [place] ([place]) [place] [address] Phone: [phone] Fax: [place], [place] [address] [place] [address] Phone: [phone] Fax: [place], [address] [place] [address] Phone: [phone] Fax: [phone]  [id number] [place], [place] [address] [place] [address] Phone: [phone] Fax: [phone]    Your procedure is scheduled on Monday, Dec. 23rd   Report to [facility]   Call this number if you have problems the morning of surgery:  [phone]   Remember:   Do not eat any foods after 12 midnight Sunday.    Take these medicines the morning of surgery with A SIP OF WATER : Omeprazole              5 days prior to surgery, STOP TAKING any Vitamins, Anti-inflammatories, Herbal Supplements.    Do not wear jewelry, make-up or nail polish.  Do not wear lotions, powders,  perfumes, or deodorant.  Do not shave 48 hours prior to surgery.   Do not bring valuables to the hospital.  [facility] is not responsible for any belongings or valuables.  Contacts, dentures or bridgework may not be worn into surgery.  Leave your suitcase in the car.  After surgery it may be brought to your room.  For patients admitted to the hospital, discharge time will be determined by your treatment team.  Patients discharged the day of surgery will not be allowed to drive home, and will need someone to stay with you for the first 24 hrs.   Please read over the following fact sheets that you were given. Surgical Site Infection Prevention       [facility]- Preparing For  Surgery  Before surgery, you can play an important role. Because skin is not sterile, your skin needs to be as free of germs as possible. You can reduce the number of germs on your skin by washing with CHG (chlorahexidine gluconate) Soap before surgery.  CHG is an antiseptic cleaner which kills germs and bonds with the skin to continue killing germs even after washing.    Oral Hygiene is also important to reduce your risk of infection.    Remember - BRUSH YOUR TEETH THE MORNING OF SURGERY WITH YOUR REGULAR TOOTHPASTE  Please do not use if you have an allergy to CHG or antibacterial soaps. If your skin becomes reddened/irritated stop using the CHG.  Do not shave (including legs and underarms) for at least 48 hours prior to first CHG shower. It is OK to shave your face.  Please follow these instructions carefully.   1. Shower the NIGHT BEFORE SURGERY and the MORNING OF SURGERY with CHG.   2. If you chose to wash your hair, wash your hair first as usual with your normal shampoo.  3. After you shampoo, rinse your hair and body thoroughly to remove the shampoo.  4. Use CHG as you would any other liquid soap. You can apply CHG directly to the skin and wash gently with a scrungie or a clean washcloth.   5. Apply the CHG Soap to your body  ONLY FROM THE NECK DOWN.  Do not use on open wounds or open sores. Avoid contact with your eyes, ears, mouth and genitals (private parts). Wash Face and genitals (private parts)  with your normal soap.  6. Wash thoroughly, paying special attention to the area where your surgery will be performed.  7. Thoroughly rinse your body with warm water from the neck down.  8. DO NOT shower/wash with your normal soap after using and rinsing off the CHG Soap.  9. Pat yourself dry with a CLEAN TOWEL.  10. Wear CLEAN PAJAMAS to bed the night before surgery, wear comfortable clothes the morning of surgery  11. Place CLEAN SHEETS on your bed the night of your first shower  and DO NOT SLEEP WITH PETS.    Day of Surgery:  Do not apply any deodorants/lotions.  Please wear clean clothes to the hospital/surgery center.   Remember to brush your teeth WITH YOUR REGULAR TOOTHPASTE.

## 2018-02-14 NOTE — Progress Notes (Addendum)
Clarification of order request per Jackquline Berlin, surgical scheduler.  Patient to be NPO after midnight.  No drink to be given. Instructed patient to be at [facility] at 11 AM for surgery at 1:30pm.  Informed her that she may drive herself to the hospital, but will need someone else to drive her home.  And that she'll need someone to stay with her for the first 24 hrs.  She understands and agrees.

## 2018-02-21 ENCOUNTER — Ambulatory Visit (HOSPITAL_COMMUNITY)
Admission: RE | Admit: 2018-02-21 | Discharge: 2018-02-21 | Disposition: A | Discharge status: Home / Self Care | Payer: Medicare HMO | Attending: Obstetrics and Gynecology | Admitting: Obstetrics and Gynecology

## 2018-02-21 ENCOUNTER — Encounter (HOSPITAL_COMMUNITY)
Admission: RE | Disposition: A | Discharge status: Home / Self Care | Payer: Self-pay | Source: Home / Self Care | Attending: Obstetrics and Gynecology

## 2018-02-21 ENCOUNTER — Encounter (HOSPITAL_COMMUNITY): Payer: Self-pay | Admitting: *Deleted

## 2018-02-21 ENCOUNTER — Other Ambulatory Visit: Payer: Self-pay

## 2018-02-21 ENCOUNTER — Ambulatory Visit (HOSPITAL_COMMUNITY): Payer: Medicare HMO | Admitting: Anesthesiology

## 2018-02-21 DIAGNOSIS — Z7982 Long term (current) use of aspirin: Secondary | ICD-10-CM | POA: Diagnosis not present

## 2018-02-21 DIAGNOSIS — I252 Old myocardial infarction: Secondary | ICD-10-CM | POA: Insufficient documentation

## 2018-02-21 DIAGNOSIS — R9389 Abnormal findings on diagnostic imaging of other specified body structures: Secondary | ICD-10-CM | POA: Diagnosis not present

## 2018-02-21 DIAGNOSIS — Z87891 Personal history of nicotine dependence: Secondary | ICD-10-CM | POA: Diagnosis not present

## 2018-02-21 DIAGNOSIS — Z79899 Other long term (current) drug therapy: Secondary | ICD-10-CM | POA: Diagnosis not present

## 2018-02-21 DIAGNOSIS — K219 Gastro-esophageal reflux disease without esophagitis: Secondary | ICD-10-CM | POA: Diagnosis not present

## 2018-02-21 DIAGNOSIS — N840 Polyp of corpus uteri: Secondary | ICD-10-CM | POA: Diagnosis not present

## 2018-02-21 DIAGNOSIS — Z882 Allergy status to sulfonamides status: Secondary | ICD-10-CM | POA: Insufficient documentation

## 2018-02-21 DIAGNOSIS — Z791 Long term (current) use of non-steroidal anti-inflammatories (NSAID): Secondary | ICD-10-CM | POA: Diagnosis not present

## 2018-02-21 HISTORY — DX: Unspecified hearing loss, unspecified ear: H91.90

## 2018-02-21 HISTORY — PX: DILATATION & CURETTAGE/HYSTEROSCOPY WITH MYOSURE: SHX6511

## 2018-02-21 SURGERY — DILATATION & CURETTAGE/HYSTEROSCOPY WITH MYOSURE
Anesthesia: General

## 2018-02-21 MED ORDER — MEPERIDINE HCL 25 MG/ML IJ SOLN: 6.2500 mg | INTRAMUSCULAR | Status: DC | PRN

## 2018-02-21 MED ORDER — OXYCODONE HCL 5 MG PO TABS: 5.0000 mg | ORAL_TABLET | Freq: Once | ORAL | Status: DC | PRN

## 2018-02-21 MED ORDER — KETOROLAC TROMETHAMINE 30 MG/ML IJ SOLN
INTRAMUSCULAR | Status: DC | PRN
  Administered 2018-02-21: 15 mg via INTRAVENOUS

## 2018-02-21 MED ORDER — ONDANSETRON HCL 4 MG/2ML IJ SOLN
INTRAMUSCULAR | Status: DC | PRN
  Administered 2018-02-21: 4 mg via INTRAVENOUS

## 2018-02-21 MED ORDER — ONDANSETRON HCL 4 MG/2ML IJ SOLN
INTRAMUSCULAR | Status: AC
  Filled 2018-02-21: qty 2

## 2018-02-21 MED ORDER — LACTATED RINGERS IV SOLN
INTRAVENOUS | Status: DC
  Administered 2018-02-21: 13:00:00 via INTRAVENOUS

## 2018-02-21 MED ORDER — MIDAZOLAM HCL 2 MG/2ML IJ SOLN
INTRAMUSCULAR | Status: AC
  Filled 2018-02-21: qty 2

## 2018-02-21 MED ORDER — PROMETHAZINE HCL 25 MG/ML IJ SOLN: 6.2500 mg | INTRAMUSCULAR | Status: DC | PRN

## 2018-02-21 MED ORDER — SODIUM CHLORIDE 0.9 % IR SOLN
Status: DC | PRN
  Administered 2018-02-21: 3000 mL

## 2018-02-21 MED ORDER — HYDROMORPHONE HCL 1 MG/ML IJ SOLN: 0.2500 mg | INTRAMUSCULAR | Status: DC | PRN

## 2018-02-21 MED ORDER — FENTANYL CITRATE (PF) 100 MCG/2ML IJ SOLN
INTRAMUSCULAR | Status: DC | PRN
  Administered 2018-02-21 (×4): 25 ug via INTRAVENOUS

## 2018-02-21 MED ORDER — PROPOFOL 10 MG/ML IV BOLUS
INTRAVENOUS | Status: AC
  Filled 2018-02-21: qty 20

## 2018-02-21 MED ORDER — DEXAMETHASONE SODIUM PHOSPHATE 10 MG/ML IJ SOLN
INTRAMUSCULAR | Status: AC
  Filled 2018-02-21: qty 1

## 2018-02-21 MED ORDER — DEXAMETHASONE SODIUM PHOSPHATE 10 MG/ML IJ SOLN
INTRAMUSCULAR | Status: DC | PRN
  Administered 2018-02-21: 10 mg via INTRAVENOUS

## 2018-02-21 MED ORDER — PROPOFOL 10 MG/ML IV BOLUS
INTRAVENOUS | Status: DC | PRN
  Administered 2018-02-21: 150 mg via INTRAVENOUS

## 2018-02-21 MED ORDER — MIDAZOLAM HCL 5 MG/5ML IJ SOLN
INTRAMUSCULAR | Status: DC | PRN
  Administered 2018-02-21: 1 mg via INTRAVENOUS

## 2018-02-21 MED ORDER — FENTANYL CITRATE (PF) 100 MCG/2ML IJ SOLN
INTRAMUSCULAR | Status: AC
  Filled 2018-02-21: qty 2

## 2018-02-21 MED ORDER — OXYCODONE HCL 5 MG/5ML PO SOLN: 5.0000 mg | Freq: Once | ORAL | Status: DC | PRN

## 2018-02-21 MED ORDER — LIDOCAINE HCL (CARDIAC) PF 100 MG/5ML IV SOSY
PREFILLED_SYRINGE | INTRAVENOUS | Status: AC
  Filled 2018-02-21: qty 5

## 2018-02-21 MED ORDER — KETOROLAC TROMETHAMINE 30 MG/ML IJ SOLN
INTRAMUSCULAR | Status: AC
  Filled 2018-02-21: qty 1

## 2018-02-21 MED ORDER — LIDOCAINE HCL (CARDIAC) PF 100 MG/5ML IV SOSY
PREFILLED_SYRINGE | INTRAVENOUS | Status: DC | PRN
  Administered 2018-02-21: 100 mg via INTRAVENOUS

## 2018-02-21 SURGICAL SUPPLY — 13 items
CATH ROBINSON RED A/P 16FR (CATHETERS) ×2 IMPLANT
DEVICE MYOSURE REACH (MISCELLANEOUS) ×1 IMPLANT
DILATOR CANAL MILEX (MISCELLANEOUS) ×1 IMPLANT
GLOVE BIOGEL PI IND STRL 7.0 (GLOVE) ×2 IMPLANT
GLOVE BIOGEL PI INDICATOR 7.0 (GLOVE) ×2
GLOVE ECLIPSE 6.5 STRL STRAW (GLOVE) ×2 IMPLANT
GOWN STRL REUS W/TWL LRG LVL3 (GOWN DISPOSABLE) ×4 IMPLANT
HIBICLENS CHG 4% 4OZ BTL (MISCELLANEOUS) ×2 IMPLANT
KIT PROCEDURE FLUENT (KITS) ×2 IMPLANT
PACK VAGINAL MINOR WOMEN LF (CUSTOM PROCEDURE TRAY) ×2 IMPLANT
PAD OB MATERNITY 4.3X12.25 (PERSONAL CARE ITEMS) ×2 IMPLANT
SEAL ROD LENS SCOPE MYOSURE (ABLATOR) ×2 IMPLANT
TOWEL OR 17X24 6PK STRL BLUE (TOWEL DISPOSABLE) ×4 IMPLANT

## 2018-02-21 NOTE — Transfer of Care (Signed)
Immediate Anesthesia Transfer of Care Note  Patient: Wendy Mosley  Procedure(s) Performed: DILATATION & CURETTAGE/HYSTEROSCOPY WITH MYOSURE RESECTION OF ENDOMETRIAL POLYP (N/A )  Patient Location: PACU  Anesthesia Type:General  Level of Consciousness: sedated  Airway & Oxygen Therapy: Patient Spontanous Breathing and Patient connected to nasal cannula oxygen  Post-op Assessment: Report given to RN and Post -op Vital signs reviewed and stable  Post vital signs: stable  Last Vitals:  Vitals Value Taken Time  BP    Temp    Pulse 87 02/21/2018  2:20 PM  Resp 15 02/21/2018  2:20 PM  SpO2 96 % 02/21/2018  2:20 PM  Vitals shown include unvalidated device data.  Last Pain:  Vitals:   02/21/18 1153  TempSrc: Oral  PainSc: 0-No pain      Patients Stated Pain Goal: 3 (98/02/21 7981)  Complications: No apparent anesthesia complications

## 2018-02-21 NOTE — Discharge Instructions (Signed)
  Post Anesthesia Home Care Instructions  Activity: Get plenty of rest for the remainder of the day. A responsible individual must stay with you for 24 hours following the procedure.  For the next 24 hours, DO NOT: -Drive a car -Operate machinery -Drink alcoholic beverages -Take any medication unless instructed by your physician -Make any legal decisions or sign important papers.  Meals: Start with liquid foods such as gelatin or soup. Progress to regular foods as tolerated. Avoid greasy, spicy, heavy foods. If nausea and/or vomiting occur, drink only clear liquids until the nausea and/or vomiting subsides. Call your physician if vomiting continues.  Special Instructions/Symptoms: Your throat may feel dry or sore from the anesthesia or the breathing tube placed in your throat during surgery. If this causes discomfort, gargle with warm salt water. The discomfort should disappear within 24 hours.  DISCHARGE INSTRUCTIONS: D&C / D&E The following instructions have been prepared to help you care for yourself upon your return home.   Personal hygiene: . Use sanitary pads for vaginal drainage, not tampons. . Shower the day after your procedure. . NO tub baths, pools or Jacuzzis for 2-3 weeks. . Wipe front to back after using the bathroom.  Activity and limitations: . Do NOT drive or operate any equipment for 24 hours. The effects of anesthesia are still present and drowsiness may result. . Do NOT rest in bed all day. . Walking is encouraged. . Walk up and down stairs slowly. . You may resume your normal activity in one to two days or as indicated by your physician.  Sexual activity: NO intercourse for at least 2 weeks after the procedure, or as indicated by your physician.  Diet: Eat a light meal as desired this evening. You may resume your usual diet tomorrow.  Return to work: You may resume your work activities in one to two days or as indicated by your doctor.  What to expect after  your surgery: Expect to have vaginal bleeding/discharge for 2-3 days and spotting for up to 10 days. It is not unusual to have soreness for up to 1-2 weeks. You may have a slight burning sensation when you urinate for the first day. Mild cramps may continue for a couple of days. You may have a regular period in 2-6 weeks.  Call your doctor for any of the following: . Excessive vaginal bleeding, saturating and changing one pad every hour. . Inability to urinate 6 hours after discharge from hospital. . Pain not relieved by pain medication. . Fever of 100.4 F or greater. . Unusual vaginal discharge or odor.      

## 2018-02-21 NOTE — Brief Op Note (Signed)
02/21/2018  2:21 PM  PATIENT:  Wendy Mosley  67 y.o. female  PRE-OPERATIVE DIAGNOSIS:  Endometrial Thickening on Sonogram  POST-OPERATIVE DIAGNOSIS:  Endometrial Thickening on Sonogram, endometrial polyps  PROCEDURE:  Diagnostic hysteroscopy, D&C, hysteroscopic resection of endometrial polyp using myosure  SURGEON:  Surgeon(s) and Role:    * Servando Salina, MD - Primary  PHYSICIAN ASSISTANT:   ASSISTANTS: none   ANESTHESIA:   general Findings: multiple endometrial polyps anterior LUS, atrophic endometrial wall, tubal ostia seen EBL:  10 mL   BLOOD ADMINISTERED:none  DRAINS: none   LOCAL MEDICATIONS USED:  none  SPECIMEN:  Source of Specimen:  emc with polyps  DISPOSITION OF SPECIMEN:  PATHOLOGY  COUNTS:  YES  TOURNIQUET:  * No tourniquets in log *  DICTATION: .Other Dictation: Dictation Number [id number]  PLAN OF CARE: Discharge to home after PACU  PATIENT DISPOSITION:  PACU - hemodynamically stable.   Delay start of Pharmacological VTE agent (>24hrs) due to surgical blood loss or risk of bleeding: no

## 2018-02-21 NOTE — Anesthesia Postprocedure Evaluation (Signed)
Anesthesia Post Note  Patient: ANZLEIGH SLAVEN  Procedure(s) Performed: DILATATION & CURETTAGE/HYSTEROSCOPY WITH MYOSURE RESECTION OF ENDOMETRIAL POLYP (N/A )     Patient location during evaluation: PACU Anesthesia Type: General Level of consciousness: awake and alert Pain management: pain level controlled Vital Signs Assessment: post-procedure vital signs reviewed and stable Respiratory status: spontaneous breathing, nonlabored ventilation and respiratory function stable Cardiovascular status: blood pressure returned to baseline and stable Postop Assessment: no apparent nausea or vomiting Anesthetic complications: no    Last Vitals:  Vitals:   02/21/18 1500 02/21/18 1548  BP: (!) 154/81 (!) 159/86  Pulse: 71 75  Resp: 11 14  Temp:  36.7 C  SpO2: 99% 97%    Last Pain:  Vitals:   02/21/18 1548  TempSrc:   PainSc: 0-No pain   Pain Goal: Patients Stated Pain Goal: 3 (02/21/18 1153)               Lynda Rainwater

## 2018-02-21 NOTE — Anesthesia Preprocedure Evaluation (Signed)
Anesthesia Evaluation  Patient identified by MRN, date of birth, ID band Patient awake    Reviewed: Allergy & Precautions, NPO status , Patient's Chart, lab work & pertinent test results  Airway Mallampati: II  TM Distance: >3 FB Neck ROM: Full    Dental no notable dental hx.    Pulmonary neg pulmonary ROS, former smoker,    Pulmonary exam normal breath sounds clear to auscultation       Cardiovascular hypertension, + Past MI  Normal cardiovascular exam Rhythm:Regular Rate:Normal     Neuro/Psych negative neurological ROS  negative psych ROS   GI/Hepatic Neg liver ROS, GERD  ,  Endo/Other  negative endocrine ROS  Renal/GU negative Renal ROS  negative genitourinary   Musculoskeletal  (+) Arthritis ,   Abdominal   Peds negative pediatric ROS (+)  Hematology negative hematology ROS (+)   Anesthesia Other Findings   Reproductive/Obstetrics negative OB ROS                             Anesthesia Physical Anesthesia Plan  ASA: III  Anesthesia Plan: General   Post-op Pain Management:    Induction: Intravenous  PONV Risk Score and Plan: 3 and Ondansetron, Dexamethasone and Midazolam  Airway Management Planned: LMA  Additional Equipment:   Intra-op Plan:   Post-operative Plan: Extubation in OR  Informed Consent: I have reviewed the patients History and Physical, chart, labs and discussed the procedure including the risks, benefits and alternatives for the proposed anesthesia with the patient or authorized representative who has indicated his/her understanding and acceptance.   Dental advisory given  Plan Discussed with: CRNA  Anesthesia Plan Comments:         Anesthesia Quick Evaluation

## 2018-02-21 NOTE — H&P (Signed)
Wendy Mosley is an 67 y.o. female. BF presents for surgical evaluation of endometrial thickening on sonogram  Pertinent Gynecological History:   Menstrual History: Menarche age: n/a No LMP recorded. Patient is postmenopausal.    Past Medical History:  Diagnosis Date  . Cataracts, bilateral    right eye - just watching  . Diverticulitis   . GERD (gastroesophageal reflux disease) 2006  . Hearing loss    right ear - no hearing aid  . Heart attack ([place]) 2008  . Irregular heart beat 1973  . SVD (spontaneous vaginal delivery)    x 3    Past Surgical History:  Procedure Laterality Date  . APPENDECTOMY  1967  . BREAST SURGERY     cyst removed from right breast at age 83  . CHOLECYSTECTOMY  1975  . COLONOSCOPY    . EXTERNAL EAR SURGERY  1974  . TUBAL LIGATION  1977    Family History  Problem Relation Age of Onset  . Asthma Mother   . Heart disease Mother   . Stroke Mother   . Diabetes Sister   . Cancer Maternal Grandmother        Lukemia    Social History:  reports that she quit smoking about 7 years ago. Her smoking use included cigarettes. She has a 30.00 pack-year smoking history. She has never used smokeless tobacco. She reports current alcohol use. She reports that she does not use drugs.  Allergies:  Allergies  Allergen Reactions  . Sulfonamide Derivatives     Rash/itching     Medications Prior to Admission  Medication Sig Dispense Refill Last Dose  . aspirin 81 MG tablet Take 81 mg by mouth every other day.    Past Month at Unknown time  . B COMPLEX-ZINC PO Take 1 tablet by mouth daily.   Past Month at Unknown time  . hydroxypropyl methylcellulose / hypromellose (ISOPTO TEARS / GONIOVISC) 2.5 % ophthalmic solution Place 1 drop into both eyes daily as needed for dry eyes.   Past Month at Unknown time  . ibuprofen (ADVIL,MOTRIN) 200 MG tablet Take 400-600 mg by mouth every 6 (six) hours as needed for moderate pain.   Past Week at Unknown time  . Omega-3  Fatty Acids ([employer] FISH OIL) 1000 MG CAPS 3 tabs daily (Patient taking differently: Take 1,000 mg by mouth 2 (two) times daily. 3 tabs daily) 90 capsule 11 Past Month at Unknown time  . omeprazole (PRILOSEC) 40 MG capsule Take 1 capsule (40 mg total) by mouth daily. (Patient taking differently: Take 40 mg by mouth daily as needed (heartburn). ) 30 capsule 0 02/21/2018 at 0900  . triamcinolone cream (KENALOG) 0.5 % Apply 1 application topically 2 (two) times daily. Apply to rash on  RT arm and side (Patient taking differently: Apply 1 application topically daily as needed (itching). Apply to rash on  RT arm and side) 60 g 0 Past Month at Unknown time  . clobetasol cream (TEMOVATE) 3.76 % Apply 1 application topically 2 (two) times daily. Apply to vaginal area (Patient not taking: Reported on 02/09/2018) 60 g 5 Not Taking at Unknown time  . diclofenac sodium (VOLTAREN) 1 % GEL Apply 4 g topically 4 (four) times daily. (Patient not taking: Reported on 02/09/2018) 100 g 11 Not Taking at Unknown time    Review of Systems  All other systems reviewed and are negative.   Blood pressure (!) 152/97, pulse 79, temperature 98.4 F (36.9 C), temperature source Oral, resp. rate  16, height 5\' 4"  (1.626 m), weight 75.8 kg, SpO2 96 %. Physical Exam  Constitutional: She is oriented to person, place, and time. She appears well-developed and well-nourished.  HENT:  Head: Atraumatic.  Eyes: EOM are normal.  Neck: Neck supple.  Respiratory: Breath sounds normal.  GI: Soft.  Genitourinary:    Genitourinary Comments: Vulva nl Vagina nl Cervix nl  uterus sl enlarged  adnexa no palp mass   Musculoskeletal: Normal range of motion.  Neurological: She is alert and oriented to person, place, and time.  Skin: Skin is warm and dry.  Psychiatric: She has a normal mood and affect.    No results found for this or any previous visit (from the past 24 hour(s)).  No results found.  Assessment/Plan: Endometrial  thickening on sonogram P) dx hysteroscopy, D&C. Risk of surgery reviewed including infection, bleeding, uterine perforation and its risk, thermal injury, injury to surrounding organ structures, fluid overload and its mgmt. All ? answered  Estelle Greenleaf A Leannah Guse 02/21/2018, 1:34 PM

## 2018-02-21 NOTE — Anesthesia Procedure Notes (Signed)
Procedure Name: LMA Insertion Date/Time: 02/21/2018 1:43 PM Performed by: Ignacia Bayley, CRNA Pre-anesthesia Checklist: Patient identified, Emergency Drugs available, Suction available, Timeout performed and Patient being monitored Patient Re-evaluated:Patient Re-evaluated prior to induction Oxygen Delivery Method: Circle system utilized Preoxygenation: Pre-oxygenation with 100% oxygen Induction Type: IV induction LMA: LMA inserted LMA Size: 4.0 Number of attempts: 1 Tube secured with: Tape Dental Injury: Teeth and Oropharynx as per pre-operative assessment

## 2018-02-22 ENCOUNTER — Encounter (HOSPITAL_COMMUNITY): Payer: Self-pay | Admitting: Obstetrics and Gynecology

## 2018-02-22 NOTE — Op Note (Signed)
NAME: Wendy Mosley, Wendy Mosley MEDICAL RECORD OJ:[id number] ACCOUNT [id number] DATE OF BIRTH:08-25-1950 FACILITY: [facility] LOCATION: [facility] PHYSICIAN:Reyes Aldaco A. Akeen Ledyard, MD  OPERATIVE REPORT  DATE OF PROCEDURE:  02/21/2018  PREOPERATIVE DIAGNOSIS:  Endometrial thickening on sonogram.  POSTOPERATIVE DIAGNOSES:  Endometrial thickening on sonogram, endometrial polyps.  PROCEDURES PERFORMED:  Diagnostic hysteroscopy, hysteroscopic resection of endometrial polyps, dilation and curettage.  ANESTHESIA:  General.  SURGEON:  Servando Salina, MD  ASSISTANT:  None.  DESCRIPTION OF PROCEDURE:  Under adequate general anesthesia, the patient was placed in the dorsal lithotomy position.  She was sterilely prepped and draped in the usual fashion.  Bladder was catheterized for a moderate amount of urine.  Examination under anesthesia revealed an anteverted uterus.  No adnexal masses could be appreciated.  Weighted speculum was placed in the vagina.  Sims retractor was placed anteriorly.  The cervix was grasped with a single tooth tenaculum.  The cervix was then  serially dilated up to #21 [facility] dilator.  A diagnostic hysteroscope was introduced into the uterine cavity.  Multiple anterior lower uterine segment polyps were noted.  Both tubal ostia could be seen.  Using the Reach MyoSure resectoscope, the endometrial polyps and the endometrial wall were then resected.  Once this was felt to be adequate all instruments were then removed from the vagina.  The endocervical canal was without any lesions.  SPECIMEN:  Endometrial curettings with polyp sent to pathology.  ESTIMATED BLOOD LOSS:  Minimal.  FLUID DEFICIT:  80 mL.  COMPLICATIONS:  None.  The patient tolerated the procedure well and was transferred to recovery room in stable condition.  AN/NUANCE  D:02/21/2018 T:02/22/2018 JOB:[id number]

## 2018-03-25 ENCOUNTER — Other Ambulatory Visit: Payer: Self-pay | Admitting: Internal Medicine

## 2018-03-25 DIAGNOSIS — R21 Rash and other nonspecific skin eruption: Secondary | ICD-10-CM

## 2018-03-25 DIAGNOSIS — L309 Dermatitis, unspecified: Secondary | ICD-10-CM

## 2018-05-07 DIAGNOSIS — H524 Presbyopia: Secondary | ICD-10-CM | POA: Diagnosis not present

## 2018-05-09 ENCOUNTER — Other Ambulatory Visit: Payer: Self-pay | Admitting: Internal Medicine

## 2018-05-09 DIAGNOSIS — R21 Rash and other nonspecific skin eruption: Secondary | ICD-10-CM

## 2018-05-09 DIAGNOSIS — L309 Dermatitis, unspecified: Secondary | ICD-10-CM

## 2018-05-27 ENCOUNTER — Ambulatory Visit (INDEPENDENT_AMBULATORY_CARE_PROVIDER_SITE_OTHER): Payer: Self-pay

## 2018-05-27 DIAGNOSIS — E785 Hyperlipidemia, unspecified: Secondary | ICD-10-CM

## 2018-05-27 DIAGNOSIS — I10 Essential (primary) hypertension: Secondary | ICD-10-CM

## 2018-05-27 DIAGNOSIS — K2100 Gastro-esophageal reflux disease with esophagitis, without bleeding: Secondary | ICD-10-CM

## 2018-05-27 NOTE — Patient Instructions (Signed)
Social Worker Visit Information  Goals we discussed today:  Goals Addressed            This Visit's Progress     Patient Stated   . "I want to learn aboout advance directives" (pt-stated)       Current Barriers:  . Limited education about the importance of naming a healthcare power of attorney  Clinical Social Work Clinical Goal(s):  [name] Over the next 30 days, patient will verbalize basic understanding of Advanced Directives and importance of completion . Over the next 30 days, the patient will complete mailed Advance Directive packet with the assistance of SW  Interventions: . Interviewed patient about Financial controller and provided education about the importance of completing advanced directives  . Mailed the patient an advance directive packet  . Scheduled a follow up call with the patient to review information provided  Patient Self Care Activities:  . Self administers medications as prescribed . Attends all scheduled provider appointments . Calls provider office for new concerns or questions  Initial goal documentation             Materials provided: Yes: mailed advance directive packet  [name] was given information about Chronic Care Management services today including:  1. CCM service includes personalized support from designated clinical staff supervised by her physician, including individualized plan of care and coordination with other care providers 2. 24/7 contact phone numbers for assistance for urgent and routine care needs. 3. Service will only be billed when office clinical staff spend 20 minutes or more in a month to coordinate care. 4. Only one practitioner may furnish and bill the service in a calendar month. 5. The patient may stop CCM services at any time (effective at the end of the month) by phone call to the office staff. 6. The patient will be responsible for cost sharing (co-pay) of up to 20% of the service fee (after annual deductible  is met).  Patient agreed to services and verbal consent obtained.   The patient verbalized understanding of instructions provided today and declined a print copy of patient instruction materials.   Follow up plan: SW to contact the patient by phone in the next 3-4 weeks   Daneen Schick, [place], CDP [facility] Social Worker [phone]

## 2018-05-27 NOTE — Chronic Care Management (AMB) (Signed)
  Chronic Care Management   Telephone Outreach Note  05/27/2018 Name: Wendy Mosley MRN: [id number] DOB: 30-Oct-1950  Referred by: patient's health plan. Reason for referral : Chronic Disease Management and Care Coordination   I reached out to [name] today by phone in response to a referral sent by [name]'s health plan.  [name] was given information about Chronic Care Management services today including:  1. CCM service includes personalized support from designated clinical staff supervised by her physician, including individualized plan of care and coordination with other care providers 2. 24/7 contact phone numbers for assistance for urgent and routine care needs. 3. Service will only be billed when office clinical staff spend 20 minutes or more in a month to coordinate care. 4. Only one practitioner may furnish and bill the service in a calendar month. 5. The patient may stop CCM services at any time (effective at the end of the month) by phone call to the office staff. 6. The patient will be responsible for cost sharing (co-pay) of up to 20% of the service fee (after annual deductible is met).  Patient agreed to services and verbal consent obtained.    SDOH (Social Determinants of Health) screening performed today. The patient identifies challenges with: Housing . It is reported the patient rents a home with her fiance and has some concern of mold in the bathroom. The patient declines any assistance regarding this issue as "someone from  The city is going to come inspect it". The patient does request assistance with advance directive education.   Goals Addressed            This Visit's Progress     Patient Stated   . "I want to learn aboout advance directives" (pt-stated)       Current Barriers:  . Limited education about the importance of naming a healthcare power of attorney  Clinical Social Work Clinical Goal(s):  [name] Over the  next 30 days, patient will verbalize basic understanding of Advanced Directives and importance of completion . Over the next 30 days, the patient will complete mailed Advance Directive packet with the assistance of SW  Interventions: . Interviewed patient about Financial controller and provided education about the importance of completing advanced directives  . Mailed the patient an advance directive packet  . Scheduled a follow up call with the patient to review information provided  Patient Self Care Activities:  . Self administers medications as prescribed . Attends all scheduled provider appointments . Calls provider office for new concerns or questions  Initial goal documentation       SW will follow up with the patient in the next 3-4 weeks to review advance directive packet.  Glendale Chard, MD has been notified of this outreach and [name]'s decision and plan.   Daneen Schick, BSW, CDP [facility] / [facility] Care Management Social Worker [phone]  Total time spent performing care coordination and/or care management activities with the patient by phone or face to face = 38 minutes.

## 2018-05-27 NOTE — Chronic Care Management (AMB) (Signed)
  Chronic Care Management   Note  05/27/2018 Name: DEVONY MCGRADY MRN: [id number] DOB: [birth year]   Chronic Care Management   Initial Visit Note  05/27/2018 Name: MASAKO OVERALL MRN: [id number] DOB: 05-Aug-1950  Referred by: Glendale Chard, MD Reason for referral : Chronic Care Management   ANGELO CAROLL is a 68 y.o. year old female who is a primary care patient of Glendale Chard, MD. The CCM team was consulted for assistance with chronic disease management and care coordination needs.   Review of patient status, including review of consultants reports, relevant laboratory and other test results, and collaboration with appropriate care team members and the patient's provider was performed as part of comprehensive patient evaluation and provision of chronic care management services.    I initiated and established the plan of care for DORAINE SCHEXNIDER during one on one collaboration with my clinical care management colleague Daneen Schick BSW who is also engaged with this patient to address social work needs.   Goals Addressed      Patient Stated   . "I want to learn aboout advance directives" (pt-stated)       Current Barriers:  . Limited education about the importance of naming a healthcare power of attorney  Clinical Social Work Clinical Goal(s):  [name] Over the next 30 days, patient will verbalize basic understanding of Advanced Directives and importance of completion . Over the next 30 days, the patient will complete mailed Advance Directive packet with the assistance of SW  Interventions: . Interviewed patient about Financial controller and provided education about the importance of completing advanced directives  . Mailed the patient an advance directive packet  . Scheduled a follow up call with the patient to review information provided  Patient Self Care Activities:  . Self administers medications as prescribed . Attends all scheduled provider appointments  . Calls provider office for new concerns or questions  Initial goal documentation       Other   . Identify patient Care Coordination and Disease Management Needs       Current Barriers:   Knowledge Deficits related to care coordination needs and chronic disease management needs  Nurse Case Manager Clinical Goal(s):   Over the next 90 days, patient will collaborate with care management team to address care coordination and chronic disease management needs related to Disease Management for medical comorbidities.   Interventions:   One on one collaboration establish plan of care to address care coordination and disease management needs with clinical colleague Daneen Schick BSW.   Patient Self Care Activities:   Currently UNABLE TO independently self manage needs related to chronic health conditions.   Initial goal documentation      RN CM will reach out to the patient over the next 5-7 days to assess for CM needs and education opportunity.   Barb Merino, RN,CCM  Care Management Coordinator [facility]  Direct Phone: [phone]

## 2018-06-02 ENCOUNTER — Telehealth: Payer: Self-pay

## 2018-06-03 ENCOUNTER — Telehealth: Payer: Self-pay

## 2018-06-03 ENCOUNTER — Ambulatory Visit (INDEPENDENT_AMBULATORY_CARE_PROVIDER_SITE_OTHER): Payer: Medicare HMO

## 2018-06-03 DIAGNOSIS — E785 Hyperlipidemia, unspecified: Secondary | ICD-10-CM | POA: Diagnosis not present

## 2018-06-03 DIAGNOSIS — R1032 Left lower quadrant pain: Secondary | ICD-10-CM

## 2018-06-03 DIAGNOSIS — I10 Essential (primary) hypertension: Secondary | ICD-10-CM | POA: Diagnosis not present

## 2018-06-03 DIAGNOSIS — R21 Rash and other nonspecific skin eruption: Secondary | ICD-10-CM

## 2018-06-06 ENCOUNTER — Other Ambulatory Visit: Payer: Self-pay

## 2018-06-06 ENCOUNTER — Telehealth: Payer: Self-pay | Admitting: Internal Medicine

## 2018-06-06 ENCOUNTER — Ambulatory Visit: Payer: Self-pay

## 2018-06-06 ENCOUNTER — Telehealth: Payer: Self-pay

## 2018-06-06 DIAGNOSIS — I10 Essential (primary) hypertension: Secondary | ICD-10-CM

## 2018-06-06 DIAGNOSIS — E785 Hyperlipidemia, unspecified: Secondary | ICD-10-CM

## 2018-06-06 DIAGNOSIS — R21 Rash and other nonspecific skin eruption: Secondary | ICD-10-CM

## 2018-06-06 DIAGNOSIS — R1032 Left lower quadrant pain: Secondary | ICD-10-CM

## 2018-06-06 NOTE — Telephone Encounter (Signed)
Patient has agreed to do a virtual visit with dr. Baird Cancer on 06/07/18

## 2018-06-06 NOTE — Chronic Care Management (AMB) (Signed)
  Chronic Care Management   Follow Up Note   06/06/2018 Name: Wendy Mosley MRN: [id number] DOB: Apr 03, 1950  Referred by: Glendale Chard, MD Reason for referral : Chronic Care Management (Telephone CCM follow up call )   Wendy Mosley is a 68 y.o. year old female who is a primary care patient of Glendale Chard, MD. The CCM team was consulted for assistance with chronic disease management and care coordination needs.    Review of patient status, including review of consultants reports, relevant laboratory and other test results, and collaboration with appropriate care team members and the patient's provider was performed as part of comprehensive patient evaluation and provision of chronic care management services.    I spoke with Wendy Mosley by telephone today.   Goals Addressed      Patient Stated   . "I need a refill for Clobetasol for my rash" (pt-stated)       Current Barriers:   Knowledge deficits related to indication for Clobetasol and etiology/type of skin rash . Ineffective health maintenance (patient is out of her Clobetasol used to treat skin rash)  Nurse Case Manager Clinical Goal(s):  Over the next 30 days, patient will verbalize understanding of plan for pharmacological treatment plan for skin rash.  Interventions:   Telephone CCM outreach to patient in response to patient's request to speak with RNCM per [facility]  Answered patient's questions related to her request for a refill for Clobetasol  Advised patient Dr.Sander's would like for her to schedule a virtual visit to assess her rash and before she can authorize a refill for Clobetasol  Provided patient with the office phone # for [facility] (she plans to call today)  Provided patient with RNCM contact # and hours of availability  Scheduled a CCM follow up visit with patient  Patient Self Care Activities:  . Self administers medications as prescribed . Attends all scheduled provider  appointments . Calls pharmacy for medication refills . Attends church or other social activities . Performs ADL's independently . Performs IADL's independently . Calls provider office for new concerns or questions  Please see past updates related to this goal by clicking on the "Past Updates" button in the selected goal         The CM team will reach out to the patient again over the next 2-3 weeks.    Barb Merino, RN,CCM Care Management Coordinator [facility]  Direct Phone: [phone]

## 2018-06-06 NOTE — Patient Instructions (Signed)
Visit Information  Goals Addressed      Patient Stated   . "I need a refill for Clobetasol for my rash" (pt-stated)       Current Barriers:   Knowledge deficits related to indication for Clobetasol and etiology/type of skin rash . Ineffective health maintenance (patient is out of her Clobetasol used to treat skin rash)  Nurse Case Manager Clinical Goal(s):  Over the next 30 days, patient will verbalize understanding of plan for pharmacological treatment plan for skin rash.  Interventions:   Telephone CCM outreach to patient in response to patient's request to speak with RNCM per [facility]  Answered patient's questions related to her request for a refill for Clobetasol  Advised patient Dr.Sander's would like for her to schedule a virtual visit to assess her rash and before she can authorize a refill for Clobetasol  Provided patient with the office phone # for [facility] (she plans to call today)  Provided patient with RNCM contact # and hours of availability  Scheduled a CCM follow up visit with patient  Patient Self Care Activities:  . Self administers medications as prescribed . Attends all scheduled provider appointments . Calls pharmacy for medication refills . Attends church or other social activities . Performs ADL's independently . Performs IADL's independently . Calls provider office for new concerns or questions  Please see past updates related to this goal by clicking on the "Past Updates" button in the selected goal        The CM team will reach out to the patient again over the next 2-3 weeks.   Barb Merino, RN,CCM Care Management Coordinator [place] [facility]  Direct Phone: [phone]

## 2018-06-06 NOTE — Patient Instructions (Signed)
Visit Information  Goals Addressed      Patient Stated   . "I have pressure and intermittent pain in my lower abdomen" (pt-stated)       Current Barriers:  [name] Knowledge Deficits related to etiology for lower abdominal pain  Nurse Case Manager Clinical Goal(s):  [name] Over the next 60 days, patient will work with Designer, fashion/clothing  to address needs related to unresolved uterine pain  Interventions:   Telephone CCM outreach for initial RNCM follow up and goal setting . Evaluation of current treatment plan related to uterine pain and patient's adherence to plan as established by provider. . Advised patient to schedule a follow up visit with her established GYN to further evaluate unresolved uterine pain . Discussed plans with patient for ongoing care management follow up and provided patient with direct contact information for care management team . Scheduled a CCM follow up call with patient  Patient Self Care Activities:  . Self administers medications as prescribed . Attends all scheduled provider appointments . Calls pharmacy for medication refills . Attends church or other social activities . Performs ADL's independently . Performs IADL's independently . Calls provider office for new concerns or questions  Initial goal documentation    . "I know I need to start exercising again" (pt-stated)       Current Barriers:  [name] Knowledge Deficits related to health benefits associated with physical exercise  Nurse Case Manager Clinical Goal(s):  [name] Over the next 30 days, patient will verbalize having a plan for implementing a structured home exercise plan.  Interventions:   Telephone CCM outreach for initial RNCM follow up and goal setting . Evaluation of current treatment plan related to HEP and patient's adherence to plan as established by provider . Advised patient to start off slowly and increase activity/exercise as tolerated . Provided education to patient re: the health  benefits from having a consistent routine home exercise regimen to help with cardio health and build stamina  . Discussed plans with patient for ongoing care management follow up and provided patient with direct contact information for care management team . Scheduled a follow up call with patient   Patient Self Care Activities:  . Self administers medications as prescribed . Attends all scheduled provider appointments . Calls pharmacy for medication refills . Attends church or other social activities . Performs ADL's independently . Performs IADL's independently . Calls provider office for new concerns or questions  Initial goal documentation     . "I need a refill for Clobetasol for my rash" (pt-stated)       Current Barriers:   Knowledge deficits related to indication for Clobetasol and etiology/type of skin rash . Ineffective health maintenance (patient is out of her Clobetasol used to treat skin rash)  Nurse Case Manager Clinical Goal(s):  Over the next 30 days, patient will verbalize understanding of plan for pharmacological treatment plan for skin rash.  Interventions:   Telephone CCM outreach for initial RNCM follow up and goal setting . Evaluation of current treatment plan related to treatment of rash and patient's adherence to plan as established by provider . Reviewed medications with patient and discussed Clobetasol is not listed on her medication profile . Collaborated with Dr. Baird Cancer regarding patient's request for refill for her Clobetasol (per Dr. Baird Cancer patient will need to complete a virtual visit with Dr. Baird Cancer before refill can be authorized) . Informed patient of response by PCP re: need for PCP f/u  Discussed plans with patient for  ongoing care management follow up and provided patient with direct contact information for care management team  Scheduled a CCM follow up visit with patient  Patient Self Care Activities:  . Self administers medications as  prescribed . Attends all scheduled provider appointments . Calls pharmacy for medication refills . Attends church or other social activities . Performs ADL's independently . Performs IADL's independently . Calls provider office for new concerns or questions  Initial goal documentation      The patient verbalized understanding of instructions provided today and declined a print copy of patient instruction materials.   The CM team will reach out to the patient again over the next 2-3 weeks.   Barb Merino, RN,CCM Care Management Coordinator [place] [facility]  Direct Phone: [phone]

## 2018-06-06 NOTE — Chronic Care Management (AMB) (Signed)
Chronic Care Management   Initial Visit Note  06/06/2018 Name: Wendy Mosley MRN: [id number] DOB: 08/26/1950  Referred by: Glendale Chard, MD Reason for referral : Chronic Care Management (INITIAL CCM telephone RNCM outreach)   Wendy Mosley is a 68 y.o. year old female who is a primary care patient of Glendale Chard, MD. The CCM team was consulted for assistance with chronic disease management and care coordination needs.   Review of patient status, including review of consultants reports, relevant laboratory and other test results, and collaboration with appropriate care team members and the patient's provider was performed as part of comprehensive patient evaluation and provision of chronic care management services.    Objective:  No results found for: HGBA1C Lab Results  Component Value Date   LDLCALC 139 (H) 07/06/2016   CREATININE 0.97 12/24/2016   BP Readings from Last 3 Encounters:  02/21/18 (!) 159/86  02/14/18 (!) 145/76  12/14/17 138/83    Goals Addressed      Patient Stated   . "I have pressure and intermittent pain in my lower abdomen" (pt-stated)       Current Barriers:  [name] Knowledge Deficits related to etiology for lower abdominal pain  Nurse Case Manager Clinical Goal(s):  [name] Over the next 60 days, patient will work with Designer, fashion/clothing  to address needs related to unresolved uterine pain  Interventions:   Telephone CCM outreach for initial RNCM follow up and goal setting . Evaluation of current treatment plan related to uterine pain and patient's adherence to plan as established by provider. . Advised patient to schedule a follow up visit with her established GYN to further evaluate unresolved uterine pain . Discussed plans with patient for ongoing care management follow up and provided patient with direct contact information for care management team . Scheduled a CCM follow up call with patient  Patient Self Care Activities:  . Self  administers medications as prescribed . Attends all scheduled provider appointments . Calls pharmacy for medication refills . Attends church or other social activities . Performs ADL's independently . Performs IADL's independently . Calls provider office for new concerns or questions  Initial goal documentation    . "I know I need to start exercising again" (pt-stated)       Current Barriers:  [name] Knowledge Deficits related to health benefits associated with physical exercise  Nurse Case Manager Clinical Goal(s):  [name] Over the next 30 days, patient will verbalize having a plan for implementing a structured home exercise plan.  Interventions:   Telephone CCM outreach for initial RNCM follow up and goal setting . Evaluation of current treatment plan related to HEP and patient's adherence to plan as established by provider . Advised patient to start off slowly and increase activity/exercise as tolerated . Provided education to patient re: the health benefits from having a consistent routine home exercise regimen to help with cardio health and build stamina  . Discussed plans with patient for ongoing care management follow up and provided patient with direct contact information for care management team . Scheduled a follow up call with patient   Patient Self Care Activities:  . Self administers medications as prescribed . Attends all scheduled provider appointments . Calls pharmacy for medication refills . Attends church or other social activities . Performs ADL's independently . Performs IADL's independently . Calls provider office for new concerns or questions  Initial goal documentation    . "I need a refill for Clobetasol for my rash" (pt-stated)  Current Barriers:   Knowledge deficits related to indication for Clobetasol and etiology/type of skin rash . Ineffective health maintenance (patient is out of her Clobetasol used to treat skin rash)  Nurse Case Manager Clinical  Goal(s):  Over the next 30 days, patient will verbalize understanding of plan for pharmacological treatment plan for skin rash.  Interventions:   Telephone CCM outreach for initial RNCM follow up and goal setting . Evaluation of current treatment plan related to treatment of rash and patient's adherence to plan as established by provider . Reviewed medications with patient and discussed Clobetasol is not listed on her medication profile . Collaborated with Dr. Baird Cancer regarding patient's request for refill for her Clobetasol (per Dr. Baird Cancer patient will need to complete a virtual visit with Dr. Baird Cancer before refill can be authorized) . Informed patient of response by PCP re: need for PCP f/u  Discussed plans with patient for ongoing care management follow up and provided patient with direct contact information for care management team  Scheduled a CCM follow up visit with patient  Patient Self Care Activities:  . Self administers medications as prescribed . Attends all scheduled provider appointments . Calls pharmacy for medication refills . Attends church or other social activities . Performs ADL's independently . Performs IADL's independently . Calls provider office for new concerns or questions  Initial goal documentation      [name] was given information about Chronic Care Management services today including:  1. CCM service includes personalized support from designated clinical staff supervised by her physician, including individualized plan of care and coordination with other care providers 2. 24/7 contact phone numbers for assistance for urgent and routine care needs. 3. Service will only be billed when office clinical staff spend 20 minutes or more in a month to coordinate care. 4. Only one practitioner may furnish and bill the service in a calendar month. 5. The patient may stop CCM services at any time (effective at the end of the month) by phone call to the  office staff. 6. The patient will be responsible for cost sharing (co-pay) of up to 20% of the service fee (after annual deductible is met).  Patient agreed to services and verbal consent obtained.   The CCM team will reach out to the patient again over the next 2-3 weeks.   Barb Merino, RN,CCM Care Management Coordinator [place] [facility]  Direct Phone: [phone]

## 2018-06-07 ENCOUNTER — Ambulatory Visit (INDEPENDENT_AMBULATORY_CARE_PROVIDER_SITE_OTHER): Payer: Medicare HMO | Admitting: Internal Medicine

## 2018-06-07 ENCOUNTER — Encounter: Payer: Self-pay | Admitting: Internal Medicine

## 2018-06-07 VITALS — Temp 97.8°F | Ht 64.0 in | Wt 171.8 lb

## 2018-06-07 DIAGNOSIS — L900 Lichen sclerosus et atrophicus: Secondary | ICD-10-CM | POA: Diagnosis not present

## 2018-06-07 DIAGNOSIS — L2082 Flexural eczema: Secondary | ICD-10-CM

## 2018-06-07 DIAGNOSIS — K219 Gastro-esophageal reflux disease without esophagitis: Secondary | ICD-10-CM | POA: Diagnosis not present

## 2018-06-07 DIAGNOSIS — Z7189 Other specified counseling: Secondary | ICD-10-CM | POA: Diagnosis not present

## 2018-06-07 MED ORDER — CLOBETASOL PROPIONATE 0.05 % EX CREA: TOPICAL_CREAM | CUTANEOUS | 0 refills | Status: DC

## 2018-06-07 NOTE — Progress Notes (Addendum)
Virtual Visit via Telephone Note  t  This visit type was conducted due to national recommendations for restrictions regarding the COVID-19 Pandemic (e.g. social distancing).  This format is felt to be most appropriate for this patient at this time.  All issues noted in this document were discussed and addressed.  No physical exam was performed (except for noted visual exam findings with Video Visits).  Please refer to the patient's chart (MyChart message for video visits and phone note for telephone visits) for the patient's consent to telehealth for [facility].  Date:  06/07/2018   ID:  Wendy Mosley, DOB 03-06-1950, MRN [id number]  Patient Location:  Home  Provider location:   Office    Chief Complaint:  Rash  History of Present Illness:    Wendy Mosley is a 68 y.o. female who presents via audio conferencing for a telehealth visit today.    The patient does not symptoms concerning for COVID-19 infection (fever, chills, cough, or new shortness of breath).   She has requested a virtual video visit to discuss a rash she has. She is normally followed by [facility], [place]; however, she is currently out of the office.  She currently uses triamcinolone cream for eczema. Needs refill of clobetasol for a rash in her genital area. Described as red and itchy. She has been treated for this in the past. She is not sure what may have triggered her symptoms.      Past Medical History:  Diagnosis Date  . Cataracts, bilateral    right eye - just watching  . Diverticulitis   . GERD (gastroesophageal reflux disease) 2006  . Hearing loss    right ear - no hearing aid  . Heart attack ([place]) 2008  . Irregular heart beat 1973  . SVD (spontaneous vaginal delivery)    x 3   Past Surgical History:  Procedure Laterality Date  . APPENDECTOMY  1967  . BREAST SURGERY     cyst removed from right breast at age 89  . CHOLECYSTECTOMY  1975  . COLONOSCOPY    . DILATATION & CURETTAGE/HYSTEROSCOPY  WITH MYOSURE N/A 02/21/2018   Procedure: DILATATION & CURETTAGE/HYSTEROSCOPY WITH MYOSURE RESECTION OF ENDOMETRIAL POLYP;  Surgeon: Servando Salina, MD;  Location: [facility];  Service: Gynecology;  Laterality: N/A;  . EXTERNAL EAR SURGERY  1974  . TUBAL LIGATION  1977     Current Meds  Medication Sig  . aspirin 81 MG tablet Take 81 mg by mouth every other day.   . B COMPLEX-ZINC PO Take 1 tablet by mouth daily.  [name] ibuprofen (ADVIL,MOTRIN) 200 MG tablet Take 400-600 mg by mouth every 6 (six) hours as needed for moderate pain.  . Omega-3 Fatty Acids ([employer] FISH OIL) 1000 MG CAPS 3 tabs daily (Patient taking differently: Take 1,000 mg by mouth 2 (two) times daily. 3 tabs daily)  . omeprazole (PRILOSEC) 40 MG capsule Take 1 capsule (40 mg total) by mouth daily. (Patient taking differently: Take 40 mg by mouth daily as needed (heartburn). )  . triamcinolone cream (KENALOG) 0.5 % APPLY 1 APPLICATION TOPICALLY 2 (TWO) TIMES DAILY. APPLY TO RASH ON RT ARM AND SIDE  . [DISCONTINUED] hydroxypropyl methylcellulose / hypromellose (ISOPTO TEARS / GONIOVISC) 2.5 % ophthalmic solution Place 1 drop into both eyes daily as needed for dry eyes.     Allergies:   Sulfonamide derivatives   Social History   Tobacco Use  . Smoking status: Former Smoker    Packs/day: 1.00  Years: 30.00    Pack years: 30.00    Types: Cigarettes    Last attempt to quit: 02/01/2011    Years since quitting: 7.3  . Smokeless tobacco: Never Used  Substance Use Topics  . Alcohol use: Yes    Comment: occasional glass of wine  . Drug use: No    Comment: No IV drug     Family Hx: The patient's family history includes Asthma in her mother; Cancer in her maternal grandmother; Diabetes in her sister; Heart disease in her mother; Stroke in her mother.  ROS:   Please see the history of present illness.    Review of Systems  Constitutional: Negative.   Respiratory: Negative.   Cardiovascular: Negative.   Gastrointestinal:  Positive for heartburn.  Neurological: Negative.   Psychiatric/Behavioral: Negative.     All other systems reviewed and are negative.   Labs/Other Tests and Data Reviewed:    Recent Labs: 02/14/2018: Hemoglobin 12.0; Platelets 329   Recent Lipid Panel Lab Results  Component Value Date/Time   CHOL 212 (H) 07/06/2016 09:48 AM   TRIG 173 (H) 07/06/2016 09:48 AM   HDL 38 (L) 07/06/2016 09:48 AM   CHOLHDL 5.6 (H) 07/06/2016 09:48 AM   CHOLHDL 4.5 11/12/2015 11:38 AM   LDLCALC 139 (H) 07/06/2016 09:48 AM   LDLDIRECT 138 (H) 07/02/2011 03:21 PM    Wt Readings from Last 3 Encounters:  06/07/18 171 lb 12.8 oz (77.9 kg)  02/21/18 167 lb 1.7 oz (75.8 kg)  02/14/18 167 lb (75.8 kg)     Exam:    Vital Signs:  Temp 97.8 F (36.6 C) (Oral) Comment: pt provided Comment (Src): pt provided  Ht 5\' 4"  (1.626 m)   Wt 171 lb 12.8 oz (77.9 kg)   BMI 29.49 kg/m     Physical Exam  Nursing note and vitals reviewed.   ASSESSMENT & PLAN:    1. Lichen sclerosus  Chronic. She was given refill of clobetasol cream to apply to affected area twice daily as needed.  I will refer her to Gyn should her symptoms persist/worsen.   2. Flexural eczema  Chronic. She will continue with use of triamcinolone cream to apply to affected area as needed.   3. Gastroesophageal reflux disease without esophagitis  Chronic. Her medication list was reviewed in full.  She will continue with omeprazole daily. She is encouraged to stop eating 3 hours prior to lying down. She admits that she does not always practice this.  All questions were answered to her satisfaction. She is encouraged to contact me if she has any further concerns.    COVID-19 Education: The signs and symptoms of COVID-19 were discussed with the patient and how to seek care for testing (follow up with PCP or arrange E-visit).  The importance of social distancing was discussed today.  Patient Risk:   After full review of this patients  clinical status, I feel that they are at least moderate risk at this time.  Time:   Today, I have spent 12 minutes 36 seconds with the patient with telehealth technology discussing above diagnoses.     Medication Adjustments/Labs and Tests Ordered: Current medicines are reviewed at length with the patient today.  Concerns regarding medicines are outlined above.  Tests Ordered: No orders of the defined types were placed in this encounter.  Medication Changes: Meds ordered this encounter  Medications  . clobetasol cream (TEMOVATE) 0.05 %    Sig: Apply to affected area twice daily as needed  Dispense:  30 g    Refill:  0    Disposition:  Follow up prn  Signed, Maximino Greenland, MD

## 2018-06-20 ENCOUNTER — Ambulatory Visit: Payer: Self-pay

## 2018-06-20 DIAGNOSIS — I10 Essential (primary) hypertension: Secondary | ICD-10-CM

## 2018-06-20 DIAGNOSIS — K219 Gastro-esophageal reflux disease without esophagitis: Secondary | ICD-10-CM

## 2018-06-20 DIAGNOSIS — E785 Hyperlipidemia, unspecified: Secondary | ICD-10-CM

## 2018-06-20 NOTE — Patient Instructions (Addendum)
Social Worker Visit Information  Goals we discussed today:  Goals Addressed            This Visit's Progress     Patient Stated   . "I want to learn aboout advance directives" (pt-stated)   On track    Current Barriers:  . Limited education about the importance of naming a healthcare power of attorney  Clinical Social Work Clinical Goal(s):  [name] Over the next 30 days, patient will verbalize basic understanding of Advanced Directives and importance of completion . Over the next 30 days, the patient will complete mailed Advance Directive packet with the assistance of SW  Interventions: . Telephonic follow up call by CCM SW . Confirmed receipt of AD previously mailed to the patients home . Educated the patient on the document including the importance of providing a notarized copy to her provider . Planned follow up call to the patient in the next month  Patient Self Care Activities:  . Self administers medications as prescribed . Attends all scheduled provider appointments . Calls provider office for new concerns or questions  Please see past updates related to this goal by clicking on the "Past Updates" button in the selected goal             Materials Provided: Verbal education about advance directives provided by phone  Follow Up Plan: SW will follow up with patient by phone over the next month.   Daneen Schick, BSW, CDP [facility] Care Management Social Worker [phone]

## 2018-06-20 NOTE — Chronic Care Management (AMB) (Signed)
  Chronic Care Management    Clinical Social Work Follow Up Note  06/20/2018 Name: Wendy Mosley MRN: [id number] DOB: [birth year]  Wendy Mosley is a 68 y.o. year old female who is a primary care patient of Glendale Chard, MD. The CCM team was consulted for assistance with chronic care management.   Review of patient status, including review of consultants reports, other relevant assessments, and collaboration with appropriate care team members and the patient's provider was performed as part of comprehensive patient evaluation and provision of chronic care management services.     I placed a follow up call to the patient to confirm receipt of mailed resource and provide advance directive education.   Goals Addressed            This Visit's Progress     Patient Stated   . "I want to learn aboout advance directives" (pt-stated)   On track    Current Barriers:  . Limited education about the importance of naming a healthcare power of attorney  Clinical Social Work Clinical Goal(s):  [name] Over the next 30 days, patient will verbalize basic understanding of Advanced Directives and importance of completion . Over the next 30 days, the patient will complete mailed Advance Directive packet with the assistance of SW  Interventions: . Telephonic follow up call by CCM SW . Confirmed receipt of AD previously mailed to the patients home . Educated the patient on the document including the importance of providing a notarized copy to her provider . Planned follow up call to the patient in the next month  Patient Self Care Activities:  . Self administers medications as prescribed . Attends all scheduled provider appointments . Calls provider office for new concerns or questions  Please see past updates related to this goal by clicking on the "Past Updates" button in the selected goal        Follow Up Plan: SW will follow up with patient by phone over the next month.   Daneen Schick, BSW, CDP [facility] Care Management Social Worker [phone]  Total time spent performing care coordination and/or care management activities with the patient by phone or face to face = 20 minutes.

## 2018-06-22 ENCOUNTER — Telehealth: Payer: Self-pay

## 2018-06-27 ENCOUNTER — Telehealth: Payer: Self-pay

## 2018-06-27 ENCOUNTER — Ambulatory Visit: Payer: Self-pay

## 2018-06-27 DIAGNOSIS — I10 Essential (primary) hypertension: Secondary | ICD-10-CM

## 2018-06-27 DIAGNOSIS — E785 Hyperlipidemia, unspecified: Secondary | ICD-10-CM

## 2018-06-27 DIAGNOSIS — K219 Gastro-esophageal reflux disease without esophagitis: Secondary | ICD-10-CM

## 2018-06-27 NOTE — Chronic Care Management (AMB) (Signed)
  Chronic Care Management   Outreach Note  06/27/2018 Name: Wendy Mosley MRN: [id number] DOB: 1950-04-23  Referred by: Glendale Chard, MD Reason for referral : Chronic Care Management (CCM Telephone Follow Up)   An unsuccessful telephone outreach was attempted today. I spoke to Wendy Mosley, however, she requested a call back at a later time. The patient was referred to the case management team by her health plan and is a primary care patient of Glendale Chard MD. The CCM team was consulted for assistance with chronic care management.   Follow Up Plan: The CM team will reach out to the patient again over the next 2-3  days.    Barb Merino, RN,CCM Care Management Coordinator [place] [facility]  Direct Phone: [phone]

## 2018-06-30 ENCOUNTER — Telehealth: Payer: Self-pay

## 2018-07-18 ENCOUNTER — Ambulatory Visit: Payer: Self-pay

## 2018-07-18 ENCOUNTER — Other Ambulatory Visit: Payer: Self-pay

## 2018-07-18 ENCOUNTER — Telehealth: Payer: Self-pay

## 2018-07-18 DIAGNOSIS — I10 Essential (primary) hypertension: Secondary | ICD-10-CM

## 2018-07-18 DIAGNOSIS — L900 Lichen sclerosus et atrophicus: Secondary | ICD-10-CM

## 2018-07-18 DIAGNOSIS — K219 Gastro-esophageal reflux disease without esophagitis: Secondary | ICD-10-CM

## 2018-07-18 DIAGNOSIS — R21 Rash and other nonspecific skin eruption: Secondary | ICD-10-CM

## 2018-07-18 DIAGNOSIS — E785 Hyperlipidemia, unspecified: Secondary | ICD-10-CM

## 2018-07-18 NOTE — Chronic Care Management (AMB) (Signed)
Chronic Care Management   Follow Up Note   07/18/2018 Name: DAVIS VANNATTER MRN: [id number] DOB: Jan 08, 1951  Referred by: Glendale Chard, MD Reason for referral : Chronic Care Management (CCM RN Telephone Follow Up )   LORETTO BELINSKY is a 68 y.o. year old female who is a primary care patient of Glendale Chard, MD. The CCM team was consulted for assistance with chronic disease management and care coordination needs.    Review of patient status, including review of consultants reports, relevant laboratory and other test results, and collaboration with appropriate care team members and the patient's provider was performed as part of comprehensive patient evaluation and provision of chronic care management services.    I spoke with [name] by telephone today for a CCM follow up.   Goals Addressed      Patient Stated   . "I have pressure and intermittent pain in my lower abdomen" (pt-stated)   Not on track    Current Barriers:  [name] Knowledge Deficits related to etiology for lower abdominal pain  Nurse Case Manager Clinical Goal(s):  [name] Over the next 60 days, patient will work with Designer, fashion/clothing  to address needs related to unresolved uterine pain  Interventions:   Completed CCM RN Telephone Follow up with patient . Evaluation of current treatment plan related to uterine pain and patient's adherence to plan as established by provider . Assessed for persistent or worsening lower abdominal pain/discomfort - pt states its about the same . Reinforced for patient to schedule a follow up visit with her established GYN to further evaluate unresolved uterine pain-pt plans to schedule after COVID-19 has resolved . Discussed plans with patient for ongoing care management follow up and provided patient with direct contact information for care management team . Scheduled a CCM follow up call with patient for 2-3 weeks  Patient Self Care Activities:  . Self administers  medications as prescribed . Attends all scheduled provider appointments . Calls pharmacy for medication refills . Attends church or other social activities . Performs ADL's independently . Performs IADL's independently . Calls provider office for new concerns or questions  Please see past updates related to this goal by clicking on the "Past Updates" button in the selected goal       . "I know I need to start exercising again" (pt-stated)   On track    Current Barriers:  [name] Knowledge Deficits related to health benefits associated with physical exercise  Nurse Case Manager Clinical Goal(s):  [name] Over the next 60 days, patient will verbalize having a plan for implementing a structured home exercise plan. . 07/18/18-Goal date revised to 60 days due to health related delays secondary to COVID-19  Interventions:   Completed CCM RN Telephone Follow Up with patient  . Evaluation of current treatment plan related to home exercise plan and patient's adherence to plan as established by provider . Reinforced to start off slowly and increase activity/exercise as tolerated - pt is walking outside her home with granddaughter occasionally "going down the driveway and back" . Reinforced health benefits from having a consistent routine home exercise regimen to help with cardio health and build stamina  . Mailed printed patient ed mats related to healthy eating and benefits of exercise . Discussed plans with patient for ongoing care management follow up and provided patient with direct contact information for care management team . Scheduled a follow up call with patient for about 2-3 weeks  Patient Self Care Activities:  .  Self administers medications as prescribed . Attends all scheduled provider appointments . Calls pharmacy for medication refills . Attends church or other social activities . Performs ADL's independently . Performs IADL's independently . Calls provider office for new concerns or  questions  Please see past updates related to this goal by clicking on the "Past Updates" button in the selected goal     . COMPLETED: "I need a refill for Clobetasol for my rash" (pt-stated)       Current Barriers:   Knowledge deficits related to indication for Clobetasol and etiology/type of skin rash . Ineffective health maintenance (patient is out of her Clobetasol used to treat skin rash)  Nurse Case Manager Clinical Goal(s):  Over the next 30 days, patient will verbalize understanding of plan for pharmacological treatment plan for skin rash. -GOAL MET  Interventions:   Completed CCM RN Telephone outreach with patient  Assessed for usage and refill of Clobetasol and or other topical pharmacological's prescribed by Dr. Baird Cancer following virtual visit-pt filled and is using as directed  Assessed for further questions or concerns related to skin rash and or skin care treatment prescribed- pt denies   Patient Self Care Activities:  . Self administers medications as prescribed . Attends all scheduled provider appointments . Calls pharmacy for medication refills . Attends church or other social activities . Performs ADL's independently . Performs IADL's independently . Calls provider office for new concerns or questions  Please see past updates related to this goal by clicking on the "Past Updates" button in the selected goal       Other   . Identify patient Care Coordination and Disease Management Needs   On track    Current Barriers:   Knowledge Deficits related to care coordination needs and chronic disease management needs  Nurse Case Manager Clinical Goal(s):   Over the next 90 days, patient will collaborate with care management team to address care coordination and chronic disease management needs related to Disease Management for medical comorbidities.   Interventions:   One on one collaboration establish plan of care to address care coordination and disease  management needs with clinical colleague Daneen Schick BSW.   Patient Self Care Activities:   Currently UNABLE TO independently self manage needs related to chronic health conditions.   Initial goal documentation         The CCM team will reach out to the patient again over the next 2-3 weeks.    Barb Merino, RN,CCM Care Management Coordinator [place] [facility]  Direct Phone: [phone]

## 2018-07-21 ENCOUNTER — Ambulatory Visit: Payer: Self-pay

## 2018-07-21 DIAGNOSIS — I10 Essential (primary) hypertension: Secondary | ICD-10-CM

## 2018-07-21 DIAGNOSIS — E785 Hyperlipidemia, unspecified: Secondary | ICD-10-CM

## 2018-07-21 NOTE — Chronic Care Management (AMB) (Signed)
  Chronic Care Management    Clinical Social Work Follow Up Note  07/21/2018 Name: Wendy Mosley MRN: [id number] DOB: [birth year]  Wendy Mosley is a 68 y.o. year old female who is a primary care patient of Glendale Chard, MD. The CCM team was consulted for assistance with [employer].   Review of patient status, including review of consultants reports, other relevant assessments, and collaboration with appropriate care team members and the patient's provider was performed as part of comprehensive patient evaluation and provision of chronic care management services.     I placed a follow up call to the patient on today's date to assess progress of patient stated SW goal.  Goals Addressed            This Visit's Progress     Patient Stated   . COMPLETED: "I want to learn aboout advance directives" (pt-stated)       Current Barriers:  . Limited education about the importance of naming a healthcare power of attorney  Clinical Social Work Clinical Goal(s):  [name] Over the next 30 days, patient will verbalize basic understanding of Advanced Directives and importance of completion . Over the next 30 days, the patient will complete mailed Advance Directive packet with the assistance of SW  Interventions: . Telephonic follow up call by CCM SW . Assessed for questions regarding Advance Directive forms  . Determined the patient has yet to complete the form but does not identify new questions surrounding the document . Informed the patient if she would like to complete the document she may call CCM SW for assistance or follow up with her provider at her next scheduled appointment  Patient Self Care Activities:  . Self administers medications as prescribed . Attends all scheduled provider appointments . Calls provider office for new concerns or questions  Please see past updates related to this goal by clicking on the "Past Updates" button in the selected goal         Follow Up Plan: No CCM SW follow up planned at this time. Patient is encouraged to contact SW if future needs arise.   Daneen Schick, BSW, CDP [facility] / [facility] Care Management Social Worker [phone]  Total time spent performing care coordination and/or care management activities with the patient by phone or face to face = 9 minutes.

## 2018-07-21 NOTE — Patient Instructions (Signed)
Social Worker Visit Information  Goals we discussed today:  Goals Addressed            This Visit's Progress     Patient Stated   . COMPLETED: "I want to learn aboout advance directives" (pt-stated)       Current Barriers:  . Limited education about the importance of naming a healthcare power of attorney  Clinical Social Work Clinical Goal(s):  [name] Over the next 30 days, patient will verbalize basic understanding of Advanced Directives and importance of completion . Over the next 30 days, the patient will complete mailed Advance Directive packet with the assistance of SW  Interventions: . Telephonic follow up call by CCM SW . Assessed for questions regarding Advance Directive forms  . Determined the patient has yet to complete the form but does not identify new questions surrounding the document . Informed the patient if she would like to complete the document she may call CCM SW for assistance or follow up with her provider at her next scheduled appointment  Patient Self Care Activities:  . Self administers medications as prescribed . Attends all scheduled provider appointments . Calls provider office for new concerns or questions  Please see past updates related to this goal by clicking on the "Past Updates" button in the selected goal              Materials Provided: No: Patient declined  Follow Up Plan: No further SW follow up planned  Daneen Schick, [place], CDP [facility] [place] [facility] Social Worker [phone]

## 2018-07-26 ENCOUNTER — Ambulatory Visit: Payer: Self-pay

## 2018-07-26 ENCOUNTER — Ambulatory Visit (INDEPENDENT_AMBULATORY_CARE_PROVIDER_SITE_OTHER): Payer: Medicare HMO | Admitting: Nurse Practitioner

## 2018-07-26 ENCOUNTER — Encounter: Payer: Self-pay | Admitting: Nurse Practitioner

## 2018-07-26 ENCOUNTER — Other Ambulatory Visit: Payer: Self-pay

## 2018-07-26 ENCOUNTER — Telehealth: Payer: Self-pay

## 2018-07-26 VITALS — Temp 99.4°F | Ht 64.0 in | Wt 171.3 lb

## 2018-07-26 DIAGNOSIS — Z20828 Contact with and (suspected) exposure to other viral communicable diseases: Secondary | ICD-10-CM

## 2018-07-26 DIAGNOSIS — I10 Essential (primary) hypertension: Secondary | ICD-10-CM

## 2018-07-26 DIAGNOSIS — E785 Hyperlipidemia, unspecified: Secondary | ICD-10-CM

## 2018-07-26 DIAGNOSIS — Z20822 Contact with and (suspected) exposure to covid-19: Secondary | ICD-10-CM

## 2018-07-26 DIAGNOSIS — R509 Fever, unspecified: Secondary | ICD-10-CM

## 2018-07-26 NOTE — Telephone Encounter (Signed)
Called to schedule virtual appt for asap- no answer left message

## 2018-07-26 NOTE — Progress Notes (Signed)
Virtual Visit via telephone     This visit type was conducted due to national recommendations for restrictions regarding the COVID-19 Pandemic (e.g. social distancing) in an effort to limit this patient's exposure and mitigate transmission in our community.  Patients identity confirmed using two different identifiers.  This format is felt to be most appropriate for this patient at this time.  All issues noted in this document were discussed and addressed.  No physical exam was performed (except for noted visual exam findings with Video Visits).    Date:  07/26/2018   ID:  Wendy Mosley, DOB Aug 06, 1950, MRN [id number]  Patient Location:  Home - spoke with [name]   Provider location:   Office    Chief Complaint:  COVID-19 exposure - grandson's mother   History of Present Illness:    Wendy Mosley is a 68 y.o. female who presents via video conferencing for a telehealth visit today.    The patient does have symptoms concerning for COVID-19 infection (fever, chills, cough, or new shortness of breath).   The patient was exposed to her grandson's mother who was tested positive on Friday. The patient had exposure with this person on  May 16th.  She does not have any cough, chills, fever, or new shortness of breath.      Past Medical History:  Diagnosis Date  . Cataracts, bilateral    right eye - just watching  . Diverticulitis   . GERD (gastroesophageal reflux disease) 2006  . Hearing loss    right ear - no hearing aid  . Heart attack ([place]) 2008  . Irregular heart beat 1973  . SVD (spontaneous vaginal delivery)    x 3   Past Surgical History:  Procedure Laterality Date  . APPENDECTOMY  1967  . BREAST SURGERY     cyst removed from right breast at age 2  . CHOLECYSTECTOMY  1975  . COLONOSCOPY    . DILATATION & CURETTAGE/HYSTEROSCOPY WITH MYOSURE N/A 02/21/2018   Procedure: DILATATION & CURETTAGE/HYSTEROSCOPY WITH MYOSURE RESECTION OF ENDOMETRIAL  POLYP;  Surgeon: Servando Salina, MD;  Location: [facility];  Service: Gynecology;  Laterality: N/A;  . EXTERNAL EAR SURGERY  1974  . TUBAL LIGATION  1977     Current Meds  Medication Sig  . aspirin 81 MG tablet Take 81 mg by mouth every other day.   . B COMPLEX-ZINC PO Take 1 tablet by mouth daily.  . clobetasol cream (TEMOVATE) 0.05 % Apply to affected area twice daily as needed  . ibuprofen (ADVIL,MOTRIN) 200 MG tablet Take 400-600 mg by mouth every 6 (six) hours as needed for moderate pain.  . Omega-3 Fatty Acids ([employer] FISH OIL) 1000 MG CAPS 3 tabs daily (Patient taking differently: Take 1,000 mg by mouth 2 (two) times daily. 3 tabs daily)  . omeprazole (PRILOSEC) 40 MG capsule Take 1 capsule (40 mg total) by mouth daily. (Patient taking differently: Take 40 mg by mouth daily as needed (heartburn). )  . triamcinolone cream (KENALOG) 0.5 % APPLY 1 APPLICATION TOPICALLY 2 (TWO) TIMES DAILY. APPLY TO RASH ON RT ARM AND SIDE     Allergies:   Sulfonamide derivatives   Social History   Tobacco Use  . Smoking status: Former Smoker    Packs/day: 1.00    Years: 30.00    Pack years: 30.00    Types: Cigarettes    Last attempt to quit: 02/01/2011    Years since quitting: 7.4  . Smokeless tobacco: Never Used  Substance Use Topics  . Alcohol use: Yes    Comment: occasional glass of wine  . Drug use: No    Comment: No IV drug     Family Hx: The patient's family history includes Asthma in her mother; Cancer in her maternal grandmother; Diabetes in her sister; Heart disease in her mother; Stroke in her mother.  ROS:   Please see the history of present illness.    Review of Systems  Constitutional: Positive for fever. Negative for chills and malaise/fatigue.  Respiratory: Negative.   Cardiovascular: Negative.  Negative for chest pain.  Musculoskeletal: Negative.   Neurological: Negative for dizziness and tingling.  Psychiatric/Behavioral: Negative.     All other systems reviewed and  are negative.   Labs/Other Tests and Data Reviewed:    Recent Labs: 02/14/2018: Hemoglobin 12.0; Platelets 329   Recent Lipid Panel Lab Results  Component Value Date/Time   CHOL 212 (H) 07/06/2016 09:48 AM   TRIG 173 (H) 07/06/2016 09:48 AM   HDL 38 (L) 07/06/2016 09:48 AM   CHOLHDL 5.6 (H) 07/06/2016 09:48 AM   CHOLHDL 4.5 11/12/2015 11:38 AM   LDLCALC 139 (H) 07/06/2016 09:48 AM   LDLDIRECT 138 (H) 07/02/2011 03:21 PM    Wt Readings from Last 3 Encounters:  07/26/18 171 lb 4.8 oz (77.7 kg)  06/07/18 171 lb 12.8 oz (77.9 kg)  02/21/18 167 lb 1.7 oz (75.8 kg)     Exam:    Vital Signs:  Temp 99.4 F (37.4 C) (Oral)   Ht 5\' 4"  (1.626 m)   Wt 171 lb 4.8 oz (77.7 kg)   BMI 29.40 kg/m     Physical Exam  Constitutional: She is oriented to person, place, and time. No distress.  Neurological: She is alert and oriented to person, place, and time.  Telephone visit due to unable to work her video on cell phone  ASSESSMENT & PLAN:   1. Exposure to Covid-19 Virus  Exposed to grandson's mother who tested positive on Friday, the exposure was on May 16th.  Today she has a fever of 101.5, she is advised to take tylenol as needed.  Will call [facility] to schedule appt to be checked.  I have advised her to Samule Dry herself in the home until she has a negative test or without symptoms for 3 days.      COVID-19 Education: The signs and symptoms of COVID-19 were discussed with the patient and how to seek care for testing (follow up with PCP or arrange E-visit).  The importance of social distancing was discussed today.  Patient Risk:   After full review of this patients clinical status, I feel that they are at least moderate risk at this time.  Time:   Today, I have spent 10 minutes/ seconds with the patient with telehealth technology discussing above diagnoses.     Medication Adjustments/Labs and Tests Ordered: Current medicines are reviewed at length with the patient today.   Concerns regarding medicines are outlined above.   Tests Ordered: No orders of the defined types were placed in this encounter.   Medication Changes: No orders of the defined types were placed in this encounter.   Disposition:  Follow up prn  Signed, [name], FNP

## 2018-07-26 NOTE — Chronic Care Management (AMB) (Signed)
  Chronic Care Management   Follow Up Note   07/26/2018 Name: MAGGY WYBLE MRN: [id number] DOB: [birth year]  Referred by: Glendale Chard, MD Reason for referral : Chronic Care Management (CCM RN CM Follow Up )   SYLVIE MIFSUD is a 68 y.o. year old female who is a primary care patient of Glendale Chard, MD. The CCM team was consulted for assistance with chronic disease management and care coordination needs.    Review of patient status, including review of consultants reports, relevant laboratory and other test results, and collaboration with appropriate care team members and the patient's provider was performed as part of comprehensive patient evaluation and provision of chronic care management services.    I received a voice message from Mrs. [name] this morning stating she is concerned that she may have COVID-19 after having a known exposure to a family member who tested positive. She requested a call back to please advise.   Afterwards, I received a secure message from the embedded BSW Daneen Schick saying she too received a call from the patient and instructed the patient to schedule a virtual visit with her provider [name], [place] today.   Per review of chart, noted Mrs. [name] completed a virtual visit with [name], FNP today as instructed by embedded [facility].     Goals Addressed    . "I have been exposed to COVID-19"       Current Barriers:  [name] Knowledge Deficits related to treatment management for COVID-19  Nurse Case Manager Clinical Goal(s):  [name] Over the next 30 days, patient will work with [facility] and provider [name], FNP to address needs related to COVID-19  CCM RN CM Interventions:  Completed on 07/26/18  . Evaluation of current treatment plan related to COVID-19 and patient's adherence to plan as established by provider.  . Scheduled RNCM telephone follow-up call with patient for 07/29/18  Patient Self Care Activities:  . Self  administers medications as prescribed . Attends all scheduled provider appointments . Calls provider office for new concerns or questions  Initial goal documentation       Telephone follow up appointment with CCM team member scheduled for: 07/29/18   Barb Merino, [facility] Care Management Coordinator [place] [facility]  Direct Phone: [phone]

## 2018-07-26 NOTE — Chronic Care Management (AMB) (Signed)
  Chronic Care Management   Social Work Note  07/26/2018 Name: Wendy Mosley MRN: [id number] DOB: 10/06/1950  SW received a voice message from the patient reporting she has been exposed to [place] and was wondering what her next step should be.  SW returned call to the patient. Patient reports having a cookout on May 16th for her birthday. The patient discovered on Sunday May 24th that she had hugged a guest who has tested positive for the virus. The patient is unsure when the guest received the positive result but reports the guest had recently returned home from an overseas trip to [place] prior to attending the cookout. The patient denies shortness of breath but does acknowledge a cough. The patient attributes this cough to allergies.  SW advised the patient to go directly to the ED if she begins experiencing shortness of breath. SW informed the patient this Probation officer would collaborate with her provider regarding a virtual visit to assess for symptoms and determine if the patient needs to be tested at this time  SW collaborated with Dr. Baird Cancer via in basket message. Dr. Baird Cancer reports she will have the office to outreach the patient regarding a virtual visit.   Daneen Schick, BSW, CDP [facility] / [facility] Care Management Social Worker [phone]  Total time spent performing care coordination and/or care management activities with the patient by phone or face to face = 7 minutes.

## 2018-07-27 ENCOUNTER — Telehealth: Payer: Self-pay

## 2018-07-27 ENCOUNTER — Other Ambulatory Visit: Payer: Medicare HMO

## 2018-07-27 DIAGNOSIS — Z1231 Encounter for screening mammogram for malignant neoplasm of breast: Secondary | ICD-10-CM

## 2018-07-27 DIAGNOSIS — R6889 Other general symptoms and signs: Secondary | ICD-10-CM | POA: Diagnosis not present

## 2018-07-27 DIAGNOSIS — Z20822 Contact with and (suspected) exposure to covid-19: Secondary | ICD-10-CM

## 2018-07-27 NOTE — Telephone Encounter (Signed)
I called [facility] to get pt scheduled to be tested for COVID I spoke with Magda Paganini and she stated she will call pt to get her scheduled at their [place] location. Patient aware that they will be calling her. YRL,RMA

## 2018-07-27 NOTE — Telephone Encounter (Signed)
[facility] requests COVID 19 testing.

## 2018-07-29 ENCOUNTER — Telehealth: Payer: Self-pay

## 2018-07-29 ENCOUNTER — Other Ambulatory Visit: Payer: Self-pay

## 2018-07-29 ENCOUNTER — Ambulatory Visit (INDEPENDENT_AMBULATORY_CARE_PROVIDER_SITE_OTHER): Payer: Medicare HMO

## 2018-07-29 DIAGNOSIS — E785 Hyperlipidemia, unspecified: Secondary | ICD-10-CM

## 2018-07-29 DIAGNOSIS — Z20822 Contact with and (suspected) exposure to covid-19: Secondary | ICD-10-CM

## 2018-07-29 DIAGNOSIS — I10 Essential (primary) hypertension: Secondary | ICD-10-CM

## 2018-07-29 DIAGNOSIS — Z20828 Contact with and (suspected) exposure to other viral communicable diseases: Secondary | ICD-10-CM

## 2018-07-29 LAB — NOVEL CORONAVIRUS, NAA: SARS-CoV-2, NAA: NOT DETECTED

## 2018-07-29 NOTE — Chronic Care Management (AMB) (Signed)
  Chronic Care Management   Follow Up Note   07/29/2018 Name: Wendy Mosley MRN: [id number] DOB: 03-Aug-1950  Referred by: Glendale Chard, MD Reason for referral : Chronic Care Management (CCM RN CM Telephone Follow Up )   Wendy Mosley is a 68 y.o. year old female who is a primary care patient of Glendale Chard, MD. The CCM team was consulted for assistance with chronic disease management and care coordination needs.    Review of patient status, including review of consultants reports, relevant laboratory and other test results, and collaboration with appropriate care team members and the patient's provider was performed as part of comprehensive patient evaluation and provision of chronic care management services.    I spoke with Wendy Mosley by telephone today to f/u on her COVID-19 testing.   Goals Addressed    . "I have been exposed to COVID-19"       Current Barriers:  [name] Knowledge Deficits related to treatment management for COVID-19  Nurse Case Manager Clinical Goal(s):  [name] Over the next 30 days, patient will work with [facility] and provider [name], FNP to address needs related to COVID-19  CCM RN CM Interventions:  Completed on 07/29/18, call completed with paitent  . Evaluation of current treatment plan related to COVID-19 and patient's adherence to plan as established by provider.  . Discussed patient's OTLXB-26 results, pt reports per my chart results are NEGATIVE . Assessed for s/s suggestive of COVID-19, pt denies . Assessed for questions or concerns related to COVID-19, pt denies . Reinforced following social distancing, wearing a mask and using good handwashing to help avoid getting or spreading virus, discussed monitoring temperature at home to help identify early onset of the virus, discussed when to call the provider if needed, pt verbalizes understanding . Scheduled RNCM telephone follow-up call with patient for 2 weeks  Patient Self Care Activities:   . Self administers medications as prescribed . Attends all scheduled provider appointments . Calls provider office for new concerns or questions  Please see past updates related to this goal by clicking on the "Past Updates" button in the selected goal           Telephone follow up appointment with care management team member scheduled for: 2 weeks   Barb Merino, [facility] Care Management Coordinator [place] [facility]  Direct Phone: [phone]

## 2018-07-29 NOTE — Patient Instructions (Signed)
Visit Information  Goals Addressed    . "I have been exposed to COVID-19"       Current Barriers:  [name] Knowledge Deficits related to treatment management for COVID-19  Nurse Case Manager Clinical Goal(s):  [name] Over the next 30 days, patient will work with [facility] and provider [name], FNP to address needs related to COVID-19  CCM RN CM Interventions:  Completed on 07/29/18, call completed with paitent  . Evaluation of current treatment plan related to COVID-19 and patient's adherence to plan as established by provider.  . Discussed patient's [id number]-[id number] results, pt reports per my chart results are NEGATIVE . Assessed for s/s suggestive of COVID-19, pt denies . Assessed for questions or concerns related to COVID-19, pt denies . Reinforced following social distancing, wearing a mask and using good handwashing to help avoid getting or spreading virus, discussed monitoring temperature at home to help identify early onset of the virus, discussed when to call the provider if needed, pt verbalizes understanding . Scheduled RNCM telephone follow-up call with patient for 2 weeks  Patient Self Care Activities:  . Self administers medications as prescribed . Attends all scheduled provider appointments . Calls provider office for new concerns or questions  Please see past updates related to this goal by clicking on the "Past Updates" button in the selected goal        The patient verbalized understanding of instructions provided today and declined a print copy of patient instruction materials.   Telephone follow up appointment with care management team member scheduled for: 2 weeks  Barb Merino, [facility] Care Management Coordinator [facility]  Direct Phone: [phone]

## 2018-08-01 ENCOUNTER — Telehealth: Payer: Self-pay

## 2018-08-12 ENCOUNTER — Telehealth: Payer: Self-pay

## 2018-09-09 ENCOUNTER — Telehealth: Payer: Self-pay

## 2018-09-15 ENCOUNTER — Ambulatory Visit (INDEPENDENT_AMBULATORY_CARE_PROVIDER_SITE_OTHER): Payer: Medicare HMO

## 2018-09-15 ENCOUNTER — Other Ambulatory Visit: Payer: Self-pay

## 2018-09-15 ENCOUNTER — Encounter: Payer: Self-pay | Admitting: Internal Medicine

## 2018-09-15 ENCOUNTER — Ambulatory Visit (INDEPENDENT_AMBULATORY_CARE_PROVIDER_SITE_OTHER): Payer: Medicare HMO | Admitting: Internal Medicine

## 2018-09-15 VITALS — BP 130/78 | HR 87 | Temp 98.1°F | Ht 63.4 in | Wt 177.4 lb

## 2018-09-15 DIAGNOSIS — Z1211 Encounter for screening for malignant neoplasm of colon: Secondary | ICD-10-CM

## 2018-09-15 DIAGNOSIS — H65111 Acute and subacute allergic otitis media (mucoid) (sanguinous) (serous), right ear: Secondary | ICD-10-CM

## 2018-09-15 DIAGNOSIS — Z1239 Encounter for other screening for malignant neoplasm of breast: Secondary | ICD-10-CM | POA: Diagnosis not present

## 2018-09-15 DIAGNOSIS — I10 Essential (primary) hypertension: Secondary | ICD-10-CM

## 2018-09-15 DIAGNOSIS — Z0000 Encounter for general adult medical examination without abnormal findings: Secondary | ICD-10-CM

## 2018-09-15 DIAGNOSIS — E2839 Other primary ovarian failure: Secondary | ICD-10-CM

## 2018-09-15 DIAGNOSIS — E785 Hyperlipidemia, unspecified: Secondary | ICD-10-CM | POA: Diagnosis not present

## 2018-09-15 DIAGNOSIS — Z0001 Encounter for general adult medical examination with abnormal findings: Secondary | ICD-10-CM

## 2018-09-15 DIAGNOSIS — K5900 Constipation, unspecified: Secondary | ICD-10-CM | POA: Insufficient documentation

## 2018-09-15 LAB — POCT URINALYSIS DIPSTICK
Bilirubin, UA: NEGATIVE
Blood, UA: NEGATIVE
Glucose, UA: NEGATIVE
Ketones, UA: NEGATIVE
Leukocytes, UA: NEGATIVE
Nitrite, UA: NEGATIVE
Protein, UA: NEGATIVE
Spec Grav, UA: >=1.03 — AB (ref 1.010–1.025)
Urobilinogen, UA: 0.2 E.U./dL
pH, UA: 5 (ref 5.0–8.0)

## 2018-09-15 LAB — POC HEMOCCULT BLD/STL (OFFICE/1-CARD/DIAGNOSTIC): Fecal Occult Blood, POC: NEGATIVE

## 2018-09-15 LAB — POCT UA - MICROALBUMIN
Albumin/Creatinine Ratio, Urine, POC: <30
Creatinine, POC: 300 mg/dL
Microalbumin Ur, POC: 10 mg/L

## 2018-09-15 NOTE — Patient Instructions (Addendum)
As part of prevention Covid and to help your immune system do the following: Avoid seed oils like canola or vegetable oil which causes more inflammation in your body and makes you more susceptible to get viral infections. Avoid processed foods which are cooked in those type oils.  Take at least 2,000 IU of vit D, Zing 25 mg per day, and vit C [id number] mg a day  Your weight is in the obesity range. Work on loosing wt.   Preventive Care 89 Years and Older, Female Preventive care refers to lifestyle choices and visits with your health care provider that can promote health and wellness. This includes:  A yearly physical exam. This is also called an annual well check.  Regular dental and eye exams.  Immunizations.  Screening for certain conditions.  Healthy lifestyle choices, such as diet and exercise. What can I expect for my preventive care visit? Physical exam Your health care provider will check:  Height and weight. These may be used to calculate body mass index (BMI), which is a measurement that tells if you are at a healthy weight.  Heart rate and blood pressure.  Your skin for abnormal spots. Counseling Your health care provider may ask you questions about:  Alcohol, tobacco, and drug use.  Emotional well-being.  Home and relationship well-being.  Sexual activity.  Eating habits.  History of falls.  Memory and ability to understand (cognition).  Work and work Statistician.  Pregnancy and menstrual history. What immunizations do I need?  Influenza (flu) vaccine  This is recommended every year. Tetanus, diphtheria, and pertussis (Tdap) vaccine  You may need a Td booster every 10 years. Varicella (chickenpox) vaccine  You may need this vaccine if you have not already been vaccinated. Zoster (shingles) vaccine  You may need this after age 35. Pneumococcal conjugate (PCV13) vaccine  One dose is recommended after age 3. Pneumococcal polysaccharide (PPSV23)  vaccine  One dose is recommended after age 58. Measles, mumps, and rubella (MMR) vaccine  You may need at least one dose of MMR if you were born in 1957 or later. You may also need a second dose. Meningococcal conjugate (MenACWY) vaccine  You may need this if you have certain conditions. Hepatitis A vaccine  You may need this if you have certain conditions or if you travel or work in places where you may be exposed to hepatitis A. Hepatitis B vaccine  You may need this if you have certain conditions or if you travel or work in places where you may be exposed to hepatitis B. Haemophilus influenzae type b (Hib) vaccine  You may need this if you have certain conditions. You may receive vaccines as individual doses or as more than one vaccine together in one shot (combination vaccines). Talk with your health care provider about the risks and benefits of combination vaccines. What tests do I need? Blood tests  Lipid and cholesterol levels. These may be checked every 5 years, or more frequently depending on your overall health.  Hepatitis C test.  Hepatitis B test. Screening  Lung cancer screening. You may have this screening every year starting at age 30 if you have a 30-pack-year history of smoking and currently smoke or have quit within the past 15 years.  Colorectal cancer screening. All adults should have this screening starting at age 17 and continuing until age 61. Your health care provider may recommend screening at age 67 if you are at increased risk. You will have tests every 1-10 years,  depending on your results and the type of screening test.  Diabetes screening. This is done by checking your blood sugar (glucose) after you have not eaten for a while (fasting). You may have this done every 1-3 years.  Mammogram. This may be done every 1-2 years. Talk with your health care provider about how often you should have regular mammograms.  BRCA-related cancer screening. This may  be done if you have a family history of breast, ovarian, tubal, or peritoneal cancers. Other tests  Sexually transmitted disease (STD) testing.  Bone density scan. This is done to screen for osteoporosis. You may have this done starting at age 66. Follow these instructions at home: Eating and drinking  Eat a diet that includes fresh fruits and vegetables, whole grains, lean protein, and low-fat dairy products. Limit your intake of foods with high amounts of sugar, saturated fats, and salt.  Take vitamin and mineral supplements as recommended by your health care provider.  Do not drink alcohol if your health care provider tells you not to drink.  If you drink alcohol: ? Limit how much you have to 0-1 drink a day. ? Be aware of how much alcohol is in your drink. In the [place] one drink equals one 12 oz bottle of beer (355 mL), one 5 oz glass of wine (148 mL), or one 1 oz glass of hard liquor (44 mL). Lifestyle  Take daily care of your teeth and gums.  Stay active. Exercise for at least 30 minutes on 5 or more days each week.  Do not use any products that contain nicotine or tobacco, such as cigarettes, e-cigarettes, and chewing tobacco. If you need help quitting, ask your health care provider.  If you are sexually active, practice safe sex. Use a condom or other form of protection in order to prevent STIs (sexually transmitted infections).  Talk with your health care provider about taking a low-dose aspirin or statin. What's next?  Go to your health care provider once a year for a well check visit.  Ask your health care provider how often you should have your eyes and teeth checked.  Stay up to date on all vaccines. This information is not intended to replace advice given to you by your health care provider. Make sure you discuss any questions you have with your health care provider. Document Released: 03/15/2015 Document Revised: 02/10/2018 Document Reviewed: 02/10/2018 Elsevier  Patient Education  2020 [employer].

## 2018-09-15 NOTE — Patient Instructions (Signed)
Wendy Mosley , Thank you for taking time to come for your Medicare Wellness Visit. I appreciate your ongoing commitment to your health goals. Please review the following plan we discussed and let me know if I can assist you in the future.   Screening recommendations/referrals: Colonoscopy: 01/2016 Mammogram: referred Bone Density: has been ordered Recommended yearly ophthalmology/optometry visit for glaucoma screening and checkup Recommended yearly dental visit for hygiene and checkup  Vaccinations: Influenza vaccine: 03/2017 Pneumococcal vaccine: 03/2017 Tdap vaccine: 07/2011 Shingles vaccine: discussed    Advanced directives: Advance directive discussed with you today. Even though you declined this today please call our office should you change your mind and we can give you the proper paperwork for you to fill out.   Conditions/risks identified: obesity  Next appointment: 03/22/2018 at 2:00   Preventive Care 66 Years and Older, Female Preventive care refers to lifestyle choices and visits with your health care provider that can promote health and wellness. What does preventive care include?  A yearly physical exam. This is also called an annual well check.  Dental exams once or twice a year.  Routine eye exams. Ask your health care provider how often you should have your eyes checked.  Personal lifestyle choices, including:  Daily care of your teeth and gums.  Regular physical activity.  Eating a healthy diet.  Avoiding tobacco and drug use.  Limiting alcohol use.  Practicing safe sex.  Taking low-dose aspirin every day.  Taking vitamin and mineral supplements as recommended by your health care provider. What happens during an annual well check? The services and screenings done by your health care provider during your annual well check will depend on your age, overall health, lifestyle risk factors, and family history of disease. Counseling  Your health care  provider may ask you questions about your:  Alcohol use.  Tobacco use.  Drug use.  Emotional well-being.  Home and relationship well-being.  Sexual activity.  Eating habits.  History of falls.  Memory and ability to understand (cognition).  Work and work Statistician.  Reproductive health. Screening  You may have the following tests or measurements:  Height, weight, and BMI.  Blood pressure.  Lipid and cholesterol levels. These may be checked every 5 years, or more frequently if you are over 13 years old.  Skin check.  Lung cancer screening. You may have this screening every year starting at age 56 if you have a 30-pack-year history of smoking and currently smoke or have quit within the past 15 years.  Fecal occult blood test (FOBT) of the stool. You may have this test every year starting at age 64.  Flexible sigmoidoscopy or colonoscopy. You may have a sigmoidoscopy every 5 years or a colonoscopy every 10 years starting at age 73.  Hepatitis C blood test.  Hepatitis B blood test.  Sexually transmitted disease (STD) testing.  Diabetes screening. This is done by checking your blood sugar (glucose) after you have not eaten for a while (fasting). You may have this done every 1-3 years.  Bone density scan. This is done to screen for osteoporosis. You may have this done starting at age 76.  Mammogram. This may be done every 1-2 years. Talk to your health care provider about how often you should have regular mammograms. Talk with your health care provider about your test results, treatment options, and if necessary, the need for more tests. Vaccines  Your health care provider may recommend certain vaccines, such as:  Influenza vaccine. This is  recommended every year.  Tetanus, diphtheria, and acellular pertussis (Tdap, Td) vaccine. You may need a Td booster every 10 years.  Zoster vaccine. You may need this after age 61.  Pneumococcal 13-valent conjugate (PCV13)  vaccine. One dose is recommended after age 37.  Pneumococcal polysaccharide (PPSV23) vaccine. One dose is recommended after age 42. Talk to your health care provider about which screenings and vaccines you need and how often you need them. This information is not intended to replace advice given to you by your health care provider. Make sure you discuss any questions you have with your health care provider. Document Released: 03/15/2015 Document Revised: 11/06/2015 Document Reviewed: 12/18/2014 Elsevier Interactive Patient Education  2017 [place] Prevention in the Home Falls can cause injuries. They can happen to people of all ages. There are many things you can do to make your home safe and to help prevent falls. What can I do on the outside of my home?  Regularly fix the edges of walkways and driveways and fix any cracks.  Remove anything that might make you trip as you walk through a door, such as a raised step or threshold.  Trim any bushes or trees on the path to your home.  Use bright outdoor lighting.  Clear any walking paths of anything that might make someone trip, such as rocks or tools.  Regularly check to see if handrails are loose or broken. Make sure that both sides of any steps have handrails.  Any raised decks and porches should have guardrails on the edges.  Have any leaves, snow, or ice cleared regularly.  Use sand or salt on walking paths during winter.  Clean up any spills in your garage right away. This includes oil or grease spills. What can I do in the bathroom?  Use night lights.  Install grab bars by the toilet and in the tub and shower. Do not use towel bars as grab bars.  Use non-skid mats or decals in the tub or shower.  If you need to sit down in the shower, use a plastic, non-slip stool.  Keep the floor dry. Clean up any water that spills on the floor as soon as it happens.  Remove soap buildup in the tub or shower regularly.   Attach bath mats securely with double-sided non-slip rug tape.  Do not have throw rugs and other things on the floor that can make you trip. What can I do in the bedroom?  Use night lights.  Make sure that you have a light by your bed that is easy to reach.  Do not use any sheets or blankets that are too big for your bed. They should not hang down onto the floor.  Have a firm chair that has side arms. You can use this for support while you get dressed.  Do not have throw rugs and other things on the floor that can make you trip. What can I do in the kitchen?  Clean up any spills right away.  Avoid walking on wet floors.  Keep items that you use a lot in easy-to-reach places.  If you need to reach something above you, use a strong step stool that has a grab bar.  Keep electrical cords out of the way.  Do not use floor polish or wax that makes floors slippery. If you must use wax, use non-skid floor wax.  Do not have throw rugs and other things on the floor that can make you trip.  What can I do with my stairs?  Do not leave any items on the stairs.  Make sure that there are handrails on both sides of the stairs and use them. Fix handrails that are broken or loose. Make sure that handrails are as long as the stairways.  Check any carpeting to make sure that it is firmly attached to the stairs. Fix any carpet that is loose or worn.  Avoid having throw rugs at the top or bottom of the stairs. If you do have throw rugs, attach them to the floor with carpet tape.  Make sure that you have a light switch at the top of the stairs and the bottom of the stairs. If you do not have them, ask someone to add them for you. What else can I do to help prevent falls?  Wear shoes that:  Do not have high heels.  Have rubber bottoms.  Are comfortable and fit you well.  Are closed at the toe. Do not wear sandals.  If you use a stepladder:  Make sure that it is fully opened. Do not climb  a closed stepladder.  Make sure that both sides of the stepladder are locked into place.  Ask someone to hold it for you, if possible.  Clearly mark and make sure that you can see:  Any grab bars or handrails.  First and last steps.  Where the edge of each step is.  Use tools that help you move around (mobility aids) if they are needed. These include:  Canes.  Walkers.  Scooters.  Crutches.  Turn on the lights when you go into a dark area. Replace any light bulbs as soon as they burn out.  Set up your furniture so you have a clear path. Avoid moving your furniture around.  If any of your floors are uneven, fix them.  If there are any pets around you, be aware of where they are.  Review your medicines with your doctor. Some medicines can make you feel dizzy. This can increase your chance of falling. Ask your doctor what other things that you can do to help prevent falls. This information is not intended to replace advice given to you by your health care provider. Make sure you discuss any questions you have with your health care provider. Document Released: 12/13/2008 Document Revised: 07/25/2015 Document Reviewed: 03/23/2014 Elsevier Interactive Patient Education  2017 [employer].

## 2018-09-15 NOTE — Progress Notes (Signed)
Subjective:     Patient ID: Wendy Mosley , female    DOB: [birth year] , 68 y.o.   MRN: [id number]   Chief Complaint  Patient presents with  . Annual Exam    HPI Pt is here for Medicare wellness done by [facility] and annual physical. She has a gynecologist who does her pap.  Last one was 1 y ago. She has not been sexually active while dating fiance x 11 years, but is getting married this month. She has past hx of HTN and been intolerant to medications. She controls it with diet.    Past Medical History:  Diagnosis Date  . Cataracts, bilateral    right eye - just watching  . Diverticulitis   . GERD (gastroesophageal reflux disease) 2006  . Hearing loss    right ear - no hearing aid  . Heart attack ([place]) 2008  . Hypertension   . Irregular heart beat 1973  . SVD (spontaneous vaginal delivery)    x 3     Family History  Problem Relation Age of Onset  . Asthma Mother   . Heart disease Mother   . Stroke Mother   . Diabetes Sister   . Cancer Maternal Grandmother        Lukemia     Current Outpatient Medications:  .  aspirin 81 MG tablet, Take 81 mg by mouth every other day. , Disp: , Rfl:  .  B COMPLEX-ZINC PO, Take 1 tablet by mouth daily., Disp: , Rfl:  .  clobetasol cream (TEMOVATE) 0.05 %, Apply to affected area twice daily as needed, Disp: 30 g, Rfl: 0 .  ibuprofen (ADVIL,MOTRIN) 200 MG tablet, Take 400-600 mg by mouth every 6 (six) hours as needed for moderate pain., Disp: , Rfl:  .  Omega-3 Fatty Acids ([employer] FISH OIL) 1000 MG CAPS, 3 tabs daily (Patient taking differently: Take 1,000 mg by mouth 2 (two) times daily. 3 tabs daily), Disp: 90 capsule, Rfl: 11 .  omeprazole (PRILOSEC) 40 MG capsule, Take 1 capsule (40 mg total) by mouth daily. (Patient taking differently: Take 40 mg by mouth daily as needed (heartburn). ), Disp: 30 capsule, Rfl: 0 .  triamcinolone cream (KENALOG) 0.5 %, APPLY 1 APPLICATION TOPICALLY 2 (TWO) TIMES DAILY. APPLY TO RASH ON RT ARM AND SIDE,  Disp: 60 g, Rfl: 0   Allergies  Allergen Reactions  . Sulfonamide Derivatives     Rash/itching      Review of Systems  Gets intermittent constipation depending on what she eats sometimes, she has also been having intermittent R ear pain off and on and had it 2 days ago and took an allergy pill and resolved.Also has vaginal dryness. Has had some mild back stiffness. The rest is negative.   Today's Vitals   09/15/18 1151  BP: 130/78  Pulse: 87  Temp: 98.1 F (36.7 C)  TempSrc: Oral  SpO2: 97%  Weight: 177 lb 6.4 oz (80.5 kg)  Height: 5' 3.4" (1.61 m)   Body mass index is 31.03 kg/m.   Objective:  Physical Exam  BP 130/78 (BP Location: Left Arm, Patient Position: Sitting, Cuff Size: Normal)   Pulse 87   Temp 98.1 F (36.7 C) (Oral)   Ht 5' 3.4" (1.61 m)   Wt 177 lb 6.4 oz (80.5 kg)   SpO2 97%   BMI 31.03 kg/m   General Appearance:    Alert, cooperative, no distress, appears stated age  Head:    Normocephalic, without obvious  abnormality, atraumatic  Eyes:    PERRL, conjunctiva/corneas clear, EOM's intact,   Ears:    Normal L TM, R TM is dull and gray and external ear canals, both ears  Nose:   Nares normal, septum midline, mucosa normal, no drainage    or sinus tenderness  Throat:   Lips, mucosa, and tongue normal; teeth and gums normal  Neck:   Supple, symmetrical, trachea midline, no adenopathy;    thyroid:  no enlargement/tenderness/nodules; no carotid   bruit   Back:     Symmetric, no curvature, ROM normal, no CVA tenderness  Lungs:     Clear to auscultation bilaterally, respirations unlabored  Chest Wall:    No tenderness or deformity   Heart:    Regular rate and rhythm, S1 and S2 normal, no murmur, rub   or gallop  Breast Exam:    No tenderness, masses, or nipple abnormality  Abdomen:     Soft, non-tender, bowel sounds active all four quadrants,    no masses, no organomegaly  Genitalia:    Normal female without lesion, discharge or tenderness  Rectal:     Normal tone, normal prostate, no masses or tenderness;   guaiac negative stool  Extremities:   Extremities normal, atraumatic, no cyanosis or edema  Pulses:   2+ and symmetric all extremities  Skin:   Skin color, texture, turgor normal, no rashes or lesions  Lymph nodes:   Cervical, supraclavicular, and axillary nodes normal  Neurologic:   CNII-XII intact, normal strength, sensation and reflexes    Throughout. Normal tandem gait, heel and tip toe gait. Normal Romberg.   EKG - Normal sinus. WNL Assessment And Plan:   1. Essential hypertension- stable with diet control. FU 6 month. - EKG 12-Lead - CMP14 + Anion Gap - CBC no Diff  2. Screen for colon cancer- routine - POC Hemoccult Bld/Stl (1-Cd Office Dx)  3. Hyperlipidemia, unspecified hyperlipidemia type- chornic - Lipid Profile - TSH - T3, free - T4, Free  4. Encounter for general adult medical examination with abnormal findings- routine. Fu     1y  5. Acute allergic serous otitis media, right- resolving. FU prn.    Korine Winton RODRIGUEZ-SOUTHWORTH, PA-C    THE PATIENT IS ENCOURAGED TO PRACTICE SOCIAL DISTANCING DUE TO THE COVID-19 PANDEMIC.

## 2018-09-15 NOTE — Progress Notes (Signed)
Subjective:   Wendy Mosley is a 68 y.o. female who presents for Medicare Annual (Subsequent) preventive examination.  Review of Systems:  n/a Cardiac Risk Factors include: advanced age (>58men, >49 women);hypertension;obesity (BMI >30kg/m2);sedentary lifestyle     Objective:     Vitals: BP 130/78 (BP Location: Left Arm, Patient Position: Sitting, Cuff Size: Normal)   Pulse 87   Temp 98.1 F (36.7 C) (Oral)   Ht 5' 3.4" (1.61 m)   Wt 177 lb 6.4 oz (80.5 kg)   SpO2 97%   BMI 31.03 kg/m   Body mass index is 31.03 kg/m.  Advanced Directives 09/15/2018 05/27/2018 02/14/2018 12/14/2017 12/24/2016 09/08/2016 07/22/2016  Does Patient Have a Medical Advance Directive? No No No No No No No  Would patient like information on creating a medical advance directive? No - Patient declined Yes (MAU/Ambulatory/Procedural Areas - Information given) No - Patient declined - No - Patient declined - -    Tobacco Social History   Tobacco Use  Smoking Status Former Smoker  . Packs/day: 1.00  . Years: 30.00  . Pack years: 30.00  . Types: Cigarettes  . Quit date: 02/01/2011  . Years since quitting: 7.6  Smokeless Tobacco Never Used     Counseling given: Not Answered   Clinical Intake:  Pre-visit preparation completed: Yes  Pain : 0-10 Pain Score: 3  Pain Type: Chronic pain Pain Location: Groin Pain Orientation: Lower Pain Descriptors / Indicators: Cramping Pain Onset: More than a month ago Pain Frequency: Intermittent Pain Relieving Factors: ibuprofen  Pain Relieving Factors: ibuprofen  Nutritional Status: BMI > 30  Obese Nutritional Risks: None Diabetes: No  How often do you need to have someone help you when you read instructions, pamphlets, or other written materials from your doctor or pharmacy?: 1 - Never What is the last grade level you completed in school?: some college  Interpreter Needed?: No  Information entered by :: NAllen LPN  Past Medical History:   Diagnosis Date  . Cataracts, bilateral    right eye - just watching  . Diverticulitis   . GERD (gastroesophageal reflux disease) 2006  . Hearing loss    right ear - no hearing aid  . Heart attack ([place]) 2008  . Hypertension   . Irregular heart beat 1973  . SVD (spontaneous vaginal delivery)    x 3   Past Surgical History:  Procedure Laterality Date  . APPENDECTOMY  1967  . BREAST SURGERY     cyst removed from right breast at age 34  . CHOLECYSTECTOMY  1975  . COLONOSCOPY    . DILATATION & CURETTAGE/HYSTEROSCOPY WITH MYOSURE N/A 02/21/2018   Procedure: DILATATION & CURETTAGE/HYSTEROSCOPY WITH MYOSURE RESECTION OF ENDOMETRIAL POLYP;  Surgeon: Servando Salina, MD;  Location: [facility];  Service: Gynecology;  Laterality: N/A;  . EXTERNAL EAR SURGERY  1974  . TUBAL LIGATION  1977   Family History  Problem Relation Age of Onset  . Asthma Mother   . Heart disease Mother   . Stroke Mother   . Diabetes Sister   . Cancer Maternal Grandmother        Lukemia   Social History   Socioeconomic History  . Marital status: Single    Spouse name: Not on file  . Number of children: Not on file  . Years of education: Not on file  . Highest education level: Not on file  Occupational History  . Not on file  Social Needs  . Financial resource strain: Not hard  at all  . Food insecurity    Worry: Never true    Inability: Never true  . Transportation needs    Medical: No    Non-medical: No  Tobacco Use  . Smoking status: Former Smoker    Packs/day: 1.00    Years: 30.00    Pack years: 30.00    Types: Cigarettes    Quit date: 02/01/2011    Years since quitting: 7.6  . Smokeless tobacco: Never Used  Substance and Sexual Activity  . Alcohol use: Yes    Comment: occasional glass of wine  . Drug use: No    Comment: No IV drug  . Sexual activity: Not Currently    Birth control/protection: Post-menopausal  Lifestyle  . Physical activity    Days per week: 0 days    Minutes per  session: 0 min  . Stress: Not at all  Relationships  . Social Herbalist on phone: Not on file    Gets together: Not on file    Attends religious service: Not on file    Active member of club or organization: Not on file    Attends meetings of clubs or organizations: Not on file    Relationship status: Not on file  Other Topics Concern  . Not on file  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[place]   3 Angels   Legally Separated to Second Husband          Outpatient Encounter Medications as of 09/15/2018  Medication Sig  . aspirin 81 MG tablet Take 81 mg by mouth every other day.   . B COMPLEX-ZINC PO Take 1 tablet by mouth daily.  . clobetasol cream (TEMOVATE) 0.05 % Apply to affected area twice daily as needed  . ibuprofen (ADVIL,MOTRIN) 200 MG tablet Take 400-600 mg by mouth every 6 (six) hours as needed for moderate pain.  . Omega-3 Fatty Acids ([employer] FISH OIL) 1000 MG CAPS 3 tabs daily (Patient taking differently: Take 1,000 mg by mouth 2 (two) times daily. 3 tabs daily)  . triamcinolone cream (KENALOG) 0.5 % APPLY 1 APPLICATION TOPICALLY 2 (TWO) TIMES DAILY. APPLY TO RASH ON RT ARM AND SIDE  . omeprazole (PRILOSEC) 40 MG capsule Take 1 capsule (40 mg total) by mouth daily. (Patient taking differently: Take 40 mg by mouth daily as needed (heartburn). )   No facility-administered encounter medications on file as of 09/15/2018.     Activities of Daily Living In your present state of health, do you have any difficulty performing the following activities: 09/15/2018 02/21/2018  Hearing? Y -  Comment sometimes misunderstands -  Vision? Y -  Comment has blurriness -  Difficulty concentrating or making decisions? N -  Walking or climbing stairs? Y -  Comment if right knee hurts -  Dressing or bathing? N -  Doing errands, shopping? N N  Preparing Food and eating ? N -  Using the Toilet? N -  In the past six months, have you accidently  leaked urine? N -  Do you have problems with loss of bowel control? N -  Managing your Medications? N -  Managing your Finances? N -  Housekeeping or managing your Housekeeping? N -  Some recent data might be hidden    Patient Care Team: Glendale Chard, MD as PCP - General (Internal Medicine) Daneen Schick as Social Worker Little, Claudette Stapler, RN as Case Manager    Assessment:  This is a routine wellness examination for Analycia.  Exercise Activities and Dietary recommendations Current Exercise Habits: The patient does not participate in regular exercise at present  Goals    . "I have been exposed to COVID-19"     Current Barriers:  [name] Knowledge Deficits related to treatment management for COVID-19  Nurse Case Manager Clinical Goal(s):  [name] Over the next 30 days, patient will work with [facility] and provider [name], FNP to address needs related to COVID-19  CCM RN CM Interventions:  Completed on 07/29/18, call completed with paitent  . Evaluation of current treatment plan related to COVID-19 and patient's adherence to plan as established by provider.  . Discussed patient's JZPHX-50 results, pt reports per my chart results are NEGATIVE . Assessed for s/s suggestive of COVID-19, pt denies . Assessed for questions or concerns related to COVID-19, pt denies . Reinforced following social distancing, wearing a mask and using good handwashing to help avoid getting or spreading virus, discussed monitoring temperature at home to help identify early onset of the virus, discussed when to call the provider if needed, pt verbalizes understanding . Scheduled RNCM telephone follow-up call with patient for 2 weeks  Patient Self Care Activities:  . Self administers medications as prescribed . Attends all scheduled provider appointments . Calls provider office for new concerns or questions  Please see past updates related to this goal by clicking on the "Past Updates" button in the selected goal        . "I have pressure and intermittent pain in my lower abdomen" (pt-stated)     Current Barriers:  [name] Knowledge Deficits related to etiology for lower abdominal pain  Nurse Case Manager Clinical Goal(s):  [name] Over the next 60 days, patient will work with Designer, fashion/clothing  to address needs related to unresolved uterine pain  Interventions:   Completed CCM RN Telephone Follow up with patient . Evaluation of current treatment plan related to uterine pain and patient's adherence to plan as established by provider . Assessed for persistent or worsening lower abdominal pain/discomfort - pt states its about the same . Reinforced for patient to schedule a follow up visit with her established GYN to further evaluate unresolved uterine pain-pt plans to schedule after COVID-19 has resolved . Discussed plans with patient for ongoing care management follow up and provided patient with direct contact information for care management team . Scheduled a CCM follow up call with patient for 2-3 weeks  Patient Self Care Activities:  . Self administers medications as prescribed . Attends all scheduled provider appointments . Calls pharmacy for medication refills . Attends church or other social activities . Performs ADL's independently . Performs IADL's independently . Calls provider office for new concerns or questions  Please see past updates related to this goal by clicking on the "Past Updates" button in the selected goal       . "I know I need to start exercising again" (pt-stated)     Current Barriers:  [name] Knowledge Deficits related to health benefits associated with physical exercise  Nurse Case Manager Clinical Goal(s):  [name] Over the next 60 days, patient will verbalize having a plan for implementing a structured home exercise plan. . 07/18/18-Goal date revised to 60 days due to health related delays secondary to COVID-19  Interventions:   Completed CCM RN Telephone Follow Up with  patient  . Evaluation of current treatment plan related to home exercise plan and patient's adherence to plan as established by provider .  Reinforced to start off slowly and increase activity/exercise as tolerated - pt is walking outside her home with granddaughter occasionally "going down the driveway and back" . Reinforced health benefits from having a consistent routine home exercise regimen to help with cardio health and build stamina  . Mailed printed patient ed mats related to healthy eating and benefits of exercise . Discussed plans with patient for ongoing care management follow up and provided patient with direct contact information for care management team . Scheduled a follow up call with patient for about 2-3 weeks  Patient Self Care Activities:  . Self administers medications as prescribed . Attends all scheduled provider appointments . Calls pharmacy for medication refills . Attends church or other social activities . Performs ADL's independently . Performs IADL's independently . Calls provider office for new concerns or questions  Please see past updates related to this goal by clicking on the "Past Updates" button in the selected goal       . Identify patient Care Coordination and Disease Management Needs     Current Barriers:   Knowledge Deficits related to care coordination needs and chronic disease management needs  Nurse Case Manager Clinical Goal(s):   Over the next 90 days, patient will collaborate with care management team to address care coordination and chronic disease management needs related to Disease Management for medical comorbidities.   Interventions:   One on one collaboration establish plan of care to address care coordination and disease management needs with clinical colleague Daneen Schick BSW.   Patient Self Care Activities:   Currently UNABLE TO independently self manage needs related to chronic health conditions.   Initial goal  documentation        Fall Risk Fall Risk  09/15/2018 07/26/2018 06/07/2018 03/29/2017 09/08/2016  Falls in the past year? 0 0 0 No No  Risk for fall due to : Medication side effect - - - -  Follow up Falls evaluation completed;Education provided;Falls prevention discussed - - - -   Is the patient's home free of loose throw rugs in walkways, pet beds, electrical cords, etc?   yes      Grab bars in the bathroom? no      Handrails on the stairs?   n/a      Adequate lighting?   yes  Timed Get Up and Go performed: n/a  Depression Screen PHQ 2/9 Scores 09/15/2018 07/26/2018 05/27/2018 03/29/2017  PHQ - 2 Score 0 0 0 0  PHQ- 9 Score 0 - - 0     Cognitive Function     6CIT Screen 09/15/2018  What Year? 0 points  What month? 0 points  What time? 0 points  Count back from 20 2 points  Months in reverse 0 points  Repeat phrase 0 points  Total Score 2    Immunization History  Administered Date(s) Administered  . Influenza,inj,Quad PF,6+ Mos 11/12/2015, 03/29/2017  . Pneumococcal Conjugate-13 03/29/2017  . Tdap 07/02/2011    Qualifies for Shingles Vaccine? yes  Screening Tests Health Maintenance  Topic Date Due  . DEXA SCAN  07/16/2015  . MAMMOGRAM  03/17/2018  . PNA vac Low Risk Adult (2 of 2 - PPSV23) 03/29/2018  . INFLUENZA VACCINE  10/01/2018  . TETANUS/TDAP  07/01/2021  . COLONOSCOPY  02/26/2026  . Hepatitis C Screening  Completed    Cancer Screenings: Lung: Low Dose CT Chest recommended if Age 33-80 years, 30 pack-year currently smoking OR have quit w/in 15years. Patient does not qualify. Breast:  Up to date on Mammogram? No   Up to date of Bone Density/Dexa? No Colorectal: up to date  Additional Screenings: : Hepatitis C Screening: 11/12/2015     Plan:    6 CIT was 2. Declines Pneumonia vaccine at this time. Referred for mammogram.   I have personally reviewed and noted the following in the patient's chart:   . Medical and social history . Use of alcohol,  tobacco or illicit drugs  . Current medications and supplements . Functional ability and status . Nutritional status . Physical activity . Advanced directives . List of other physicians . Hospitalizations, surgeries, and ER visits in previous 12 months . Vitals . Screenings to include cognitive, depression, and falls . Referrals and appointments  In addition, I have reviewed and discussed with patient certain preventive protocols, quality metrics, and best practice recommendations. A written personalized care plan for preventive services as well as general preventive health recommendations were provided to patient.     Kellie Simmering, LPN  [id number]

## 2018-09-16 LAB — CMP14 + ANION GAP
ALT: 21 IU/L (ref 0–32)
AST: 21 IU/L (ref 0–40)
Albumin/Globulin Ratio: 1.5 (ref 1.2–2.2)
Albumin: 4.5 g/dL (ref 3.8–4.8)
Alkaline Phosphatase: 137 IU/L — ABNORMAL HIGH (ref 39–117)
Anion Gap: 19 mmol/L — ABNORMAL HIGH (ref 10.0–18.0)
BUN/Creatinine Ratio: 10 — ABNORMAL LOW (ref 12–28)
BUN: 9 mg/dL (ref 8–27)
Bilirubin Total: 0.4 mg/dL (ref 0.0–1.2)
CO2: 24 mmol/L (ref 20–29)
Calcium: 10 mg/dL (ref 8.7–10.3)
Chloride: 99 mmol/L (ref 96–106)
Creatinine, Ser: 0.87 mg/dL (ref 0.57–1.00)
GFR calc Af Amer: 79 mL/min/{1.73_m2} (ref >59)
GFR calc non Af Amer: 69 mL/min/{1.73_m2} (ref >59)
Globulin, Total: 3.1 g/dL (ref 1.5–4.5)
Glucose: 91 mg/dL (ref 65–99)
Potassium: 4.3 mmol/L (ref 3.5–5.2)
Sodium: 142 mmol/L (ref 134–144)
Total Protein: 7.6 g/dL (ref 6.0–8.5)

## 2018-09-16 LAB — LIPID PANEL
Chol/HDL Ratio: 5.6 ratio — ABNORMAL HIGH (ref 0.0–4.4)
Cholesterol, Total: 248 mg/dL — ABNORMAL HIGH (ref 100–199)
HDL: 44 mg/dL (ref >39)
LDL Calculated: 169 mg/dL — ABNORMAL HIGH (ref 0–99)
Triglycerides: 176 mg/dL — ABNORMAL HIGH (ref 0–149)
VLDL Cholesterol Cal: 35 mg/dL (ref 5–40)

## 2018-09-16 LAB — CBC
Hematocrit: 39.6 % (ref 34.0–46.6)
Hemoglobin: 12.7 g/dL (ref 11.1–15.9)
MCH: 23.2 pg — ABNORMAL LOW (ref 26.6–33.0)
MCHC: 32.1 g/dL (ref 31.5–35.7)
MCV: 72 fL — ABNORMAL LOW (ref 79–97)
Platelets: 330 10*3/uL (ref 150–450)
RBC: 5.47 x10E6/uL — ABNORMAL HIGH (ref 3.77–5.28)
RDW: 16.3 % — ABNORMAL HIGH (ref 11.7–15.4)
WBC: 5.9 10*3/uL (ref 3.4–10.8)

## 2018-09-16 LAB — T3, FREE: T3, Free: 2.7 pg/mL (ref 2.0–4.4)

## 2018-09-16 LAB — TSH: TSH: 2.28 u[IU]/mL (ref 0.450–4.500)

## 2018-09-16 LAB — T4, FREE: Free T4: 1.21 ng/dL (ref 0.82–1.77)

## 2018-09-22 ENCOUNTER — Other Ambulatory Visit: Payer: Self-pay | Admitting: Internal Medicine

## 2018-09-22 MED ORDER — ATORVASTATIN CALCIUM 10 MG PO TABS: 10.0000 mg | ORAL_TABLET | Freq: Every day | ORAL | 0 refills | Status: DC

## 2018-09-30 ENCOUNTER — Telehealth: Payer: Self-pay

## 2018-09-30 NOTE — Telephone Encounter (Signed)
The pt was notified that her [place] and wellness DOT form has been completed by Audery Amel, PA and has been faxed and is ready for pickup.

## 2018-10-12 ENCOUNTER — Telehealth: Payer: Self-pay

## 2018-10-12 ENCOUNTER — Other Ambulatory Visit: Payer: Self-pay

## 2018-10-12 ENCOUNTER — Ambulatory Visit (INDEPENDENT_AMBULATORY_CARE_PROVIDER_SITE_OTHER): Payer: Medicare HMO

## 2018-10-12 DIAGNOSIS — I10 Essential (primary) hypertension: Secondary | ICD-10-CM | POA: Diagnosis not present

## 2018-10-12 DIAGNOSIS — K219 Gastro-esophageal reflux disease without esophagitis: Secondary | ICD-10-CM

## 2018-10-12 DIAGNOSIS — E785 Hyperlipidemia, unspecified: Secondary | ICD-10-CM | POA: Diagnosis not present

## 2018-10-14 NOTE — Chronic Care Management (AMB) (Signed)
Chronic Care Management   Follow Up Note   10/12/2018 Name: Wendy Mosley MRN: [id number] DOB: 08/16/1950  Referred by: Rodriguez-Southworth, Sylvia, PA-C Reason for referral : Chronic Care Management (CCM RNCM Telephone Follow up)   Wendy Mosley is a 68 y.o. year old female who is a primary care patient of Rodriguez-Southworth, Sylvia, PA-C. The CCM team was consulted for assistance with chronic disease management and care coordination needs.    Review of patient status, including review of consultants reports, relevant laboratory and other test results, and collaboration with appropriate care team members and the patient's provider was performed as part of comprehensive patient evaluation and provision of chronic care management services.    Outpatient Encounter Medications as of 10/12/2018  Medication Sig  . aspirin 81 MG tablet Take 81 mg by mouth every other day.   . atorvastatin (LIPITOR) 10 MG tablet Take 1 tablet (10 mg total) by mouth daily.  . B COMPLEX-ZINC PO Take 1 tablet by mouth daily.  . clobetasol cream (TEMOVATE) 0.05 % Apply to affected area twice daily as needed  . ibuprofen (ADVIL,MOTRIN) 200 MG tablet Take 400-600 mg by mouth every 6 (six) hours as needed for moderate pain.  . triamcinolone cream (KENALOG) 0.5 % APPLY 1 APPLICATION TOPICALLY 2 (TWO) TIMES DAILY. APPLY TO RASH ON RT ARM AND SIDE   No facility-administered encounter medications on file as of 10/12/2018.     I spoke with Mrs. Mosley by telephone today for a CCM follow up.   Goals Addressed      Patient Stated   . "I don't want to take a prescription drug for my cholesterol" (pt-stated)       Current Barriers:  . Knowledge Deficits related to treatment management for Hyperlipidemia  . Fear of taking prescription drugs to treat hyperlipidemia  Nurse Case Manager Clinical Goal(s):  . Over the next 60 days, patient will work with the CCM team to address needs related to  Hyperlipidemia  CCM RN CM Interventions:  10/12/18 completed call with patient  . Evaluation of current treatment plan related to Hyperlipidemia and patient's adherence to plan as established by provider. . Provided education to patient re: importance of lowering Cholesterol to help reduce the risk of heart attack and stroke . Reviewed medications with patient and discussed that patient has experienced SE to recent statin prescribed as well as past medications tried for this condition; discussed patients wishes to treat this condition with natural remedies, exercise and diet; discussed having the embedded Pharm D Julie Pruitt contact her to discuss best treatment options with least SE- pt agreed  . Collaborated with embedded Pharm D Julie Pruitt regarding outreach to patient to discuss best treatment options for treatment management of Hyperlipidemia with least SE . Discussed plans with patient for ongoing care management follow up and provided patient with direct contact information for care management team . Provided patient with printed educational materials related to disease process and treatment options for Hyperlipidemia  Patient Self Care Activities:  . Self administers medications as prescribed . Attends all scheduled provider appointments . Calls pharmacy for medication refills . Attends church or other social activities . Performs ADL's independently . Performs IADL's independently . Calls provider office for new concerns or questions  Initial goal documentation     . "I have pressure and intermittent pain in my lower abdomen" (pt-stated)       Current Barriers:  . Knowledge Deficits related to etiology for lower abdominal pain    Nurse Case Manager Clinical Goal(s):  . Over the next 60 days, patient will work with established Gynecologist  to address needs related to unresolved uterine pain  -Goal Not Met . 10/14/18 Over the next 90 days, patient will follow up with her  Gynecologist to address needs related to unresolved uterine pain  CCM RN CM Interventions:  10/14/18 call completed with patient   . Evaluation of current treatment plan related to uterine pain and patient's adherence to plan as established by provider . Assessed for persistent or worsening lower abdominal pain/discomfort - pt states its about the same . Reinforced for patient to schedule a follow up visit with her established GYN to further evaluate unresolved uterine pain-pt plans to schedule after COVID-19 has resolved . Discussed plans with patient for ongoing care management follow up and provided patient with direct contact information for care management team  Patient Self Care Activities:  . Self administers medications as prescribed . Attends all scheduled provider appointments . Calls pharmacy for medication refills . Attends church or other social activities . Performs ADL's independently . Performs IADL's independently . Calls provider office for new concerns or questions  Please see past updates related to this goal by clicking on the "Past Updates" button in the selected goal        Other   . COMPLETED: "I have been exposed to COVID-19"       Current Barriers:  . Knowledge Deficits related to treatment management for COVID-19  Nurse Case Manager Clinical Goal(s):  . Over the next 30 days, patient will work with RNCM and provider [name] Moore, FNP to address needs related to COVID-19  - Goal Met  CCM RN CM Interventions:  10/14/18 completed call with patient   . No further symptoms reported, problem resolved   Patient Self Care Activities:  . Self administers medications as prescribed . Attends all scheduled provider appointments . Calls provider office for new concerns or questions  Please see past updates related to this goal by clicking on the "Past Updates" button in the selected goal      . COMPLETED: Identify patient Care Coordination and Disease  Management Needs       Current Barriers:   Knowledge Deficits related to care coordination needs and chronic disease management needs  Nurse Case Manager Clinical Goal(s):   Over the next 90 days, patient will collaborate with care management team to address care coordination and chronic disease management needs related to Disease Management for medical comorbidities. Goal Met    Interventions:   One on one collaboration establish plan of care to address care coordination and disease management needs with clinical colleague Kendra Humble BSW. See other CCM RNCM goals established    Patient Self Care Activities:   Currently UNABLE TO independently self manage needs related to chronic health conditions.   Initial goal documentation         Telephone follow up appointment with care management team member scheduled for: 11/10/18   Angel Little, RN, BSN, CCM Care Management Coordinator [facility]  Direct Phone: [phone]    

## 2018-10-14 NOTE — Patient Instructions (Signed)
Visit Information  Goals Addressed      Patient Stated   . "I don't want to take a prescription drug for my cholesterol" (pt-stated)       Current Barriers:  [name] Knowledge Deficits related to treatment management for Hyperlipidemia  . Fear of taking prescription drugs to treat hyperlipidemia  Nurse Case Manager Clinical Goal(s):  [name] Over the next 60 days, patient will work with the CCM team to address needs related to Hyperlipidemia  CCM RN CM Interventions:  10/12/18 call completed with patient  . Evaluation of current treatment plan related to Hyperlipidemia and patient's adherence to plan as established by provider. . Provided education to patient re: importance of lowering Cholesterol to help reduce the risk of heart attack and stroke . Reviewed medications with patient and discussed that patient has experienced SE to recent statin prescribed as well as past medications tried for this condition; discussed patients wishes to treat this condition with natural remedies, exercise and diet; discussed having the embedded Pharm D Lottie Dawson contact her to discuss best treatment options with least SE- pt agreed  . Collaborated with embedded Pharm D Lottie Dawson regarding outreach to patient to discuss best treatment options for treatment management of Hyperlipidemia with least SE . Discussed plans with patient for ongoing care management follow up and provided patient with direct contact information for care management team . Provided patient with printed educational materials related to disease process and treatment options for Hyperlipidemia  Patient Self Care Activities:  . Self administers medications as prescribed . Attends all scheduled provider appointments . Calls pharmacy for medication refills . Attends church or other social activities . Performs ADL's independently . Performs IADL's independently . Calls provider office for new concerns or questions  Initial goal  documentation     . "I have pressure and intermittent pain in my lower abdomen" (pt-stated)       Current Barriers:  [name] Knowledge Deficits related to etiology for lower abdominal pain  Nurse Case Manager Clinical Goal(s):  [name] Over the next 60 days, patient will work with established Gynecologist  to address needs related to unresolved uterine pain  -Goal Not Met . 10/14/18 Over the next 90 days, patient will follow up with her Gynecologist to address needs related to unresolved uterine pain  CCM RN CM Interventions:  10/14/18 call completed with patient   . Evaluation of current treatment plan related to uterine pain and patient's adherence to plan as established by provider . Assessed for persistent or worsening lower abdominal pain/discomfort - pt states its about the same . Reinforced for patient to schedule a follow up visit with her established GYN to further evaluate unresolved uterine pain-pt plans to schedule after COVID-19 has resolved . Discussed plans with patient for ongoing care management follow up and provided patient with direct contact information for care management team  Patient Self Care Activities:  . Self administers medications as prescribed . Attends all scheduled provider appointments . Calls pharmacy for medication refills . Attends church or other social activities . Performs ADL's independently . Performs IADL's independently . Calls provider office for new concerns or questions  Please see past updates related to this goal by clicking on the "Past Updates" button in the selected goal         Other   . COMPLETED: "I have been exposed to COVID-19"       Current Barriers:  [name] Knowledge Deficits related to treatment management for COVID-19  Nurse Case  Manager Clinical Goal(s):  [name] Over the next 30 days, patient will work with [facility] and provider [name], FNP to address needs related to COVID-19  - Goal Met  CCM RN CM Interventions:  10/14/18 completed  call with patient   . No further symptoms reported, problem resolved   Patient Self Care Activities:  . Self administers medications as prescribed . Attends all scheduled provider appointments . Calls provider office for new concerns or questions  Please see past updates related to this goal by clicking on the "Past Updates" button in the selected goal       . COMPLETED: Identify patient Care Coordination and Disease Management Needs       Current Barriers:   Knowledge Deficits related to care coordination needs and chronic disease management needs  Nurse Case Manager Clinical Goal(s):   Over the next 90 days, patient will collaborate with care management team to address care coordination and chronic disease management needs related to Disease Management for medical comorbidities. Goal Met    Interventions:   One on one collaboration establish plan of care to address care coordination and disease management needs with clinical colleague Daneen Schick BSW.   Patient Self Care Activities:   Currently UNABLE TO independently self manage needs related to chronic health conditions.   Initial goal documentation        The patient verbalized understanding of instructions provided today and declined a print copy of patient instruction materials.   Telephone follow up appointment with care management team member scheduled for: 11/10/18  Barb Merino, RN, BSN, CCM Care Management Coordinator [place] [facility]  Direct Phone: [phone]

## 2018-10-26 ENCOUNTER — Other Ambulatory Visit: Payer: Self-pay | Admitting: Internal Medicine

## 2018-11-04 ENCOUNTER — Ambulatory Visit: Payer: Medicare HMO

## 2018-11-10 ENCOUNTER — Telehealth: Payer: Self-pay

## 2018-11-24 ENCOUNTER — Telehealth: Payer: Self-pay

## 2018-12-16 ENCOUNTER — Other Ambulatory Visit: Payer: Self-pay

## 2018-12-16 ENCOUNTER — Ambulatory Visit
Admission: RE | Admit: 2018-12-16 | Discharge: 2018-12-16 | Disposition: A | Discharge status: Home / Self Care | Payer: Medicare HMO | Source: Ambulatory Visit | Attending: Internal Medicine | Admitting: Internal Medicine

## 2018-12-16 DIAGNOSIS — Z1231 Encounter for screening mammogram for malignant neoplasm of breast: Secondary | ICD-10-CM | POA: Diagnosis not present

## 2018-12-16 DIAGNOSIS — Z1239 Encounter for other screening for malignant neoplasm of breast: Secondary | ICD-10-CM

## 2018-12-20 ENCOUNTER — Other Ambulatory Visit: Payer: Self-pay | Admitting: Internal Medicine

## 2018-12-23 ENCOUNTER — Telehealth: Payer: Self-pay

## 2019-01-24 ENCOUNTER — Ambulatory Visit: Payer: Self-pay

## 2019-01-25 ENCOUNTER — Telehealth: Payer: Self-pay

## 2019-01-25 NOTE — Telephone Encounter (Signed)
The pt needed an appt for a flu vaccination and said that she is leaving to go to [place] back to work on Sunday.  The pt was told that the office is now closed and will reopen on Monday.  The pt was told to check with [facility] or [facility] for her flu vaccination and to make sure they send the information to the office so her chart can be updated.

## 2019-02-01 ENCOUNTER — Telehealth: Payer: Self-pay

## 2019-03-16 ENCOUNTER — Telehealth: Payer: Self-pay

## 2019-03-21 ENCOUNTER — Ambulatory Visit: Payer: Self-pay

## 2019-03-21 DIAGNOSIS — E785 Hyperlipidemia, unspecified: Secondary | ICD-10-CM

## 2019-03-21 DIAGNOSIS — I10 Essential (primary) hypertension: Secondary | ICD-10-CM

## 2019-03-21 NOTE — Chronic Care Management (AMB) (Signed)
  Chronic Care Management   Outreach Note  03/21/2019 Name: Wendy Mosley MRN: [id number] DOB: [birth year]  Referred by: Shelby Mattocks, PA-C Reason for referral : Care Coordination   SW placed an outbound call to the patient to assist with care coordination needs. Upon introducing self to the patient, the patient stated she was unable to complete today's call due to being en route to the hospital with her daughter. SW discussed scheduling a call back time. The patient stated "I will call you back at a later time"  Follow Up Plan: A member of the care management team will outreach the patient over the next month.  Daneen Schick, BSW, CDP Social Worker, Certified Dementia Practitioner [place] [facility] [place] [facility] [phone]

## 2019-03-23 ENCOUNTER — Ambulatory Visit: Payer: Medicare HMO | Admitting: Internal Medicine

## 2019-03-23 ENCOUNTER — Ambulatory Visit: Payer: Medicare HMO

## 2019-03-28 ENCOUNTER — Encounter: Payer: Self-pay | Admitting: Nurse Practitioner

## 2019-03-28 ENCOUNTER — Other Ambulatory Visit: Payer: Self-pay

## 2019-03-28 ENCOUNTER — Ambulatory Visit (INDEPENDENT_AMBULATORY_CARE_PROVIDER_SITE_OTHER): Payer: Medicare HMO | Admitting: Nurse Practitioner

## 2019-03-28 VITALS — BP 140/82 | HR 76 | Temp 98.3°F | Ht 63.4 in | Wt 176.0 lb

## 2019-03-28 DIAGNOSIS — I10 Essential (primary) hypertension: Secondary | ICD-10-CM | POA: Diagnosis not present

## 2019-03-28 DIAGNOSIS — E785 Hyperlipidemia, unspecified: Secondary | ICD-10-CM | POA: Diagnosis not present

## 2019-03-28 NOTE — Progress Notes (Signed)
This visit occurred during the SARS-CoV-2 public health emergency.  Safety protocols were in place, including screening questions prior to the visit, additional usage of staff PPE, and extensive cleaning of exam room while observing appropriate contact time as indicated for disinfecting solutions.  Subjective:     Patient ID: Wendy Mosley , female    DOB: [birth year] , 69 y.o.   MRN: [id number]   Chief Complaint  Patient presents with  . Hypertension  . Hyperlipidemia    HPI  She is not taking her cholesterol medications due to it making her feel sick and itching. She stopped taking atorvastatin due to researching it can "cause   Hypertension Pertinent negatives include no headaches.     Past Medical History:  Diagnosis Date  . Cataracts, bilateral    right eye - just watching  . Diverticulitis   . GERD (gastroesophageal reflux disease) 2006  . Hearing loss    right ear - no hearing aid  . Heart attack ([place]) 2008  . Hypertension   . Irregular heart beat 1973  . SVD (spontaneous vaginal delivery)    x 3     Family History  Problem Relation Age of Onset  . Asthma Mother   . Heart disease Mother   . Stroke Mother   . Diabetes Sister   . Cancer Maternal Grandmother        Lukemia  . Breast cancer Neg Hx      Current Outpatient Medications:  .  aspirin 81 MG tablet, Take 81 mg by mouth every other day. , Disp: , Rfl:  .  B COMPLEX-ZINC PO, Take 1 tablet by mouth daily., Disp: , Rfl:  .  clobetasol cream (TEMOVATE) 0.05 %, APPLY TO AFFECTED AREA TWICE A DAY AS NEEDED, Disp: 30 g, Rfl: 0 .  ibuprofen (ADVIL,MOTRIN) 200 MG tablet, Take 400-600 mg by mouth every 6 (six) hours as needed for moderate pain., Disp: , Rfl:  .  triamcinolone cream (KENALOG) 0.5 %, APPLY 1 APPLICATION TOPICALLY 2 (TWO) TIMES DAILY. APPLY TO RASH ON RT ARM AND SIDE, Disp: 60 g, Rfl: 0 .  atorvastatin (LIPITOR) 10 MG tablet, TAKE 1 TABLET BY MOUTH EVERY DAY (Patient not taking: Reported on  03/28/2019), Disp: 90 tablet, Rfl: 0   Allergies  Allergen Reactions  . Sulfonamide Derivatives     Rash/itching      Review of Systems  Constitutional: Negative.   Gastrointestinal: Negative.  Negative for nausea.  Endocrine: Negative for polydipsia, polyphagia and polyuria.  Skin: Negative.   Neurological: Negative for dizziness and headaches.  Psychiatric/Behavioral: Negative.      Today's Vitals   03/28/19 1214  BP: 140/82  Pulse: 76  Temp: 98.3 F (36.8 C)  TempSrc: Oral  Weight: 176 lb (79.8 kg)  Height: 5' 3.4" (1.61 m)  PainSc: 0-No pain   Body mass index is 30.78 kg/m.   Objective:  Physical Exam Constitutional:      Appearance: Normal appearance.  Cardiovascular:     Rate and Rhythm: Normal rate and regular rhythm.     Pulses: Normal pulses.     Heart sounds: Normal heart sounds. No murmur.  Pulmonary:     Effort: Pulmonary effort is normal. No respiratory distress.     Breath sounds: Normal breath sounds.  Neurological:     General: No focal deficit present.     Mental Status: She is alert and oriented to person, place, and time.  Psychiatric:  Mood and Affect: Mood normal.        Behavior: Behavior normal.        Thought Content: Thought content normal.        Judgment: Judgment normal.         Assessment And Plan:     1. Essential hypertension  Chronic, fair control   Not currently on any medications reports has taken several before with side effects  She is to call back to the office with the name of her previous provider to obtain records  2. Hyperlipidemia, unspecified hyperlipidemia type  Elevated at last visit  She is not taking atorvastatin due to side effects  Discussed with her to make sure to make [place] aware if having problems with her medications because we can offer options   Will check lipid panel today  Also discussed the pathophysiology of hyperlipidemia and the risk for heart disease   [name], FNP     THE PATIENT IS ENCOURAGED TO PRACTICE SOCIAL DISTANCING DUE TO THE COVID-19 PANDEMIC.

## 2019-04-06 ENCOUNTER — Ambulatory Visit: Payer: Self-pay

## 2019-04-06 DIAGNOSIS — E785 Hyperlipidemia, unspecified: Secondary | ICD-10-CM

## 2019-04-07 NOTE — Chronic Care Management (AMB) (Signed)
Chronic Care Management    Social Work Follow Up Note  04/07/2019 Name: Wendy Mosley MRN: [id number] DOB: 03/17/1950  Wendy Mosley is a 69 y.o. year old female who is a primary care patient of [name], [place]. The CCM team was consulted for assistance with care coordination.   Review of patient status, including review of consultants reports, other relevant assessments, and collaboration with appropriate care team members and the patient's provider was performed as part of comprehensive patient evaluation and provision of chronic care management services.    SW placed an outbound call to the patient to assist with care coordination.   Outpatient Encounter Medications as of 04/06/2019  Medication Sig  . aspirin 81 MG tablet Take 81 mg by mouth every other day.   [name] atorvastatin (LIPITOR) 10 MG tablet TAKE 1 TABLET BY MOUTH EVERY DAY (Patient not taking: Reported on 03/28/2019)  . B COMPLEX-ZINC PO Take 1 tablet by mouth daily.  . clobetasol cream (TEMOVATE) 0.05 % APPLY TO AFFECTED AREA TWICE A DAY AS NEEDED  . ibuprofen (ADVIL,MOTRIN) 200 MG tablet Take 400-600 mg by mouth every 6 (six) hours as needed for moderate pain.  [name] triamcinolone cream (KENALOG) 0.5 % APPLY 1 APPLICATION TOPICALLY 2 (TWO) TIMES DAILY. APPLY TO RASH ON RT ARM AND SIDE   No facility-administered encounter medications on file as of 04/06/2019.     Goals Addressed            This Visit's Progress     Patient Stated   . "I don't want to take a prescription drug for my cholesterol" (pt-stated)       Current Barriers:  [name] Knowledge Deficits related to treatment management for Hyperlipidemia  . Fear of taking prescription drugs to treat hyperlipidemia  Nurse Case Manager Clinical Goal(s):  [name] Over the next 60 days, patient will work with the CCM team to address needs related to Hyperlipidemia  CCM SW Interventions: Completed 04/06/19 . Outbound call to the patient to assist with care coordination needs .  Performed chart review to note patient seen by [name], FNP on 03/28/19 o During recent OV patient self reported decision to stop Atorvastatin due to side effects and itching o Assessed for patient plan to try a different medication. Patient reported she does not desire to take medication if possible due to possible side effects . Discussed patient plans to self manager Hyperlipidemia o Patient reports increased intake of fruit, vegetables, poultry, and fish o Patient reports decreasing intake of pork and beef o Patient states she has switched from white bread to wheat bread . Encouraged the patient to continue managing cholesterol with diet and to contact the care management team with future care coordination needs . Collaboration with RN Case Manager regarding patient self health management of Hyperlipidemia  Patient Self Care Activities:  . Self administers medications as prescribed . Attends all scheduled provider appointments . Calls pharmacy for medication refills . Attends church or other social activities . Performs ADL's independently . Performs IADL's independently . Calls provider office for new concerns or questions  Please see past updates related to this goal by clicking on the "Past Updates" button in the selected goal          Follow Up Plan: A member of the care management team will follow up with the patient over the next 60 days.   Daneen Schick, BSW, CDP Social Worker, Certified Dementia Practitioner [place] [facility] [place] [facility] [phone]  Total time  spent performing care coordination and/or care management activities with the patient by phone or face to face = 10 minutes.

## 2019-04-07 NOTE — Patient Instructions (Signed)
Social Worker Visit Information  Goals we discussed today:  Goals Addressed            This Visit's Progress     Patient Stated   . "I don't want to take a prescription drug for my cholesterol" (pt-stated)       Current Barriers:  [name] Knowledge Deficits related to treatment management for Hyperlipidemia  . Fear of taking prescription drugs to treat hyperlipidemia  Nurse Case Manager Clinical Goal(s):  [name] Over the next 60 days, patient will work with the CCM team to address needs related to Hyperlipidemia  CCM SW Interventions: Completed 04/06/19 . Outbound call to the patient to assist with care coordination needs . Performed chart review to note patient seen by [name], FNP on 03/28/19 o During recent OV patient self reported decision to stop Atorvastatin due to side effects and itching o Assessed for patient plan to try a different medication. Patient reported she does not desire to take medication if possible due to possible side effects . Discussed patient plans to self manager Hyperlipidemia o Patient reports increased intake of fruit, vegetables, poultry, and fish o Patient reports decreasing intake of pork and beef o Patient states she has switched from white bread to wheat bread . Encouraged the patient to continue managing cholesterol with diet and to contact the care management team with future care coordination needs . Collaboration with RN Case Manager regarding patient self health management of Hyperlipidemia  Patient Self Care Activities:  . Self administers medications as prescribed . Attends all scheduled provider appointments . Calls pharmacy for medication refills . Attends church or other social activities . Performs ADL's independently . Performs IADL's independently . Calls provider office for new concerns or questions  Please see past updates related to this goal by clicking on the "Past Updates" button in the selected goal         Follow Up Plan: A  member of the care management team will follow up with the patient over the next 60 days.  Daneen Schick, BSW, CDP Social Worker, Certified Dementia Practitioner [place] [facility] [place] [facility] [phone]

## 2019-04-19 ENCOUNTER — Other Ambulatory Visit: Payer: Self-pay

## 2019-04-19 ENCOUNTER — Other Ambulatory Visit (HOSPITAL_COMMUNITY)
Admission: RE | Admit: 2019-04-19 | Discharge: 2019-04-19 | Disposition: A | Discharge status: Home / Self Care | Payer: Medicare HMO | Source: Ambulatory Visit | Attending: Nurse Practitioner | Admitting: Nurse Practitioner

## 2019-04-19 ENCOUNTER — Encounter: Payer: Self-pay | Admitting: Nurse Practitioner

## 2019-04-19 ENCOUNTER — Ambulatory Visit (INDEPENDENT_AMBULATORY_CARE_PROVIDER_SITE_OTHER): Payer: Medicare HMO | Admitting: Nurse Practitioner

## 2019-04-19 VITALS — BP 138/86 | HR 84 | Temp 98.6°F | Ht 63.4 in | Wt 173.6 lb

## 2019-04-19 DIAGNOSIS — G8929 Other chronic pain: Secondary | ICD-10-CM

## 2019-04-19 DIAGNOSIS — M25512 Pain in left shoulder: Secondary | ICD-10-CM | POA: Diagnosis not present

## 2019-04-19 DIAGNOSIS — N760 Acute vaginitis: Secondary | ICD-10-CM

## 2019-04-19 DIAGNOSIS — L309 Dermatitis, unspecified: Secondary | ICD-10-CM | POA: Diagnosis not present

## 2019-04-19 DIAGNOSIS — M25511 Pain in right shoulder: Secondary | ICD-10-CM

## 2019-04-19 DIAGNOSIS — E785 Hyperlipidemia, unspecified: Secondary | ICD-10-CM | POA: Diagnosis not present

## 2019-04-19 DIAGNOSIS — I10 Essential (primary) hypertension: Secondary | ICD-10-CM | POA: Diagnosis not present

## 2019-04-19 MED ORDER — FLUCONAZOLE 100 MG PO TABS: 100.0000 mg | ORAL_TABLET | Freq: Every day | ORAL | 0 refills | Status: DC

## 2019-04-19 MED ORDER — TRIAMCINOLONE ACETONIDE 0.5 % EX CREA: 1.0000 "application " | TOPICAL_CREAM | Freq: Two times a day (BID) | CUTANEOUS | 0 refills | Status: DC

## 2019-04-19 MED ORDER — CLOBETASOL PROPIONATE 0.05 % EX CREA: TOPICAL_CREAM | CUTANEOUS | 0 refills | Status: DC

## 2019-04-19 MED ORDER — PREDNISONE 10 MG (21) PO TBPK: ORAL_TABLET | ORAL | 0 refills | Status: DC

## 2019-04-19 NOTE — Progress Notes (Signed)
This visit occurred during the SARS-CoV-2 public health emergency.  Safety protocols were in place, including screening questions prior to the visit, additional usage of staff PPE, and extensive cleaning of exam room while observing appropriate contact time as indicated for disinfecting solutions.  Subjective:     Patient ID: Wendy Mosley , female    DOB: 02/03/1951 , 68 y.o.   MRN: [id number]   Chief Complaint  Patient presents with  . Hypertension    shoulder pain  . 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  She will use the gel and tylenol with relief at times.     Vaginal Itching The patient's primary symptoms include genital itching. This is a new problem. The current episode started more than 1 month ago. The problem occurs constantly. The patient is experiencing no pain. Pertinent negatives include no abdominal pain, chills, diarrhea, fever or headaches. Nothing aggravates the symptoms. She has tried nothing for the symptoms. There is no history of an abdominal surgery.  Shoulder Pain  The pain is present in the right shoulder and left shoulder. This is a chronic problem. The current episode started more than 1 year ago. The problem occurs constantly. The problem has been unchanged. The quality of the pain is described as aching. Pertinent negatives include no fever. She has tried OTC pain meds for the symptoms. There is no history of diabetes.     Past Medical History:  Diagnosis Date  . Cataracts, bilateral    right eye - just watching  . Diverticulitis   . GERD (gastroesophageal reflux disease) 2006  . Hearing loss    right ear - no hearing aid  . Heart attack ([facility]) 2008  . Hypertension   . Irregular heart beat 1973  . SVD (spontaneous vaginal delivery)    x 3     Family History  Problem Relation Age of  Onset  . Asthma Mother   . Heart disease Mother   . Stroke Mother   . Diabetes Sister   . Cancer Maternal Grandmother        Lukemia  . Breast cancer Neg Hx      Current Outpatient Medications:  .  aspirin 81 MG tablet, Take 81 mg by mouth every other day. , Disp: , Rfl:  .  B COMPLEX-ZINC PO, Take 1 tablet by mouth daily., Disp: , Rfl:  .  clobetasol cream (TEMOVATE) 0.05 %, APPLY TO AFFECTED AREA TWICE A DAY AS NEEDED, Disp: 30 g, Rfl: 0 .  ibuprofen (ADVIL,MOTRIN) 200 MG tablet, Take 400-600 mg by mouth every 6 (six) hours as needed for moderate pain., Disp: , Rfl:  .  triamcinolone cream (KENALOG) 0.5 %, APPLY 1 APPLICATION TOPICALLY 2 (TWO) TIMES DAILY. APPLY TO RASH ON RT ARM AND SIDE, Disp: 60 g, Rfl: 0 .  atorvastatin (LIPITOR) 10 MG tablet, TAKE 1 TABLET BY MOUTH EVERY DAY (Patient not taking: Reported on 03/28/2019), Disp: 90 tablet, Rfl: 0   Allergies  Allergen Reactions  . Sulfonamide Derivatives     Rash/itching      Review of Systems  Constitutional: Negative for chills and fever.  Cardiovascular: Negative.   Gastrointestinal: Negative for abdominal pain and diarrhea.  Skin: Negative.   Neurological: Negative for dizziness and headaches.  Psychiatric/Behavioral: Negative.        Today's Vitals   04/19/19 0853  BP: 138/86  Pulse: 84  Temp: 98.6 F (37 C)  TempSrc: Oral  Weight: 173 lb 9.6 oz (78.7 kg)  Height: 5' 3.4" (1.61 m)   Body mass index is 30.37 kg/m.   Objective:  Physical Exam Constitutional:      General: She is not in acute distress.    Appearance: Normal appearance.  Cardiovascular:     Rate and Rhythm: Normal rate and regular rhythm.     Pulses: Normal pulses.     Heart sounds: Normal heart sounds. No murmur.  Pulmonary:     Effort: Pulmonary effort is normal. No respiratory distress.     Breath sounds: Normal breath sounds.  Genitourinary:    Comments: Hypopigmented labia majora and vulva, no discharge present  Skin:    Capillary  Refill: Capillary refill takes less than 2 seconds.  Neurological:     General: No focal deficit present.     Mental Status: She is alert and oriented to person, place, and time.  Psychiatric:        Mood and Affect: Mood normal.        Behavior: Behavior normal.        Thought Content: Thought content normal.        Judgment: Judgment normal.         Assessment And Plan:     1. Dermatitis  Needs refill for rash on arm - triamcinolone cream (KENALOG) 0.5 %; Apply 1 application topically 2 (two) times daily. Apply to rash on  RT arm and side  Dispense: 60 g; Refill: 0  2. Chronic pain of both shoulders  Likely arthritis related to overuse  Will treat with prednisone taper  3. Essential hypertension . B/P is controlled.  . CMP ordered to check renal function.  . The importance of regular exercise and dietary modification was stressed to the patient.  . Stressed importance of losing ten percent of her body weight to help with B/P control.  . The weight loss would help with decreasing cardiac and cancer risk as well.  - BMP8+eGFR  4. Hyperlipidemia, unspecified hyperlipidemia type Chronic, not taking any medications at this time due to her concerns of the side effects - Lipid panel  5. Acute vaginitis  Will check for yeast and bacterial vaginosis  She does have some hypopigmented skin to labia majora - Cervicovaginal ancillary only   [name] Moore, FNP    THE PATIENT IS ENCOURAGED TO PRACTICE SOCIAL DISTANCING DUE TO THE COVID-19 PANDEMIC.   

## 2019-04-20 ENCOUNTER — Telehealth: Payer: Self-pay

## 2019-04-20 LAB — LIPID PANEL
Chol/HDL Ratio: 5.9 ratio — ABNORMAL HIGH (ref 0.0–4.4)
Cholesterol, Total: 234 mg/dL — ABNORMAL HIGH (ref 100–199)
HDL: 40 mg/dL (ref >39)
LDL Chol Calc (NIH): 167 mg/dL — ABNORMAL HIGH (ref 0–99)
Triglycerides: 146 mg/dL (ref 0–149)
VLDL Cholesterol Cal: 27 mg/dL (ref 5–40)

## 2019-04-20 LAB — CERVICOVAGINAL ANCILLARY ONLY
Bacterial Vaginitis (gardnerella): NEGATIVE
Candida Glabrata: NEGATIVE
Candida Vaginitis: NEGATIVE
Comment: NEGATIVE
Comment: NEGATIVE
Comment: NEGATIVE

## 2019-04-20 LAB — BMP8+EGFR
BUN/Creatinine Ratio: 8 — ABNORMAL LOW (ref 12–28)
BUN: 7 mg/dL — ABNORMAL LOW (ref 8–27)
CO2: 25 mmol/L (ref 20–29)
Calcium: 9.3 mg/dL (ref 8.7–10.3)
Chloride: 103 mmol/L (ref 96–106)
Creatinine, Ser: 0.85 mg/dL (ref 0.57–1.00)
GFR calc Af Amer: 81 mL/min/{1.73_m2} (ref >59)
GFR calc non Af Amer: 71 mL/min/{1.73_m2} (ref >59)
Glucose: 88 mg/dL (ref 65–99)
Potassium: 4.4 mmol/L (ref 3.5–5.2)
Sodium: 142 mmol/L (ref 134–144)

## 2019-04-20 NOTE — Telephone Encounter (Signed)
LEFT VM FOR PT TO RETURN CALL FOR LAB RESULTS

## 2019-04-20 NOTE — Telephone Encounter (Signed)
-----   Message from [name], [place] sent at 04/20/2019 11:04 AM EST ----- Your kidney functions are normal. Your cholesterol levels remain elevated, are you at least willing to take a cholesterol medication 3 times a week.  This increases your risk for heart attacks and strokes?  Limit your intake of cheese and the yolk of the egg.

## 2019-04-23 ENCOUNTER — Encounter: Payer: Self-pay | Admitting: Nurse Practitioner

## 2019-04-24 ENCOUNTER — Encounter: Payer: Self-pay | Admitting: Nurse Practitioner

## 2019-04-24 ENCOUNTER — Other Ambulatory Visit: Payer: Self-pay | Admitting: Nurse Practitioner

## 2019-04-25 ENCOUNTER — Ambulatory Visit (INDEPENDENT_AMBULATORY_CARE_PROVIDER_SITE_OTHER): Payer: Medicare HMO | Admitting: Nurse Practitioner

## 2019-04-25 ENCOUNTER — Encounter: Payer: Self-pay | Admitting: Nurse Practitioner

## 2019-04-25 ENCOUNTER — Other Ambulatory Visit: Payer: Self-pay

## 2019-04-25 VITALS — BP 128/86 | HR 74 | Temp 98.3°F | Ht 63.4 in | Wt 173.2 lb

## 2019-04-25 DIAGNOSIS — R1013 Epigastric pain: Secondary | ICD-10-CM

## 2019-04-25 DIAGNOSIS — Z803 Family history of malignant neoplasm of breast: Secondary | ICD-10-CM | POA: Diagnosis not present

## 2019-04-25 DIAGNOSIS — R768 Other specified abnormal immunological findings in serum: Secondary | ICD-10-CM

## 2019-04-25 MED ORDER — DEXLANSOPRAZOLE 60 MG PO CPDR: 60.0000 mg | DELAYED_RELEASE_CAPSULE | Freq: Every day | ORAL | 2 refills | Status: DC

## 2019-04-25 NOTE — Progress Notes (Signed)
This visit occurred during the SARS-CoV-2 public health emergency.  Safety protocols were in place, including screening questions prior to the visit, additional usage of staff PPE, and extensive cleaning of exam room while observing appropriate contact time as indicated for disinfecting solutions.  Subjective:     Patient ID: Wendy Mosley , female    DOB: 11/06/1950 , 69 y.o.   MRN: [id number]   Chief Complaint  Patient presents with  . Abdominal Pain    when eating    HPI  She is eating a healthier diet with fruit, yogurt and eggs. She had abdominal pain but has been worse since cutting back on the red meat for the last 6 months.  She had her last colonoscopy in 2017.  She has taken omeprazole in the past.  She has taken zantac in the past without relief  Abdominal Pain This is a chronic problem. The current episode started 1 to 4 weeks ago. The onset quality is gradual. The problem occurs intermittently. The problem has been gradually worsening. The pain is located in the epigastric region. The quality of the pain is aching. Pertinent negatives include no anorexia, fever or headaches. Associated symptoms comments: Sometimes will make her feel like she has to have a bowel movement.  . The pain is aggravated by eating. Treatments tried: ginger ale and vinegar.   There is no history of abdominal surgery or GERD.     Past Medical History:  Diagnosis Date  . Cataracts, bilateral    right eye - just watching  . Diverticulitis   . GERD (gastroesophageal reflux disease) 2006  . Hearing loss    right ear - no hearing aid  . Heart attack ([address]) 2008  . Hypertension   . Irregular heart beat 1973  . SVD (spontaneous vaginal delivery)    x 3     Family History  Problem Relation Age of Onset  . Asthma Mother   . Heart disease Mother   . Stroke Mother   . Diabetes Sister   . Cancer Maternal Grandmother        Lukemia  . Breast cancer Neg Hx      Current Outpatient Medications:  .   aspirin 81 MG tablet, Take 81 mg by mouth every other day. , Disp: , Rfl:  .  B COMPLEX-ZINC PO, Take 1 tablet by mouth daily., Disp: , Rfl:  .  clobetasol cream (TEMOVATE) 0.05 %, APPLY TO AFFECTED AREA TWICE A DAY AS NEEDED, Disp: 30 g, Rfl: 0 .  ibuprofen (ADVIL,MOTRIN) 200 MG tablet, Take 400-600 mg by mouth every 6 (six) hours as needed for moderate pain., Disp: , Rfl:  .  predniSONE (STERAPRED UNI-PAK 21 TAB) 10 MG (21) TBPK tablet, Take as directed, Disp: 21 tablet, Rfl: 0 .  triamcinolone cream (KENALOG) 0.5 %, Apply 1 application topically 2 (two) times daily. Apply to rash on  RT arm and side, Disp: 60 g, Rfl: 0   Allergies  Allergen Reactions  . Sulfonamide Derivatives     Rash/itching      Review of Systems  Constitutional: Negative for fever.  Gastrointestinal: Positive for abdominal pain. Negative for anorexia.  Neurological: Negative for headaches.     Today's Vitals   04/25/19 0957  BP: 128/86  Pulse: 74  Temp: 98.3 F (36.8 C)  TempSrc: Oral  SpO2: 94%  Weight: 173 lb 3.2 oz (78.6 kg)  Height: 5' 3.4" (1.61 m)  PainSc: 4   PainLoc: Abdomen  Body mass index is 30.3 kg/m.   Objective:  Physical Exam Constitutional:      Appearance: She is well-developed.  Neurological:     Mental Status: She is alert.         Assessment And Plan:    1. Epigastric pain  Will check for h pylori and given samples of dexilant as prilosec and protonix has been ineffective  Mild tenderness noted on palpation to epigastric area. - Liver Profile - H Pylori, IGM, IGG, IGA AB - dexlansoprazole (DEXILANT) 60 MG capsule; Take 1 capsule (60 mg total) by mouth daily.  Dispense: 30 capsule; Refill: 2  2. Family history of breast cancer in first degree relative  Daughter has been diagnosed with breast cancer positive for the BRCA gene   She would like to be tested will need to find out how to order through [facility]     [name], [place] DISTANCING DUE TO THE COVID-19 PANDEMIC.

## 2019-04-26 LAB — HEPATIC FUNCTION PANEL
ALT: 18 IU/L (ref 0–32)
AST: 16 IU/L (ref 0–40)
Albumin: 4.9 g/dL — ABNORMAL HIGH (ref 3.8–4.8)
Alkaline Phosphatase: 132 IU/L — ABNORMAL HIGH (ref 39–117)
Bilirubin Total: 0.3 mg/dL (ref 0.0–1.2)
Bilirubin, Direct: 0.08 mg/dL (ref 0.00–0.40)
Total Protein: 7.9 g/dL (ref 6.0–8.5)

## 2019-04-26 LAB — H PYLORI, IGM, IGG, IGA AB
H pylori, IgM Abs: <9 units (ref 0.0–8.9)
H. pylori, IgA Abs: 17.8 units — ABNORMAL HIGH (ref 0.0–8.9)
H. pylori, IgG AbS: 0.49 Index Value (ref 0.00–0.79)

## 2019-04-27 ENCOUNTER — Other Ambulatory Visit: Payer: Medicare HMO

## 2019-04-27 MED ORDER — METRONIDAZOLE 500 MG PO TABS: 500.0000 mg | ORAL_TABLET | Freq: Three times a day (TID) | ORAL | 0 refills | Status: AC

## 2019-04-27 MED ORDER — OMEPRAZOLE 20 MG PO CPDR: 20.0000 mg | DELAYED_RELEASE_CAPSULE | Freq: Every day | ORAL | 1 refills | Status: DC

## 2019-04-27 MED ORDER — AMOXICILLIN 875 MG PO TABS: 875.0000 mg | ORAL_TABLET | Freq: Two times a day (BID) | ORAL | 0 refills | Status: DC

## 2019-05-03 ENCOUNTER — Ambulatory Visit: Payer: Medicare HMO | Admitting: Nurse Practitioner

## 2019-05-10 ENCOUNTER — Other Ambulatory Visit: Payer: Self-pay | Admitting: Nurse Practitioner

## 2019-05-10 DIAGNOSIS — R1013 Epigastric pain: Secondary | ICD-10-CM

## 2019-05-10 DIAGNOSIS — R768 Other specified abnormal immunological findings in serum: Secondary | ICD-10-CM

## 2019-05-18 ENCOUNTER — Telehealth: Payer: Self-pay

## 2019-05-23 ENCOUNTER — Other Ambulatory Visit: Payer: Self-pay

## 2019-05-23 DIAGNOSIS — R768 Other specified abnormal immunological findings in serum: Secondary | ICD-10-CM

## 2019-05-23 DIAGNOSIS — R1013 Epigastric pain: Secondary | ICD-10-CM

## 2019-05-23 MED ORDER — OMEPRAZOLE 20 MG PO CPDR: DELAYED_RELEASE_CAPSULE | ORAL | 0 refills | Status: DC

## 2019-05-31 ENCOUNTER — Ambulatory Visit (INDEPENDENT_AMBULATORY_CARE_PROVIDER_SITE_OTHER): Payer: Medicare HMO

## 2019-05-31 DIAGNOSIS — K3189 Other diseases of stomach and duodenum: Secondary | ICD-10-CM | POA: Diagnosis not present

## 2019-05-31 DIAGNOSIS — I10 Essential (primary) hypertension: Secondary | ICD-10-CM | POA: Diagnosis not present

## 2019-05-31 DIAGNOSIS — R1013 Epigastric pain: Secondary | ICD-10-CM

## 2019-05-31 DIAGNOSIS — B9681 Helicobacter pylori [H. pylori] as the cause of diseases classified elsewhere: Secondary | ICD-10-CM | POA: Diagnosis not present

## 2019-05-31 NOTE — Chronic Care Management (AMB) (Signed)
Chronic Care Management    Social Work Follow Up Note  05/31/2019 Name: Wendy Mosley MRN: [id number] DOB: 01-Sep-1950  Wendy Mosley is a 69 y.o. year old female who is a primary care patient of [name], [place]. The CCM team was consulted for assistance with care coordination.   Review of patient status, including review of consultants reports, other relevant assessments, and collaboration with appropriate care team members and the patient's provider was performed as part of comprehensive patient evaluation and provision of chronic care management services.    SDOH (Social Determinants of Health) assessments performed: No    Outpatient Encounter Medications as of 05/31/2019  Medication Sig  . amoxicillin (AMOXIL) 875 MG tablet Take 1 tablet (875 mg total) by mouth 2 (two) times daily.  [name] aspirin 81 MG tablet Take 81 mg by mouth every other day.   . B COMPLEX-ZINC PO Take 1 tablet by mouth daily.  . clobetasol cream (TEMOVATE) 0.05 % APPLY TO AFFECTED AREA TWICE A DAY AS NEEDED  . dexlansoprazole (DEXILANT) 60 MG capsule Take 1 capsule (60 mg total) by mouth daily.  [name] ibuprofen (ADVIL,MOTRIN) 200 MG tablet Take 400-600 mg by mouth every 6 (six) hours as needed for moderate pain.  [name] omeprazole (PRILOSEC) 20 MG capsule TAKE 1 CAPSULE BY MOUTH EVERY DAY  . predniSONE (STERAPRED UNI-PAK 21 TAB) 10 MG (21) TBPK tablet Take as directed  . triamcinolone cream (KENALOG) 0.5 % Apply 1 application topically 2 (two) times daily. Apply to rash on  RT arm and side   No facility-administered encounter medications on file as of 05/31/2019.     Goals Addressed            This Visit's Progress     Patient Stated   . "I have pressure and intermittent pain in my lower abdomen" (pt-stated)       Current Barriers:  [name] Knowledge Deficits related to etiology for lower abdominal pain  Nurse Case Manager Clinical Goal(s):  [name] Over the next 60 days, patient will work with established Gynecologist  to  address needs related to unresolved uterine pain  -Goal Not Met . 10/14/18 Over the next 90 days, patient will follow up with her Gynecologist to address needs related to unresolved uterine pain . New 05/31/2019- Over the next 60 days the patient will work with care team to develop a plan to address chronic abdominal pain   CCM SW Interventions: Completed 05/31/2019 . Performed chart review to note recent OV on 04/25/19 with primary provider to address epigastric pain o Patient diagnosed as Helicobacter Pylori antibody positive o Noted patient started on several new medications - Amoxicillin 875 mg PO BID - Dexlansoprazole 60 mg PO daily - Metronidazole 500 mg PO TID - Omeprazole 20 mg PO daily . Successful outbound call placed to the patient to assist with care coordination needs . Determined the patient continues to experience some abdominal discomfort but notes an improvement since ABT . Discussed opportunity to follow up with provider to review symptoms and determine if follow up tests or medications are needed o Informed by the patient "I want an MRI of my entire body to see what is going on" o Assessed for alternative concerns the patient may have - Identified concerns with chronic headache in which the patient attribute to dental concerns as well as worsening cataract - Discussed importance of follow up with ophthalmologist to address concerns with worsening cataract - Determined the patient is currently working with her  dentist to develop a plan to address oral health and intervention needed such as fillings and extractions . Advised the patient SW would notify her primary care provider of continued abdominal pain along with ongoing complaint of headache to determine follow up plan . Collaboration with [name], FNP and Barb Merino, RN Case Manager . Scheduled follow up call with the patient over the next week to review plan and assist with ongoing care coordination  needs   Patient Self Care Activities:  . Self administers medications as prescribed . Attends all scheduled provider appointments . Calls pharmacy for medication refills . Attends church or other social activities . Performs ADL's independently . Performs IADL's independently . Calls provider office for new concerns or questions  Please see past updates related to this goal by clicking on the "Past Updates" button in the selected goal       . "I still need a blood pressure cuff" (pt-stated)       [place] (see longtitudinal plan of care for additional care plan information)  Current Barriers:  . Financial constraints related to cost of medical supplies . Uncontrolled Hypertension . Limited knowledge of health plan benefits  Social Work Clinical Goal(s):  [name] Over the next 30 days the patient will work with SW to become more knowledgeable of health plan benefits including over the counter benefit . Over the next 60 days the patient will work with SW to identify resources to assist with obtaining a blood pressure cuff  CCM SW Interventions: Completed 05/31/2019 . Successful outbound call to the patient to assist with care coordination needs . Determined the patient is still in need of a blood pressure cuff in order to monitor her own readings daily . Educated the patient on health plan benefit "over the counter" catalog . Advised the patient SW would assist with contacting her health plan to determine benefit and assist with ordering needed supplies . Scheduled outbound call to the patient for Friday April 9th to complete call to the patients health plan as the patient is unable to participate in this outreach today  Patient Self Care Activities:  . Patient verbalizes understanding of plan to work with SW to obtain a BP cuff . Self administers medications as prescribed . Attends all scheduled provider appointments . Calls provider office for new concerns or  questions  Initial goal documentation         Follow Up Plan: Appointment scheduled for SW follow up with client by phone on: April 9th.   Daneen Schick, BSW, CDP Social Worker, Certified Dementia Practitioner [place] [facility] [place] [facility] [phone]  Total time spent performing care coordination and/or care management activities with the patient by phone or face to face = 20 minutes.

## 2019-05-31 NOTE — Patient Instructions (Signed)
Social Worker Visit Information  Goals we discussed today:  Goals Addressed            This Visit's Progress     Patient Stated   . "I have pressure and intermittent pain in my lower abdomen" (pt-stated)       Current Barriers:  [name] Knowledge Deficits related to etiology for lower abdominal pain  Nurse Case Manager Clinical Goal(s):  [name] Over the next 60 days, patient will work with established Gynecologist  to address needs related to unresolved uterine pain  -Goal Not Met . 10/14/18 Over the next 90 days, patient will follow up with her Gynecologist to address needs related to unresolved uterine pain . New 05/31/2019- Over the next 60 days the patient will work with care team to develop a plan to address chronic abdominal pain   CCM SW Interventions: Completed 05/31/2019 . Performed chart review to note recent OV on 04/25/19 with primary provider to address epigastric pain o Patient diagnosed as Helicobacter Pylori antibody positive o Noted patient started on several new medications - Amoxicillin 875 mg PO BID - Dexlansoprazole 60 mg PO daily - Metronidazole 500 mg PO TID - Omeprazole 20 mg PO daily . Successful outbound call placed to the patient to assist with care coordination needs . Determined the patient continues to experience some abdominal discomfort but notes an improvement since ABT . Discussed opportunity to follow up with provider to review symptoms and determine if follow up tests or medications are needed o Informed by the patient "I want an MRI of my entire body to see what is going on" o Assessed for alternative concerns the patient may have - Identified concerns with chronic headache in which the patient attribute to dental concerns as well as worsening cataract - Discussed importance of follow up with ophthalmologist to address concerns with worsening cataract - Determined the patient is currently working with her dentist to develop a plan to address oral health and  intervention needed such as fillings and extractions . Advised the patient SW would notify her primary care provider of continued abdominal pain along with ongoing complaint of headache to determine follow up plan . Collaboration with [name], FNP and Barb Merino, RN Case Manager . Scheduled follow up call with the patient over the next week to review plan and assist with ongoing care coordination needs  Patient Self Care Activities:  . Self administers medications as prescribed . Attends all scheduled provider appointments . Calls pharmacy for medication refills . Attends church or other social activities . Performs ADL's independently . Performs IADL's independently . Calls provider office for new concerns or questions  Please see past updates related to this goal by clicking on the "Past Updates" button in the selected goal       . "I still need a blood pressure cuff" (pt-stated)       [place] (see longtitudinal plan of care for additional care plan information)  Current Barriers:  . Financial constraints related to cost of medical supplies . Uncontrolled Hypertension . Limited knowledge of health plan benefits  Social Work Clinical Goal(s):  [name] Over the next 30 days the patient will work with SW to become more knowledgeable of health plan benefits including over the counter benefit . Over the next 60 days the patient will work with SW to identify resources to assist with obtaining a blood pressure cuff  CCM SW Interventions: Completed 05/31/2019 . Successful outbound call to the patient to assist  with care coordination needs . Determined the patient is still in need of a blood pressure cuff in order to monitor her own readings daily . Educated the patient on health plan benefit "over the counter" catalog . Advised the patient SW would assist with contacting her health plan to determine benefit and assist with ordering needed supplies . Scheduled outbound call to  the patient for Friday April 9th to complete call to the patients health plan as the patient is unable to participate in this outreach today  Patient Self Care Activities:  . Patient verbalizes understanding of plan to work with SW to obtain a BP cuff . Self administers medications as prescribed . Attends all scheduled provider appointments . Calls provider office for new concerns or questions  Initial goal documentation         Materials Provided: Verbal education about over the counter benefit provided by phone  Follow Up Plan: Appointment scheduled for SW follow up with client by phone on: April 9th.   Daneen Schick, BSW, CDP Social Worker, Certified Dementia Practitioner [place] [facility] [place] Management [phone]

## 2019-06-09 ENCOUNTER — Ambulatory Visit: Payer: Self-pay

## 2019-06-09 DIAGNOSIS — I10 Essential (primary) hypertension: Secondary | ICD-10-CM

## 2019-06-09 NOTE — Chronic Care Management (AMB) (Signed)
  Chronic Care Management   Outreach Note  06/09/2019 Name: Wendy Mosley MRN: [id number] DOB: [birth year]  Referred by: [name], FNP Reason for referral : Care Coordination   SW placed an outbound call to the patient to assist with care coordination needs. The patient reported she was unavailable at the time of SW call and would contact SW at a later time.  Follow Up Plan: The care management team will reach out to the patient again over the next 7 days.   Daneen Schick, BSW, CDP Social Worker, Certified Dementia Practitioner [place] [facility] [place] [facility] [phone]

## 2019-06-13 ENCOUNTER — Ambulatory Visit (INDEPENDENT_AMBULATORY_CARE_PROVIDER_SITE_OTHER): Payer: Medicare HMO

## 2019-06-13 ENCOUNTER — Other Ambulatory Visit: Payer: Self-pay

## 2019-06-13 ENCOUNTER — Telehealth: Payer: Self-pay

## 2019-06-13 ENCOUNTER — Ambulatory Visit: Payer: Self-pay

## 2019-06-13 DIAGNOSIS — I10 Essential (primary) hypertension: Secondary | ICD-10-CM

## 2019-06-13 DIAGNOSIS — R768 Other specified abnormal immunological findings in serum: Secondary | ICD-10-CM

## 2019-06-13 DIAGNOSIS — R1013 Epigastric pain: Secondary | ICD-10-CM

## 2019-06-13 DIAGNOSIS — E785 Hyperlipidemia, unspecified: Secondary | ICD-10-CM

## 2019-06-13 DIAGNOSIS — K2100 Gastro-esophageal reflux disease with esophagitis, without bleeding: Secondary | ICD-10-CM

## 2019-06-13 NOTE — Chronic Care Management (AMB) (Signed)
Chronic Care Management    Social Work Follow Up Note  06/13/2019 Name: Wendy Mosley MRN: [id number] DOB: 11/26/1950  Wendy Mosley is a 69 y.o. year old female who is a primary care patient of [name], [place]. The CCM team was consulted for assistance with care coordination.   Review of patient status, including review of consultants reports, other relevant assessments, and collaboration with appropriate care team members and the patient's provider was performed as part of comprehensive patient evaluation and provision of chronic care management services.    SDOH (Social Determinants of Health) assessments performed: No    Outpatient Encounter Medications as of 06/13/2019  Medication Sig  . amoxicillin (AMOXIL) 875 MG tablet Take 1 tablet (875 mg total) by mouth 2 (two) times daily.  [name] aspirin 81 MG tablet Take 81 mg by mouth every other day.   . B COMPLEX-ZINC PO Take 1 tablet by mouth daily.  . clobetasol cream (TEMOVATE) 0.05 % APPLY TO AFFECTED AREA TWICE A DAY AS NEEDED  . dexlansoprazole (DEXILANT) 60 MG capsule Take 1 capsule (60 mg total) by mouth daily.  [name] ibuprofen (ADVIL,MOTRIN) 200 MG tablet Take 400-600 mg by mouth every 6 (six) hours as needed for moderate pain.  [name] omeprazole (PRILOSEC) 20 MG capsule TAKE 1 CAPSULE BY MOUTH EVERY DAY  . predniSONE (STERAPRED UNI-PAK 21 TAB) 10 MG (21) TBPK tablet Take as directed  . triamcinolone cream (KENALOG) 0.5 % Apply 1 application topically 2 (two) times daily. Apply to rash on  RT arm and side   No facility-administered encounter medications on file as of 06/13/2019.     Goals Addressed            This Visit's Progress     Patient Stated   . "I have pressure and intermittent pain in my lower abdomen" (pt-stated)       Current Barriers:  [name] Knowledge Deficits related to etiology for lower abdominal pain  Nurse Case Manager Clinical Goal(s):  [name] Over the next 60 days, patient will work with established Gynecologist  to  address needs related to unresolved uterine pain  -Goal Not Met . 10/14/18 Over the next 90 days, patient will follow up with her Gynecologist to address needs related to unresolved uterine pain . New 05/31/2019- Over the next 60 days the patient will work with care team to develop a plan to address chronic abdominal pain   CCM SW Interventions: Completed 06/13/2019 . Successful outbound call to the patient to assess for goal progression . Determined the patient is still experiencing abdominal pain without knowledge of next steps . Advised the patient SW would collaborate with care team member regarding follow up plan . Collaboration with Barb Merino, [place] requesting assistance with care coordination o See separate entry with RN Care Manager interventions  Completed 05/31/2019 . Performed chart review to note recent OV on 04/25/19 with primary provider to address epigastric pain o Patient diagnosed as Helicobacter Pylori antibody positive o Noted patient started on several new medications - Amoxicillin 875 mg PO BID - Dexlansoprazole 60 mg PO daily - Metronidazole 500 mg PO TID - Omeprazole 20 mg PO daily . Successful outbound call placed to the patient to assist with care coordination needs . Determined the patient continues to experience some abdominal discomfort but notes an improvement since ABT . Discussed opportunity to follow up with provider to review symptoms and determine if follow up tests or medications are needed o Informed by the  patient "I want an MRI of my entire body to see what is going on" o Assessed for alternative concerns the patient may have - Identified concerns with chronic headache in which the patient attribute to dental concerns as well as worsening cataract - Discussed importance of follow up with ophthalmologist to address concerns with worsening cataract - Determined the patient is currently working with her dentist to develop a plan to address oral  health and intervention needed such as fillings and extractions . Advised the patient SW would notify her primary care provider of continued abdominal pain along with ongoing complaint of headache to determine follow up plan . Collaboration with [name], FNP and Barb Merino, RN Case Manager . Scheduled follow up call with the patient over the next week to review plan and assist with ongoing care coordination needs  CCM RN CM Interventions:  10/14/18 call completed with patient   . Evaluation of current treatment plan related to uterine pain and patient's adherence to plan as established by provider . Assessed for persistent or worsening lower abdominal pain/discomfort - pt states its about the same . Reinforced for patient to schedule a follow up visit with her established GYN to further evaluate unresolved uterine pain-pt plans to schedule after COVID-19 has resolved . Discussed plans with patient for ongoing care management follow up and provided patient with direct contact information for care management team  Patient Self Care Activities:  . Self administers medications as prescribed . Attends all scheduled provider appointments . Calls pharmacy for medication refills . Attends church or other social activities . Performs ADL's independently . Performs IADL's independently . Calls provider office for new concerns or questions  Please see past updates related to this goal by clicking on the "Past Updates" button in the selected goal       . "I still need a blood pressure cuff" (pt-stated)   On track    [place] (see longtitudinal plan of care for additional care plan information)  Current Barriers:  . Financial constraints related to cost of medical supplies . Uncontrolled Hypertension . Limited knowledge of health plan benefits  Social Work Clinical Goal(s):  [name] Over the next 30 days the patient will work with SW to become more knowledgeable of health plan benefits  including over the counter benefit Goal Met . Over the next 60 days the patient will work with SW to identify resources to assist with obtaining a blood pressure cuff  CCM SW Interventions: Completed 06/13/2019 . Successful outbound call to the patient to assist with ordering a blood pressure cuff . Assisted the patient in a joint call to her health plan to verify over the counter benefits o Confirmed the patient is eligible to receive $15 per month in OTC benefits with the benefit rolling over if not used . Successful outreach to [place] [phone]) to complete OTC order o Informed the patient has a current OTC balance of $60.00 o Completed order for Automatic blood pressure cuff, Vitamin B12, and cough drops (Confirmation number [id number]) . Provided the patient with copy of [employer] OTC Catalog via e-mail correspondence for future reference on orders . Scheduled follow up appointment over the next month to confirm receipt of equipment  Patient Self Care Activities:  . Patient verbalizes understanding of plan to work with SW to obtain a BP cuff . Self administers medications as prescribed . Attends all scheduled provider appointments . Calls provider office for new concerns or questions  Please see  past updates related to this goal by clicking on the "Past Updates" button in the selected goal          Follow Up Plan: SW will follow up with patient by phone over the next month   Daneen Schick, BSW, CDP Social Worker, Certified Dementia Practitioner [place] [facility] [place] [facility] [phone]  Total time spent performing care coordination and/or care management activities with the patient by phone or face to face = 40 minutes.

## 2019-06-13 NOTE — Patient Instructions (Signed)
Social Worker Visit Information  Goals we discussed today:  Goals Addressed            This Visit's Progress     Patient Stated   . "I have pressure and intermittent pain in my lower abdomen" (pt-stated)       Current Barriers:  [name] Knowledge Deficits related to etiology for lower abdominal pain  Nurse Case Manager Clinical Goal(s):  [name] Over the next 60 days, patient will work with established Gynecologist  to address needs related to unresolved uterine pain  -Goal Not Met . 10/14/18 Over the next 90 days, patient will follow up with her Gynecologist to address needs related to unresolved uterine pain . New 05/31/2019- Over the next 60 days the patient will work with care team to develop a plan to address chronic abdominal pain   CCM SW Interventions: Completed 06/13/2019 . Successful outbound call to the patient to assess for goal progression . Determined the patient is still experiencing abdominal pain without knowledge of next steps . Advised the patient SW would collaborate with care team member regarding follow up plan . Collaboration with Barb Merino, [place] requesting assistance with care coordination o See separate entry with RN Care Manager interventions  Completed 05/31/2019 . Performed chart review to note recent OV on 04/25/19 with primary provider to address epigastric pain o Patient diagnosed as Helicobacter Pylori antibody positive o Noted patient started on several new medications - Amoxicillin 875 mg PO BID - Dexlansoprazole 60 mg PO daily - Metronidazole 500 mg PO TID - Omeprazole 20 mg PO daily . Successful outbound call placed to the patient to assist with care coordination needs . Determined the patient continues to experience some abdominal discomfort but notes an improvement since ABT . Discussed opportunity to follow up with provider to review symptoms and determine if follow up tests or medications are needed o Informed by the patient "I want an MRI  of my entire body to see what is going on" o Assessed for alternative concerns the patient may have - Identified concerns with chronic headache in which the patient attribute to dental concerns as well as worsening cataract - Discussed importance of follow up with ophthalmologist to address concerns with worsening cataract - Determined the patient is currently working with her dentist to develop a plan to address oral health and intervention needed such as fillings and extractions . Advised the patient SW would notify her primary care provider of continued abdominal pain along with ongoing complaint of headache to determine follow up plan . Collaboration with [name], FNP and Barb Merino, RN Case Manager . Scheduled follow up call with the patient over the next week to review plan and assist with ongoing care coordination needs  CCM RN CM Interventions:  10/14/18 call completed with patient   . Evaluation of current treatment plan related to uterine pain and patient's adherence to plan as established by provider . Assessed for persistent or worsening lower abdominal pain/discomfort - pt states its about the same . Reinforced for patient to schedule a follow up visit with her established GYN to further evaluate unresolved uterine pain-pt plans to schedule after COVID-19 has resolved . Discussed plans with patient for ongoing care management follow up and provided patient with direct contact information for care management team  Patient Self Care Activities:  . Self administers medications as prescribed . Attends all scheduled provider appointments . Calls pharmacy for medication refills . Attends church or other social activities .  Performs ADL's independently . Performs IADL's independently . Calls provider office for new concerns or questions  Please see past updates related to this goal by clicking on the "Past Updates" button in the selected goal       . "I still need a blood  pressure cuff" (pt-stated)   On track    [place] (see longtitudinal plan of care for additional care plan information)  Current Barriers:  . Financial constraints related to cost of medical supplies . Uncontrolled Hypertension . Limited knowledge of health plan benefits  Social Work Clinical Goal(s):  [name] Over the next 30 days the patient will work with SW to become more knowledgeable of health plan benefits including over the counter benefit Goal Met . Over the next 60 days the patient will work with SW to identify resources to assist with obtaining a blood pressure cuff  CCM SW Interventions: Completed 06/13/2019 . Successful outbound call to the patient to assist with ordering a blood pressure cuff . Assisted the patient in a joint call to her health plan to verify over the counter benefits o Confirmed the patient is eligible to receive $15 per month in OTC benefits with the benefit rolling over if not used . Successful outreach to [facility] [phone]) to complete OTC order o Informed the patient has a current OTC balance of $60.00 o Completed order for Automatic blood pressure cuff, Vitamin B12, and cough drops (Confirmation number [id number]) . Provided the patient with copy of [employer] OTC Catalog via e-mail correspondence for future reference on orders . Scheduled follow up appointment over the next month to confirm receipt of equipment  Patient Self Care Activities:  . Patient verbalizes understanding of plan to work with SW to obtain a BP cuff . Self administers medications as prescribed . Attends all scheduled provider appointments . Calls provider office for new concerns or questions  Please see past updates related to this goal by clicking on the "Past Updates" button in the selected goal          Materials Provided: Yes: education on over the counter health plan benefit  Follow Up Plan: SW will follow up with patient by phone over the next  month   Daneen Schick, BSW, CDP Social Worker, Certified Dementia Practitioner [place] [facility] [phone]

## 2019-06-14 ENCOUNTER — Encounter: Payer: Self-pay | Admitting: Nurse Practitioner

## 2019-06-14 ENCOUNTER — Ambulatory Visit (INDEPENDENT_AMBULATORY_CARE_PROVIDER_SITE_OTHER): Payer: Medicare HMO | Admitting: Nurse Practitioner

## 2019-06-14 VITALS — BP 118/78 | HR 76 | Temp 98.1°F | Ht 63.8 in | Wt 172.2 lb

## 2019-06-14 DIAGNOSIS — K2100 Gastro-esophageal reflux disease with esophagitis, without bleeding: Secondary | ICD-10-CM | POA: Diagnosis not present

## 2019-06-14 DIAGNOSIS — R1013 Epigastric pain: Secondary | ICD-10-CM | POA: Diagnosis not present

## 2019-06-14 DIAGNOSIS — R768 Other specified abnormal immunological findings in serum: Secondary | ICD-10-CM

## 2019-06-14 NOTE — Progress Notes (Signed)
This visit occurred during the SARS-CoV-2 public health emergency.  Safety protocols were in place, including screening questions prior to the visit, additional usage of staff PPE, and extensive cleaning of exam room while observing appropriate contact time as indicated for disinfecting solutions.  Subjective:     Patient ID: Wendy Mosley , female    DOB: 07/15/1950 , 69 y.o.   MRN: [id number]   Chief Complaint  Patient presents with  . Abdominal Pain    HPI  Abdominal Pain This is a chronic problem. The current episode started more than 1 month ago. The onset quality is sudden. The problem occurs intermittently. The problem has been unchanged. The pain is located in the RLQ. The pain is moderate. The quality of the pain is aching. Pertinent negatives include no fever, headaches or nausea. Nothing aggravates the pain. There is no history of GERD.     Past Medical History:  Diagnosis Date  . Cataracts, bilateral    right eye - just watching  . Diverticulitis   . GERD (gastroesophageal reflux disease) 2006  . Hearing loss    right ear - no hearing aid  . Heart attack ([place]) 2008  . Hypertension   . Irregular heart beat 1973  . SVD (spontaneous vaginal delivery)    x 3     Family History  Problem Relation Age of Onset  . Asthma Mother   . Heart disease Mother   . Stroke Mother   . Diabetes Sister   . Cancer Maternal Grandmother        Lukemia  . Breast cancer Neg Hx      Current Outpatient Medications:  .  aspirin 81 MG tablet, Take 81 mg by mouth every other day. , Disp: , Rfl:  .  B COMPLEX-ZINC PO, Take 1 tablet by mouth daily., Disp: , Rfl:  .  clobetasol cream (TEMOVATE) 0.05 %, APPLY TO AFFECTED AREA TWICE A DAY AS NEEDED, Disp: 30 g, Rfl: 0 .  dexlansoprazole (DEXILANT) 60 MG capsule, Take 1 capsule (60 mg total) by mouth daily., Disp: 30 capsule, Rfl: 2 .  ibuprofen (ADVIL,MOTRIN) 200 MG tablet, Take 400-600 mg by mouth every 6 (six) hours as needed for  moderate pain., Disp: , Rfl:  .  omeprazole (PRILOSEC) 20 MG capsule, TAKE 1 CAPSULE BY MOUTH EVERY DAY, Disp: 90 capsule, Rfl: 0 .  triamcinolone cream (KENALOG) 0.5 %, Apply 1 application topically 2 (two) times daily. Apply to rash on  RT arm and side, Disp: 60 g, Rfl: 0   Allergies  Allergen Reactions  . Sulfonamide Derivatives     Rash/itching      Review of Systems  Constitutional: Negative for fever.  Respiratory: Negative.   Cardiovascular: Negative.  Negative for chest pain, palpitations and leg swelling.  Gastrointestinal: Positive for abdominal pain. Negative for nausea and rectal pain.  Neurological: Negative for dizziness and headaches.     Today's Vitals   06/14/19 1037  BP: 118/78  Pulse: 76  Temp: 98.1 F (36.7 C)  TempSrc: Oral  SpO2: 94%  Weight: 172 lb 3.2 oz (78.1 kg)  Height: 5' 3.8" (1.621 m)   Body mass index is 29.74 kg/m.   Objective:  Physical Exam Constitutional:      Appearance: She is well-developed.  Abdominal:     General: Bowel sounds are normal.     Palpations: Abdomen is soft.     Tenderness: There is abdominal tenderness in the left lower quadrant.  Skin:  General: Skin is warm and dry.  Neurological:     General: No focal deficit present.     Mental Status: She is alert.  Psychiatric:        Mood and Affect: Mood normal. Mood is not anxious.        Behavior: Behavior normal.         Assessment And Plan:     1. Epigastric pain  Will refer to GI she has seen Dr. Collene Mares in the past  Continue with Dexilant daily  - Ambulatory referral to Gastroenterology  2. Gastroesophageal reflux disease with esophagitis, unspecified whether hemorrhage  This has been ongoing, I will refer to GI for further evaluation - Ambulatory referral to Gastroenterology  3. Helicobacter pylori antibody positive  Was treated about 3 months ago continues to have abdominal pain  Will refer  - Ambulatory referral to Gastroenterology   [name], FNP    THE PATIENT IS ENCOURAGED TO PRACTICE SOCIAL DISTANCING DUE TO THE COVID-19 PANDEMIC.

## 2019-06-14 NOTE — Chronic Care Management (AMB) (Signed)
Chronic Care Management   Follow Up Note   06/13/2019 Name: Wendy Mosley MRN: [id number] DOB: [birth year]  Referred by: [name], FNP Reason for referral : Chronic Care Management (FU RN Call - GI)   Wendy Mosley is a 69 y.o. year old female who is a primary care patient of [name], [place]. The CCM team was consulted for assistance with chronic disease management and care coordination needs.    Review of patient status, including review of consultants reports, relevant laboratory and other test results, and collaboration with appropriate care team members and the patient's provider was performed as part of comprehensive patient evaluation and provision of chronic care management services.    SDOH (Social Determinants of Health) assessments performed: No See Care Plan activities for detailed interventions related to Wendy Mosley)   Placed outbound call to patient to f/u on unresolved abdominal pain.     Outpatient Encounter Medications as of 06/13/2019  Medication Sig  . amoxicillin (AMOXIL) 875 MG tablet Take 1 tablet (875 mg total) by mouth 2 (two) times daily.  [name] aspirin 81 MG tablet Take 81 mg by mouth every other day.   . B COMPLEX-ZINC PO Take 1 tablet by mouth daily.  . clobetasol cream (TEMOVATE) 0.05 % APPLY TO AFFECTED AREA TWICE A DAY AS NEEDED  . dexlansoprazole (DEXILANT) 60 MG capsule Take 1 capsule (60 mg total) by mouth daily.  [name] ibuprofen (ADVIL,MOTRIN) 200 MG tablet Take 400-600 mg by mouth every 6 (six) hours as needed for moderate pain.  [name] omeprazole (PRILOSEC) 20 MG capsule TAKE 1 CAPSULE BY MOUTH EVERY DAY  . predniSONE (STERAPRED UNI-PAK 21 TAB) 10 MG (21) TBPK tablet Take as directed  . triamcinolone cream (KENALOG) 0.5 % Apply 1 application topically 2 (two) times daily. Apply to rash on  RT arm and side   No facility-administered encounter medications on file as of 06/13/2019.     Objective:  No results found for: HGBA1C Lab Results  Component Value  Date   MICROALBUR 10 09/15/2018   LDLCALC 167 (H) 04/19/2019   CREATININE 0.85 04/19/2019   BP Readings from Last 3 Encounters:  04/25/19 128/86  04/19/19 138/86  03/28/19 140/82    Goals Addressed      Patient Stated   . "I have pressure and intermittent pain in my lower abdomen" (pt-stated)       Current Barriers:  [name] Knowledge Deficits related to etiology for lower abdominal pain  Chronic Disease Management support and education needs related to HTN, HLD, GERD, Epigastric Pain, H Pylori  Nurse Case Manager Clinical Goal(s):  [name] Over the next 60 days, patient will work with Designer, fashion/clothing  to address needs related to unresolved uterine pain  -Goal Not Met . 10/14/18 Over the next 90 days, patient will follow up with her Gynecologist to address needs related to unresolved uterine pain -Goal Not Met . New 05/31/2019- Over the next 60 days the patient will work with care team to develop a plan to address chronic abdominal pain   CCM SW Interventions: Completed 06/13/2019 . Successful outbound call to the patient to assess for goal progression . Determined the patient is still experiencing abdominal pain without knowledge of next steps . Advised the patient SW would collaborate with care team member regarding follow up plan . Collaboration with Barb Merino, [place] requesting assistance with care coordination o See separate entry with RN Care Manager interventions  CCM RN CM Interventions:  06/13/19 call completed  with patient  . Collaborated with embedded [place] regarding patient's unresolved abdominal pain  . Evaluation of current treatment plan related to unresolved abdominal pain and patient's adherence to plan as established by provider . Determined patient f/u with PCP provider [name], FNP on 04/25/19 for further evaluation; Determined patient was determined patient tested positive for H Pylori; Determined she completed the following  medications; Amoxicillin 875 mg Oral 2 times daily, Dexlansoprazole 60 mg daily, MetroNidazole 500 mg tid (patient completed), Omeprazole 20 mg daily (patient continues to take as prescribed) . Determined patient continues to have unresolved lower abdominal pain and is concerned something more serious may be going on . Reviewed and discussed patient's procedure completed in 12/20 requiring a D&C Hysteroscopy for endometrial thickening and removal of a benign polyp . Determined patient found temporary relief from her abdominal pain for a few months following the procedure, however, the pain reoccured . Determined patient did not f/u with her GYN post operatively due to [place] . Further discussed patient was referred to Dr. Collene Mares, Gastroenterologist a several years ago but has not f/u with GI since abdominal pain started . Discussed having patient contact her GYN office to schedule a f/u visit to further evaluate Endometrium  . Determined patient agrees with this plan and will f/u with PCP for further evaluation and recommendations (patient scheduled for 06/14/19 _0 :45 AM) . Discussed patient will contact GI to schedule a f/u visit as appropriate and or if recommended by PCP  . Sent in basket message to embedded BSW and PCP with an update for planned follow up with PCP and GYN  . Discussed plans with patient for ongoing care management follow up and provided patient with direct contact information for care management team  Patient Self Care Activities:  . Self administers medications as prescribed . Attends all scheduled provider appointments . Calls pharmacy for medication refills . Attends church or other social activities . Performs ADL's independently . Performs IADL's independently . Calls provider office for new concerns or questions  Please see past updates related to this goal by clicking on the "Past Updates" button in the selected goal          Plan:   Telephone follow up  appointment with care management team member scheduled for: 06/21/19  Barb Merino, RN, BSN, CCM Care Management Coordinator [place] [facility]  Direct Phone: [phone]

## 2019-06-14 NOTE — Patient Instructions (Addendum)
Visit Information  Goals Addressed      Patient Stated   . "I have pressure and intermittent pain in my lower abdomen" (pt-stated)       Current Barriers:  [name] Knowledge Deficits related to etiology for lower abdominal pain  Chronic Disease Management support and education needs related to HTN, HLD, GERD, Epigastric Pain, H Pylori  Nurse Case Manager Clinical Goal(s):  [name] Over the next 60 days, patient will work with Designer, fashion/clothing  to address needs related to unresolved uterine pain  -Goal Not Met . 10/14/18 Over the next 90 days, patient will follow up with her Gynecologist to address needs related to unresolved uterine pain -Goal Not Met . New 05/31/2019- Over the next 60 days the patient will work with care team to develop a plan to address chronic abdominal pain   CCM SW Interventions: Completed 06/13/2019 . Successful outbound call to the patient to assess for goal progression . Determined the patient is still experiencing abdominal pain without knowledge of next steps . Advised the patient SW would collaborate with care team member regarding follow up plan . Collaboration with Barb Merino, [place] requesting assistance with care coordination o See separate entry with [place] interventions  CCM RN CM Interventions:  06/13/19 call completed with patient  . Collaborated with embedded [place] regarding patient's unresolved abdominal pain  . Evaluation of current treatment plan related to unresolved abdominal pain and patient's adherence to plan as established by provider . Determined patient f/u with PCP provider [name], FNP on 04/25/19 for further evaluation; Determined patient was determined patient tested positive for H Pylori; Determined she completed the following medications; Amoxicillin 875 mg Oral 2 times daily, Dexlansoprazole 60 mg daily, MetroNidazole 500 mg tid (patient completed), Omeprazole 20 mg daily (patient continues to take as  prescribed) . Determined patient continues to have unresolved lower abdominal pain and is concerned something more serious may be going on . Reviewed and discussed patient's procedure completed in 12/20 requiring a D&C Hysteroscopy for endometrial thickening and removal of a benign polyp . Determined patient found temporary relief from her abdominal pain for a few months following the procedure, however, the pain reoccured . Determined patient did not f/u with her GYN post operatively due to [place] . Further discussed patient was referred to Dr. Collene Mares, Gastroenterologist a several years ago but has not f/u with GI since abdominal pain started . Discussed having patient contact her GYN office to schedule a f/u visit to further evaluate Endometrium  . Determined patient agrees with this plan and will f/u with PCP for further evaluation and recommendations (patient scheduled for 06/14/19 '@10' :45 AM) . Discussed patient will contact GI to schedule a f/u visit as appropriate and or if recommended by PCP  . Sent in basket message to embedded BSW and PCP with an update for planned follow up with PCP and GYN  . Discussed plans with patient for ongoing care management follow up and provided patient with direct contact information for care management team  Patient Self Care Activities:  . Self administers medications as prescribed . Attends all scheduled provider appointments . Calls pharmacy for medication refills . Attends church or other social activities . Performs ADL's independently . Performs IADL's independently . Calls provider office for new concerns or questions  Please see past updates related to this goal by clicking on the "Past Updates" button in the selected goal        Patient verbalizes  understanding of instructions provided today.   Telephone follow up appointment with care management team member scheduled for: 06/21/19  Barb Merino, RN, BSN, CCM Care Management Coordinator [place] [facility]  Direct Phone: [phone]

## 2019-06-21 ENCOUNTER — Telehealth: Payer: Self-pay

## 2019-06-26 ENCOUNTER — Ambulatory Visit: Payer: Self-pay

## 2019-06-26 ENCOUNTER — Other Ambulatory Visit: Payer: Self-pay

## 2019-06-26 ENCOUNTER — Telehealth: Payer: Self-pay

## 2019-06-26 DIAGNOSIS — K219 Gastro-esophageal reflux disease without esophagitis: Secondary | ICD-10-CM

## 2019-06-26 DIAGNOSIS — E785 Hyperlipidemia, unspecified: Secondary | ICD-10-CM

## 2019-06-26 DIAGNOSIS — I10 Essential (primary) hypertension: Secondary | ICD-10-CM | POA: Diagnosis not present

## 2019-06-27 ENCOUNTER — Other Ambulatory Visit: Payer: Self-pay | Admitting: Nurse Practitioner

## 2019-06-27 DIAGNOSIS — Z8669 Personal history of other diseases of the nervous system and sense organs: Secondary | ICD-10-CM

## 2019-06-28 NOTE — Chronic Care Management (AMB) (Signed)
Chronic Care Management   Follow Up Note   06/27/2019 Name: Wendy Mosley MRN: [id number] DOB: 05-14-1950  Referred by: [name], FNP Reason for referral : Chronic Care Management (FU RN Call- GI pain/PCP f/u )   Wendy Mosley is a 69 y.o. year old female who is a primary care patient of [name], [place]. The CCM team was consulted for assistance with chronic disease management and care coordination needs.    Review of patient status, including review of consultants reports, relevant laboratory and other test results, and collaboration with appropriate care team members and the patient's provider was performed as part of comprehensive patient evaluation and provision of chronic care management services.    SDOH (Social Determinants of Health) assessments performed: Yes - no acute challenges identified at this time.  See Care Plan activities for detailed interventions related to SDOH)   Placed CCM RN CM outbound call to patient to f/u on GI symptoms.     Outpatient Encounter Medications as of 06/26/2019  Medication Sig  . dexlansoprazole (DEXILANT) 60 MG capsule Take 1 capsule (60 mg total) by mouth daily.  Wendy Mosley aspirin 81 MG tablet Take 81 mg by mouth every other day.   . B COMPLEX-ZINC PO Take 1 tablet by mouth daily.  . clobetasol cream (TEMOVATE) 0.05 % APPLY TO AFFECTED AREA TWICE A DAY AS NEEDED  . ibuprofen (ADVIL,MOTRIN) 200 MG tablet Take 400-600 mg by mouth every 6 (six) hours as needed for moderate pain.  Wendy Mosley omeprazole (PRILOSEC) 20 MG capsule TAKE 1 CAPSULE BY MOUTH EVERY DAY (Patient not taking: Reported on 06/27/2019)  . triamcinolone cream (KENALOG) 0.5 % Apply 1 application topically 2 (two) times daily. Apply to rash on  RT arm and side   No facility-administered encounter medications on file as of 06/26/2019.     Objective:  No results found for: HGBA1C Lab Results  Component Value Date   MICROALBUR 10 09/15/2018   LDLCALC 167 (H) 04/19/2019   CREATININE  0.85 04/19/2019   BP Readings from Last 3 Encounters:  06/14/19 118/78  04/25/19 128/86  04/19/19 138/86    Goals Addressed            This Visit's Progress     Patient Stated   . "I have pressure and intermittent pain in my lower abdomen" (pt-stated)       Current Barriers:  Wendy Mosley Knowledge Deficits related to etiology for lower abdominal pain  Chronic Disease Management support and education needs related to HTN, HLD, GERD, Epigastric Pain, H Pylori  Nurse Case Manager Clinical Goal(s):  Wendy Mosley Over the next 60 days, patient will work with Designer, fashion/clothing  to address needs related to unresolved uterine pain  -Goal Not Met . 10/14/18 Over the next 90 days, patient will follow up with her Gynecologist to address needs related to unresolved uterine pain -Goal Not Met . New 05/31/2019- Over the next 60 days the patient will work with care team to develop a plan to address chronic abdominal pain   CCM RN CM Interventions:  06/27/19 call completed with patient  . Evaluation of current treatment plan related to unresolved abdominal pain and patient's adherence to plan as established by provider . Discussed patient completed an OV with PCP provider [name], FNP for further evaluation of her lower abdominal pain . Determined Wendy Mosley provided patient with Dexilant samples in which Wendy Mosley is getting good effectiveness, states, "my pain gets better when I take the medicine" . Discussed  PCP sent new referral for GI consult with Dr. Collene Mosley to further evaluate GI symptoms  . Reiterated having patient contact her GYN office to schedule a f/u visit to further evaluate/treat Endometriosis  . Discussed plans with patient for ongoing care management follow up and provided patient with direct contact information for care management team  Patient Self Care Activities:  . Self administers medications as prescribed . Attends all scheduled provider appointments . Calls pharmacy for medication  refills . Attends church or other social activities . Performs ADL's independently . Performs IADL's independently . Calls provider office for new concerns or questions  Please see past updates related to this goal by clicking on the "Past Updates" button in the selected goal     . "I need to get established with a new Eye doctor" (pt-stated)       [place] (see longitudinal plan of care for additional care plan information)  Current Barriers:  . Cataract to right eye . Does not have Eye Specialist  . Chronic Disease Management support and education needs related to HTN, HLD, GERD  Nurse Case Manager Clinical Goal(s):  Wendy Mosley Over the next 30 days, patient will work with CCM RN and PCP to address needs related to evaluation and treatment of Cataract to right eye  CCM RN CM Interventions:  06/27/19 call completed with patient  . Inter-disciplinary care team collaboration (see longitudinal plan of care) . Evaluation of current treatment plan related to Cataract to right eye and patient's adherence to plan as established by provider. Nash Dimmer with PCP [name], FNP via in basket message  regarding referral needed for Eye Specialist . Discussed plans with patient for ongoing care management follow up and provided patient with direct contact information for care management team  Patient Self Care Activities:  . Self administers medications as prescribed . Attends all scheduled provider appointments . Calls pharmacy for medication refills . Performs ADL's independently . Performs IADL's independently . Calls provider office for new concerns or questions  Initial goal documentation        Plan:   Telephone follow up appointment with care management team member scheduled for: 07/03/19  Barb Merino, RN, BSN, CCM Care Management Coordinator [place] [facility]  Direct Phone: [phone]

## 2019-06-28 NOTE — Patient Instructions (Signed)
Visit Information  Goals Addressed      Patient Stated   . "I have pressure and intermittent pain in my lower abdomen" (pt-stated)       Current Barriers:  [name] Knowledge Deficits related to etiology for lower abdominal pain  Chronic Disease Management support and education needs related to HTN, HLD, GERD, Epigastric Pain, H Pylori  Nurse Case Manager Clinical Goal(s):  [name] Over the next 60 days, patient will work with Designer, fashion/clothing  to address needs related to unresolved uterine pain  -Goal Not Met . 10/14/18 Over the next 90 days, patient will follow up with her Gynecologist to address needs related to unresolved uterine pain -Goal Not Met . New 05/31/2019- Over the next 60 days the patient will work with care team to develop a plan to address chronic abdominal pain   CCM RN CM Interventions:  06/27/19 call completed with patient  . Evaluation of current treatment plan related to unresolved abdominal pain and patient's adherence to plan as established by provider . Discussed patient completed an OV with PCP provider [name], FNP for further evaluation of her lower abdominal pain . Determined [name] provided patient with Dexilant samples in which Mrs. Baune is getting good effectiveness, states, "my pain gets better when I take the medicine" . Discussed PCP sent new referral for GI consult with Dr. Collene Mares to further evaluate GI symptoms  . Reiterated having patient contact her GYN office to schedule a f/u visit to further evaluate/treat Endometriosis  . Discussed plans with patient for ongoing care management follow up and provided patient with direct contact information for care management team  Patient Self Care Activities:  . Self administers medications as prescribed . Attends all scheduled provider appointments . Calls pharmacy for medication refills . Attends church or other social activities . Performs ADL's independently . Performs IADL's independently . Calls provider  office for new concerns or questions  Please see past updates related to this goal by clicking on the "Past Updates" button in the selected goal      . "I need to get established with a new Eye doctor" (pt-stated)       [place] (see longitudinal plan of care for additional care plan information)  Current Barriers:  . Cataract to right eye . Does not have Eye Specialist  . Chronic Disease Management support and education needs related to HTN, HLD, GERD  Nurse Case Manager Clinical Goal(s):  [name] Over the next 30 days, patient will work with CCM RN and PCP to address needs related to evaluation and treatment of Cataract to right eye  CCM RN CM Interventions:  06/27/19 call completed with patient  . Inter-disciplinary care team collaboration (see longitudinal plan of care) . Evaluation of current treatment plan related to Cataract to right eye and patient's adherence to plan as established by provider. Nash Dimmer with PCP [name], FNP via in basket message  regarding referral needed for Eye Specialist . Discussed plans with patient for ongoing care management follow up and provided patient with direct contact information for care management team  Patient Self Care Activities:  . Self administers medications as prescribed . Attends all scheduled provider appointments . Calls pharmacy for medication refills . Performs ADL's independently . Performs IADL's independently . Calls provider office for new concerns or questions  Initial goal documentation       Patient verbalizes understanding of instructions provided today.   Telephone follow up appointment with care management team member  scheduled for: 07/03/19  Barb Merino, RN, BSN, CCM Care Management Coordinator [place] [facility]  Direct Phone: [phone]

## 2019-06-29 ENCOUNTER — Ambulatory Visit: Payer: Self-pay

## 2019-06-29 ENCOUNTER — Telehealth: Payer: Self-pay

## 2019-06-29 DIAGNOSIS — I10 Essential (primary) hypertension: Secondary | ICD-10-CM

## 2019-06-29 NOTE — Chronic Care Management (AMB) (Signed)
  Chronic Care Management   Outreach Note  06/29/2019 Name: Wendy Mosley MRN: [id number] DOB: 1950-09-19  Referred by: [name], FNP Reason for referral : Care Coordination  SW placed an unsuccessful outbound call to the patient in response to communication received from [place] requesting follow up on status of OTC supplies ordered. SW left a HIPAA compliant voice message requesting a return call.  Follow Up Plan: SW will follow up with the patient over the next three weeks as previously scheduled.  Daneen Schick, BSW, CDP Social Worker, Certified Dementia Practitioner [place] [facility] [place] [facility] [phone]

## 2019-07-03 ENCOUNTER — Telehealth: Payer: Self-pay

## 2019-07-18 ENCOUNTER — Ambulatory Visit: Payer: Self-pay

## 2019-07-18 DIAGNOSIS — I10 Essential (primary) hypertension: Secondary | ICD-10-CM

## 2019-07-18 NOTE — Chronic Care Management (AMB) (Signed)
  Chronic Care Management    Social Work Follow Up Note  07/18/2019 Name: Wendy Mosley MRN: [id number] DOB: 1950/05/16  Wendy Mosley is a 69 y.o. year old female who is a primary care patient of [name], [place]. The CCM team was consulted for assistance with care coordination.   Review of patient status, including review of consultants reports, other relevant assessments, and collaboration with appropriate care team members and the patient's provider was performed as part of comprehensive patient evaluation and provision of chronic care management services.    SDOH (Social Determinants of Health) assessments performed: No    Outpatient Encounter Medications as of 07/18/2019  Medication Sig  . aspirin 81 MG tablet Take 81 mg by mouth every other day.   . B COMPLEX-ZINC PO Take 1 tablet by mouth daily.  . clobetasol cream (TEMOVATE) 0.05 % APPLY TO AFFECTED AREA TWICE A DAY AS NEEDED  . dexlansoprazole (DEXILANT) 60 MG capsule Take 1 capsule (60 mg total) by mouth daily.  [name] ibuprofen (ADVIL,MOTRIN) 200 MG tablet Take 400-600 mg by mouth every 6 (six) hours as needed for moderate pain.  [name] omeprazole (PRILOSEC) 20 MG capsule TAKE 1 CAPSULE BY MOUTH EVERY DAY (Patient not taking: Reported on 06/27/2019)  . triamcinolone cream (KENALOG) 0.5 % Apply 1 application topically 2 (two) times daily. Apply to rash on  RT arm and side   No facility-administered encounter medications on file as of 07/18/2019.     Goals Addressed            This Visit's Progress     Patient Stated   . COMPLETED: "I still need a blood pressure cuff" (pt-stated)       CARE PLAN ENTRY (see longtitudinal plan of care for additional care plan information)  Current Barriers:  . Financial constraints related to cost of medical supplies . Uncontrolled Hypertension . Limited knowledge of health plan benefits  Social Work Clinical Goal(s):  [name] Over the next 30 days the patient will work with SW to become more  knowledgeable of health plan benefits including over the counter benefit Goal Met . Over the next 60 days the patient will work with SW to identify resources to assist with obtaining a blood pressure cuff Goal Met   CCM SW Interventions: Completed 07/18/19 . Successful outbound call to the patient to assess progression of patient stated goal . Confirmed the patient has received equipment ordered under OTC benefit . Goal Met  Patient Self Care Activities:  . Patient verbalizes understanding of plan to work with SW to obtain a BP cuff . Self administers medications as prescribed . Attends all scheduled provider appointments . Calls provider office for new concerns or questions  Please see past updates related to this goal by clicking on the "Past Updates" button in the selected goal          Follow Up Plan: No SW follow up planned at this time. The patient will remain engaged with RN Care Manager.   Daneen Schick, BSW, CDP Social Worker, Certified Dementia Practitioner [place] [facility] [place] [facility] [phone]  Total time spent performing care coordination and/or care management activities with the patient by phone or face to face = 15 minutes.

## 2019-07-18 NOTE — Patient Instructions (Signed)
Social Worker Visit Information  Goals we discussed today:  Goals Addressed            This Visit's Progress     Patient Stated   . COMPLETED: "I still need a blood pressure cuff" (pt-stated)       CARE PLAN ENTRY (see longtitudinal plan of care for additional care plan information)  Current Barriers:  . Financial constraints related to cost of medical supplies . Uncontrolled Hypertension . Limited knowledge of health plan benefits  Social Work Clinical Goal(s):  [name] Over the next 30 days the patient will work with SW to become more knowledgeable of health plan benefits including over the counter benefit Goal Met . Over the next 60 days the patient will work with SW to identify resources to assist with obtaining a blood pressure cuff Goal Met   CCM SW Interventions: Completed 07/18/19 . Successful outbound call to the patient to assess progression of patient stated goal . Confirmed the patient has received equipment ordered under OTC benefit . Goal Met  Patient Self Care Activities:  . Patient verbalizes understanding of plan to work with SW to obtain a BP cuff . Self administers medications as prescribed . Attends all scheduled provider appointments . Calls provider office for new concerns or questions  Please see past updates related to this goal by clicking on the "Past Updates" button in the selected goal          Follow Up Plan: No SW follow up planned at this time. Please contact me with future resource needs.  Daneen Schick, BSW, CDP Social Worker, Certified Dementia Practitioner [place] [facility] [place] [facility] [phone]

## 2019-07-19 DIAGNOSIS — H04123 Dry eye syndrome of bilateral lacrimal glands: Secondary | ICD-10-CM | POA: Diagnosis not present

## 2019-07-19 DIAGNOSIS — H2513 Age-related nuclear cataract, bilateral: Secondary | ICD-10-CM | POA: Diagnosis not present

## 2019-07-19 DIAGNOSIS — H1045 Other chronic allergic conjunctivitis: Secondary | ICD-10-CM | POA: Diagnosis not present

## 2019-07-26 DIAGNOSIS — R1310 Dysphagia, unspecified: Secondary | ICD-10-CM | POA: Diagnosis not present

## 2019-07-26 DIAGNOSIS — R140 Abdominal distension (gaseous): Secondary | ICD-10-CM | POA: Diagnosis not present

## 2019-07-26 DIAGNOSIS — K219 Gastro-esophageal reflux disease without esophagitis: Secondary | ICD-10-CM | POA: Diagnosis not present

## 2019-08-14 ENCOUNTER — Telehealth: Payer: Self-pay

## 2019-08-15 ENCOUNTER — Encounter: Payer: Self-pay | Admitting: Nurse Practitioner

## 2019-08-16 ENCOUNTER — Telehealth: Payer: Self-pay

## 2019-08-16 ENCOUNTER — Ambulatory Visit: Payer: Self-pay

## 2019-08-16 ENCOUNTER — Other Ambulatory Visit: Payer: Self-pay

## 2019-08-16 DIAGNOSIS — I10 Essential (primary) hypertension: Secondary | ICD-10-CM

## 2019-08-16 DIAGNOSIS — E785 Hyperlipidemia, unspecified: Secondary | ICD-10-CM

## 2019-08-16 DIAGNOSIS — K2100 Gastro-esophageal reflux disease with esophagitis, without bleeding: Secondary | ICD-10-CM

## 2019-08-16 NOTE — Chronic Care Management (AMB) (Signed)
  Chronic Care Management   Outreach Note  08/16/2019 Name: Wendy Mosley MRN: [id number] DOB: Jul 07, 1950  Referred by: [name], FNP Reason for referral : Chronic Care Management (FU RN CM Inbound Call from patient )   An unsuccessful telephone outreach was attempted today. The patient was referred to the case management team for assistance with care management and care coordination.   Follow Up Plan: Telephone follow up appointment with care management team member scheduled for: 09/19/19  Barb Merino, RN, BSN, CCM Care Management Coordinator [place] [facility]  Direct Phone: [phone]

## 2019-09-05 DIAGNOSIS — H0288A Meibomian gland dysfunction right eye, upper and lower eyelids: Secondary | ICD-10-CM | POA: Diagnosis not present

## 2019-09-05 DIAGNOSIS — H0288B Meibomian gland dysfunction left eye, upper and lower eyelids: Secondary | ICD-10-CM | POA: Diagnosis not present

## 2019-09-05 DIAGNOSIS — H04123 Dry eye syndrome of bilateral lacrimal glands: Secondary | ICD-10-CM | POA: Diagnosis not present

## 2019-09-05 DIAGNOSIS — H1045 Other chronic allergic conjunctivitis: Secondary | ICD-10-CM | POA: Diagnosis not present

## 2019-09-12 LAB — HM COLONOSCOPY

## 2019-09-13 ENCOUNTER — Encounter: Payer: Self-pay | Admitting: Nurse Practitioner

## 2019-09-14 ENCOUNTER — Other Ambulatory Visit: Payer: Self-pay | Admitting: Nurse Practitioner

## 2019-09-14 DIAGNOSIS — L309 Dermatitis, unspecified: Secondary | ICD-10-CM

## 2019-09-14 MED ORDER — CLOBETASOL PROPIONATE 0.05 % EX CREA: TOPICAL_CREAM | CUTANEOUS | 0 refills | Status: DC

## 2019-09-15 ENCOUNTER — Encounter: Payer: Self-pay | Admitting: Nurse Practitioner

## 2019-09-19 ENCOUNTER — Telehealth: Payer: Self-pay

## 2019-09-21 ENCOUNTER — Ambulatory Visit: Payer: Medicare HMO

## 2019-09-21 ENCOUNTER — Encounter: Payer: Medicare HMO | Admitting: Internal Medicine

## 2019-09-21 ENCOUNTER — Encounter: Payer: Medicare HMO | Admitting: Nurse Practitioner

## 2019-09-21 ENCOUNTER — Other Ambulatory Visit: Payer: Self-pay

## 2019-10-09 ENCOUNTER — Telehealth: Payer: Self-pay

## 2019-10-11 ENCOUNTER — Telehealth: Payer: Self-pay

## 2019-10-11 ENCOUNTER — Other Ambulatory Visit: Payer: Self-pay

## 2019-10-11 DIAGNOSIS — L309 Dermatitis, unspecified: Secondary | ICD-10-CM

## 2019-10-11 DIAGNOSIS — R1310 Dysphagia, unspecified: Secondary | ICD-10-CM | POA: Diagnosis not present

## 2019-10-11 DIAGNOSIS — K219 Gastro-esophageal reflux disease without esophagitis: Secondary | ICD-10-CM | POA: Diagnosis not present

## 2019-10-11 DIAGNOSIS — R140 Abdominal distension (gaseous): Secondary | ICD-10-CM | POA: Diagnosis not present

## 2019-10-11 MED ORDER — TRIAMCINOLONE ACETONIDE 0.5 % EX CREA: 1.0000 "application " | TOPICAL_CREAM | Freq: Two times a day (BID) | CUTANEOUS | 0 refills | Status: DC

## 2019-10-11 NOTE — Telephone Encounter (Signed)
PT NEEDS REFILL OF TRIAMCINOLONE CREAM SENT TO PHARMACY

## 2019-10-11 NOTE — Telephone Encounter (Signed)
Refill sent [facility]

## 2019-10-15 ENCOUNTER — Other Ambulatory Visit: Payer: Self-pay | Admitting: Nurse Practitioner

## 2019-10-15 DIAGNOSIS — L309 Dermatitis, unspecified: Secondary | ICD-10-CM

## 2019-10-25 ENCOUNTER — Encounter: Payer: Self-pay | Admitting: Nurse Practitioner

## 2019-10-25 ENCOUNTER — Other Ambulatory Visit: Payer: Self-pay

## 2019-10-25 ENCOUNTER — Ambulatory Visit (INDEPENDENT_AMBULATORY_CARE_PROVIDER_SITE_OTHER): Payer: Medicare HMO | Admitting: Nurse Practitioner

## 2019-10-25 ENCOUNTER — Ambulatory Visit (INDEPENDENT_AMBULATORY_CARE_PROVIDER_SITE_OTHER): Payer: Medicare HMO

## 2019-10-25 VITALS — BP 132/80 | HR 78 | Temp 97.8°F | Ht 63.8 in | Wt 167.2 lb

## 2019-10-25 VITALS — BP 132/80 | HR 78 | Temp 97.8°F | Ht 63.8 in | Wt 167.0 lb

## 2019-10-25 DIAGNOSIS — Z01419 Encounter for gynecological examination (general) (routine) without abnormal findings: Secondary | ICD-10-CM | POA: Diagnosis not present

## 2019-10-25 DIAGNOSIS — E785 Hyperlipidemia, unspecified: Secondary | ICD-10-CM

## 2019-10-25 DIAGNOSIS — Z0000 Encounter for general adult medical examination without abnormal findings: Secondary | ICD-10-CM

## 2019-10-25 DIAGNOSIS — I10 Essential (primary) hypertension: Secondary | ICD-10-CM | POA: Diagnosis not present

## 2019-10-25 DIAGNOSIS — E782 Mixed hyperlipidemia: Secondary | ICD-10-CM

## 2019-10-25 DIAGNOSIS — Z1231 Encounter for screening mammogram for malignant neoplasm of breast: Secondary | ICD-10-CM | POA: Diagnosis not present

## 2019-10-25 DIAGNOSIS — E2839 Other primary ovarian failure: Secondary | ICD-10-CM | POA: Diagnosis not present

## 2019-10-25 DIAGNOSIS — I252 Old myocardial infarction: Secondary | ICD-10-CM | POA: Diagnosis not present

## 2019-10-25 DIAGNOSIS — R000 Tachycardia, unspecified: Secondary | ICD-10-CM

## 2019-10-25 DIAGNOSIS — Z23 Encounter for immunization: Secondary | ICD-10-CM

## 2019-10-25 LAB — POCT URINALYSIS DIPSTICK
Bilirubin, UA: NEGATIVE
Blood, UA: NEGATIVE
Glucose, UA: NEGATIVE
Ketones, UA: NEGATIVE
Leukocytes, UA: NEGATIVE
Nitrite, UA: NEGATIVE
Protein, UA: POSITIVE — AB
Spec Grav, UA: >=1.03 — AB (ref 1.010–1.025)
Urobilinogen, UA: 0.2 E.U./dL
pH, UA: 5.5 (ref 5.0–8.0)

## 2019-10-25 LAB — CBC
Hematocrit: 40 % (ref 34.0–46.6)
Hemoglobin: 12.1 g/dL (ref 11.1–15.9)
MCH: 22.5 pg — ABNORMAL LOW (ref 26.6–33.0)
MCHC: 30.3 g/dL — ABNORMAL LOW (ref 31.5–35.7)
MCV: 75 fL — ABNORMAL LOW (ref 79–97)
Platelets: 348 10*3/uL (ref 150–450)
RBC: 5.37 x10E6/uL — ABNORMAL HIGH (ref 3.77–5.28)
RDW: 15.8 % — ABNORMAL HIGH (ref 11.7–15.4)
WBC: 6.3 10*3/uL (ref 3.4–10.8)

## 2019-10-25 LAB — CMP14+EGFR
ALT: 16 IU/L (ref 0–32)
AST: 18 IU/L (ref 0–40)
Albumin/Globulin Ratio: 1.6 (ref 1.2–2.2)
Albumin: 4.4 g/dL (ref 3.8–4.8)
Alkaline Phosphatase: 143 IU/L — ABNORMAL HIGH (ref 48–121)
BUN/Creatinine Ratio: 11 — ABNORMAL LOW (ref 12–28)
BUN: 11 mg/dL (ref 8–27)
Bilirubin Total: 0.6 mg/dL (ref 0.0–1.2)
CO2: 23 mmol/L (ref 20–29)
Calcium: 9.5 mg/dL (ref 8.7–10.3)
Chloride: 104 mmol/L (ref 96–106)
Creatinine, Ser: 0.99 mg/dL (ref 0.57–1.00)
GFR calc Af Amer: 67 mL/min/{1.73_m2} (ref >59)
GFR calc non Af Amer: 58 mL/min/{1.73_m2} — ABNORMAL LOW (ref >59)
Globulin, Total: 2.7 g/dL (ref 1.5–4.5)
Glucose: 90 mg/dL (ref 65–99)
Potassium: 4.3 mmol/L (ref 3.5–5.2)
Sodium: 141 mmol/L (ref 134–144)
Total Protein: 7.1 g/dL (ref 6.0–8.5)

## 2019-10-25 LAB — POCT UA - MICROALBUMIN
Albumin/Creatinine Ratio, Urine, POC: 30
Creatinine, POC: 300 mg/dL
Microalbumin Ur, POC: 80 mg/L

## 2019-10-25 LAB — TSH: TSH: 1.46 u[IU]/mL (ref 0.450–4.500)

## 2019-10-25 MED ORDER — NYSTATIN 100000 UNIT/GM EX POWD: 1.0000 "application " | Freq: Three times a day (TID) | CUTANEOUS | 0 refills | Status: DC

## 2019-10-25 NOTE — Patient Instructions (Signed)
Health Maintenance, Female Adopting a healthy lifestyle and getting preventive care are important in promoting health and wellness. Ask your health care provider about:  The right schedule for you to have regular tests and exams.  Things you can do on your own to prevent diseases and keep yourself healthy. What should I know about diet, weight, and exercise? Eat a healthy diet   Eat a diet that includes plenty of vegetables, fruits, low-fat dairy products, and lean protein.  Do not eat a lot of foods that are high in solid fats, added sugars, or sodium. Maintain a healthy weight Body mass index (BMI) is used to identify weight problems. It estimates body fat based on height and weight. Your health care provider can help determine your BMI and help you achieve or maintain a healthy weight. Get regular exercise Get regular exercise. This is one of the most important things you can do for your health. Most adults should:  Exercise for at least 150 minutes each week. The exercise should increase your heart rate and make you sweat (moderate-intensity exercise).  Do strengthening exercises at least twice a week. This is in addition to the moderate-intensity exercise.  Spend less time sitting. Even light physical activity can be beneficial. Watch cholesterol and blood lipids Have your blood tested for lipids and cholesterol at 69 years of age, then have this test every 5 years. Have your cholesterol levels checked more often if:  Your lipid or cholesterol levels are high.  You are older than 69 years of age.  You are at high risk for heart disease. What should I know about cancer screening? Depending on your health history and family history, you may need to have cancer screening at various ages. This may include screening for:  Breast cancer.  Cervical cancer.  Colorectal cancer.  Skin cancer.  Lung cancer. What should I know about heart disease, diabetes, and high blood  pressure? Blood pressure and heart disease  High blood pressure causes heart disease and increases the risk of stroke. This is more likely to develop in people who have high blood pressure readings, are of African descent, or are overweight.  Have your blood pressure checked: ? Every 3-5 years if you are 18-39 years of age. ? Every year if you are 40 years old or older. Diabetes Have regular diabetes screenings. This checks your fasting blood sugar level. Have the screening done:  Once every three years after age 40 if you are at a normal weight and have a low risk for diabetes.  More often and at a younger age if you are overweight or have a high risk for diabetes. What should I know about preventing infection? Hepatitis B If you have a higher risk for hepatitis B, you should be screened for this virus. Talk with your health care provider to find out if you are at risk for hepatitis B infection. Hepatitis C Testing is recommended for:  Everyone born from 1945 through 1965.  Anyone with known risk factors for hepatitis C. Sexually transmitted infections (STIs)  Get screened for STIs, including gonorrhea and chlamydia, if: ? You are sexually active and are younger than 69 years of age. ? You are older than 69 years of age and your health care provider tells you that you are at risk for this type of infection. ? Your sexual activity has changed since you were last screened, and you are at increased risk for chlamydia or gonorrhea. Ask your health care provider if   you are at risk.  Ask your health care provider about whether you are at high risk for HIV. Your health care provider may recommend a prescription medicine to help prevent HIV infection. If you choose to take medicine to prevent HIV, you should first get tested for HIV. You should then be tested every 3 months for as long as you are taking the medicine. Pregnancy  If you are about to stop having your period (premenopausal) and  you may become pregnant, seek counseling before you get pregnant.  Take 400 to 800 micrograms (mcg) of folic acid every day if you become pregnant.  Ask for birth control (contraception) if you want to prevent pregnancy. Osteoporosis and menopause Osteoporosis is a disease in which the bones lose minerals and strength with aging. This can result in bone fractures. If you are 65 years old or older, or if you are at risk for osteoporosis and fractures, ask your health care provider if you should:  Be screened for bone loss.  Take a calcium or vitamin D supplement to lower your risk of fractures.  Be given hormone replacement therapy (HRT) to treat symptoms of menopause. Follow these instructions at home: Lifestyle  Do not use any products that contain nicotine or tobacco, such as cigarettes, e-cigarettes, and chewing tobacco. If you need help quitting, ask your health care provider.  Do not use street drugs.  Do not share needles.  Ask your health care provider for help if you need support or information about quitting drugs. Alcohol use  Do not drink alcohol if: ? Your health care provider tells you not to drink. ? You are pregnant, may be pregnant, or are planning to become pregnant.  If you drink alcohol: ? Limit how much you use to 0-1 drink a day. ? Limit intake if you are breastfeeding.  Be aware of how much alcohol is in your drink. In the [place] one drink equals one 12 oz bottle of beer (355 mL), one 5 oz glass of wine (148 mL), or one 1 oz glass of hard liquor (44 mL). General instructions  Schedule regular health, dental, and eye exams.  Stay current with your vaccines.  Tell your health care provider if: ? You often feel depressed. ? You have ever been abused or do not feel safe at home. Summary  Adopting a healthy lifestyle and getting preventive care are important in promoting health and wellness.  Follow your health care provider's instructions about healthy  diet, exercising, and getting tested or screened for diseases.  Follow your health care provider's instructions on monitoring your cholesterol and blood pressure. This information is not intended to replace advice given to you by your health care provider. Make sure you discuss any questions you have with your health care provider. Document Revised: 02/09/2018 Document Reviewed: 02/09/2018 Elsevier Patient Education  2020 [employer]  

## 2019-10-25 NOTE — Progress Notes (Signed)
I,Yamilka Roman [employer] as a Education administrator for [employer], FNP.,have documented all relevant documentation on the behalf of [name], FNP,as directed by  [name], FNP while in the presence of [name], [place].  This visit occurred during the SARS-CoV-2 public health emergency.  Safety protocols were in place, including screening questions prior to the visit, additional usage of staff PPE, and extensive cleaning of exam room while observing appropriate contact time as indicated for disinfecting solutions.  Subjective:     Patient ID: Wendy Mosley , female    DOB: 1950-03-11 , 69 y.o.   MRN: [id number]   Chief Complaint  Patient presents with  . Annual Exam    HPI  Here for her HM and her AWV by the Medicare Nurse.    Hypertension This is a chronic problem. The current episode started more than 1 year ago. The problem is unchanged. The problem is controlled. Pertinent negatives include no anxiety, chest pain, headaches or palpitations. There are no associated agents to hypertension. Risk factors for coronary artery disease include sedentary lifestyle. Past treatments include nothing. Compliance problems include exercise and diet.  There is no history of chronic renal disease.     Past Medical History:  Diagnosis Date  . Cataracts, bilateral    right eye - just watching  . Diverticulitis   . GERD (gastroesophageal reflux disease) 2006  . Hearing loss    right ear - no hearing aid  . Heart attack ([place]) 2008  . Hypertension   . Irregular heart beat 1973  . SVD (spontaneous vaginal delivery)    x 3     Family History  Problem Relation Age of Onset  . Asthma Mother   . Heart disease Mother   . Stroke Mother   . Diabetes Sister   . Cancer Maternal Grandmother        Lukemia  . Breast cancer Neg Hx      Current Outpatient Medications:  .  aspirin 81 MG tablet, Take 81 mg by mouth every other day. , Disp: , Rfl:  .  B COMPLEX-ZINC PO, Take 1 tablet by mouth  daily., Disp: , Rfl:  .  clobetasol cream (TEMOVATE) 0.05 %, APPLY TO AFFECTED AREA TWICE A DAY AS NEEDED, Disp: 30 g, Rfl: 0 .  dexlansoprazole (DEXILANT) 60 MG capsule, Take 1 capsule (60 mg total) by mouth daily., Disp: 30 capsule, Rfl: 2 .  ibuprofen (ADVIL,MOTRIN) 200 MG tablet, Take 400-600 mg by mouth every 6 (six) hours as needed for moderate pain., Disp: , Rfl:  .  triamcinolone cream (KENALOG) 0.5 %, Apply 1 application topically 2 (two) times daily. Apply to rash on  RT arm and side, Disp: 60 g, Rfl: 0 .  nystatin (NYSTATIN) powder, Apply 1 application topically 3 (three) times daily., Disp: 15 g, Rfl: 0   Allergies  Allergen Reactions  . Sulfonamide Derivatives     Rash/itching     The patient states she is post menopausal status. Negative for Dysmenorrhea and Negative for Menorrhagia. Negative for: breast discharge, breast lump(s), breast pain and breast self exam. Associated symptoms include abnormal vaginal bleeding. Pertinent negatives include abnormal bleeding (hematology), anxiety, decreased libido, depression, difficulty falling sleep, dyspareunia, history of infertility, nocturia, sexual dysfunction, sleep disturbances, urinary incontinence, urinary urgency, vaginal discharge and vaginal itching. Diet regular.The patient states her exercise level is none, "I may walk every  Now and then".  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     The patient's tobacco use is:  Social History   Tobacco Use  Smoking Status Former Smoker  . Packs/day: 1.00  . Years: 30.00  . Pack years: 30.00  . Types: Cigarettes  . Quit date: 02/01/2011  . Years since quitting: 8.7  Smokeless Tobacco Never Used   She has been exposed to passive smoke. The patient's alcohol use is:  Social History   Substance and Sexual Activity  Alcohol Use Not Currently   Comment: occasional glass of wine     Review of Systems   Constitutional: Negative.   HENT: Negative.   Eyes: Negative.   Respiratory: Negative.   Cardiovascular: Negative.  Negative for chest pain, palpitations and leg swelling.  Gastrointestinal: Negative.   Endocrine: Negative.   Genitourinary: Negative.   Musculoskeletal: Negative.   Skin: Negative.   Allergic/Immunologic: Negative.   Neurological: Negative.  Negative for headaches.  Hematological: Negative.   Psychiatric/Behavioral: Negative.      Today's Vitals   10/25/19 0932  BP: 132/80  Pulse: 78  Temp: 97.8 F (36.6 C)  TempSrc: Oral  Weight: 167 lb 3.2 oz (75.8 kg)  Height: 5' 3.8" (1.621 m)   Body mass index is 28.88 kg/m.   Objective:  Physical Exam Vitals reviewed.  Constitutional:      General: She is not in acute distress.    Appearance: Normal appearance. She is well-developed. She is obese.  HENT:     Head: Normocephalic and atraumatic.     Right Ear: Hearing, tympanic membrane, ear canal and external ear normal. There is no impacted cerumen.     Left Ear: Hearing, tympanic membrane, ear canal and external ear normal. There is no impacted cerumen.     Nose:     Comments: Deferred - masked    Mouth/Throat:     Comments: Deferred - masked Eyes:     General: Lids are normal.     Extraocular Movements: Extraocular movements intact.     Conjunctiva/sclera: Conjunctivae normal.     Pupils: Pupils are equal, round, and reactive to light.     Funduscopic exam:    Right eye: No papilledema.        Left eye: No papilledema.  Neck:     Thyroid: No thyroid mass.     Vascular: No carotid bruit.  Cardiovascular:     Rate and Rhythm: Normal rate and regular rhythm.     Pulses: Normal pulses.     Heart sounds: Normal heart sounds. No murmur heard.   Pulmonary:     Effort: Pulmonary effort is normal.     Breath sounds: Normal breath sounds.  Abdominal:     General: Abdomen is flat. Bowel sounds are normal. There is no distension.     Palpations: Abdomen is  soft.     Tenderness: There is no abdominal tenderness.  Genitourinary:    Rectum: Guaiac result negative.  Musculoskeletal:        General: No swelling. Normal range of motion.     Cervical back: Full passive range of motion without pain, normal range of motion and neck supple.     Right lower leg: No edema.     Left lower leg: No edema.  Skin:    General: Skin is warm and dry.     Capillary Refill: Capillary refill takes less than 2 seconds.  Neurological:     General: No focal deficit present.  Mental Status: She is alert and oriented to person, place, and time.     Cranial Nerves: No cranial nerve deficit.     Sensory: No sensory deficit.  Psychiatric:        Mood and Affect: Mood normal.        Behavior: Behavior normal.        Thought Content: Thought content normal.        Judgment: Judgment normal.         Assessment And Plan:     1. Encounter for general adult medical examination w/o abnormal findings . Behavior modifications discussed and diet history reviewed.   . Pt will continue to exercise regularly and modify diet with low GI, plant based foods and decrease intake of processed foods.  . Recommend intake of daily multivitamin, Vitamin D, and calcium.  . Recommend mammogram and colonoscopy for preventive screenings, as well as recommend immunizations that include influenza, TDAP - CBC  2. Encounter for screening mammogram for malignant neoplasm of breast  Pt instructed on Self Breast Exam.According to ACOG guidelines Women aged 24 and older are recommended to get an annual mammogram. Form completed and given to patient contact the [facility] for appointment scheduing.   Pt encouraged to get annual mammogram - MM DIGITAL SCREENING BILATERAL; Future  3. Hyperlipidemia, unspecified hyperlipidemia type  Chronic, controlled  No current medications, will see what results are she may need medications - CMP14+EGFR - Lipid panel - Ambulatory referral to  Cardiology  4. Essential hypertension . B/P is controlled.  . CMP ordered to check renal function.  . The importance of regular exercise and dietary modification was stressed to the patient.  . EKG done with NSR - POCT Urinalysis Dipstick (81002) - POCT UA - Microalbumin - EKG 12-Lead  5. Decreased estrogen level - DG Bone Density; Future  6. Encounter for gynecological examination  She would like routine gyn exam - Ambulatory referral to Gynecology  7. Racing heart beat  EKG NSR   Advised to avoid caffeine and to stay well hydrated with water - TSH  8. History of MI (myocardial infarction)  Previous history of MI will refer to cardiology since she is having feelings of her heart racing. - Ambulatory referral to Cardiology   Patient was given opportunity to ask questions. Patient verbalized understanding of the plan and was able to repeat key elements of the plan. All questions were answered to their satisfaction.   [name], FNP   I, [name], FNP, have reviewed all documentation for this visit. The documentation on 10/27/19 for the exam, diagnosis, procedures, and orders are all accurate and complete.  THE PATIENT IS ENCOURAGED TO PRACTICE SOCIAL DISTANCING DUE TO THE COVID-19 PANDEMIC.

## 2019-10-25 NOTE — Progress Notes (Signed)
This visit occurred during the SARS-CoV-2 public health emergency.  Safety protocols were in place, including screening questions prior to the visit, additional usage of staff PPE, and extensive cleaning of exam room while observing appropriate contact time as indicated for disinfecting solutions.  Subjective:   Wendy Mosley is a 69 y.o. female who presents for Medicare Annual (Subsequent) preventive examination.  Review of Systems     Cardiac Risk Factors include: advanced age (>40mn, >>80women);hypertension;dyslipidemia;sedentary lifestyle     Objective:    Today's Vitals   10/25/19 1011  BP: 132/80  Pulse: 78  Temp: 97.8 F (36.6 C)  TempSrc: Oral  Weight: 167 lb (75.8 kg)  Height: 5' 3.8" (1.621 m)   Body mass index is 28.85 kg/m.  Advanced Directives 10/25/2019 09/15/2018 05/27/2018 02/14/2018 12/14/2017 12/24/2016 09/08/2016  Does Patient Have a Medical Advance Directive? _0  No No  Would patient like information on creating a medical advance directive? No - Patient declined No - Patient declined Yes (MAU/Ambulatory/Procedural Areas - Information given) No - Patient declined - No - Patient declined -    Current Medications (verified) Outpatient Encounter Medications as of 10/25/2019  Medication Sig  . aspirin 81 MG tablet Take 81 mg by mouth every other day.   . B COMPLEX-ZINC PO Take 1 tablet by mouth daily.  . clobetasol cream (TEMOVATE) 0.05 % APPLY TO AFFECTED AREA TWICE A DAY AS NEEDED  . dexlansoprazole (DEXILANT) 60 MG capsule Take 1 capsule (60 mg total) by mouth daily.  .Marland Kitchenibuprofen (ADVIL,MOTRIN) 200 MG tablet Take 400-600 mg by mouth every 6 (six) hours as needed for moderate pain.  .Marland Kitchentriamcinolone cream (KENALOG) 0.5 % Apply 1 application topically 2 (two) times daily. Apply to rash on  RT arm and side   No facility-administered encounter medications on file as of 10/25/2019.    Allergies (verified) Sulfonamide derivatives   History: Past  Medical History:  Diagnosis Date  . Cataracts, bilateral    right eye - just watching  . Diverticulitis   . GERD (gastroesophageal reflux disease) 2006  . Hearing loss    right ear - no hearing aid  [place]  . Hypertension   . Irregular heart beat 1973  . SVD (spontaneous vaginal delivery)    x 3   Past Surgical History:  Procedure Laterality Date  . APPENDECTOMY  1967  . BREAST SURGERY     cyst removed from right breast at age 69 . CHOLECYSTECTOMY  1975  . COLONOSCOPY    . DILATATION & CURETTAGE/HYSTEROSCOPY WITH MYOSURE N/A 02/21/2018   Procedure: DILATATION & CURETTAGE/HYSTEROSCOPY WITH MYOSURE RESECTION OF ENDOMETRIAL POLYP;  Surgeon: CServando Salina MD;  [facility];  Service: Gynecology;  Laterality: N/A;  . EXTERNAL EAR SURGERY  1974  . TUBAL LIGATION  1977   Family History  Problem Relation Age of Onset  . Asthma Mother   . Heart disease Mother   . Stroke Mother   . Diabetes Sister   . Cancer Maternal Grandmother        Lukemia  . Breast cancer Neg Hx    Social History   Socioeconomic History  . Marital status: Married    Spouse name: Not on file  . Number of children: Not on file  . Years of education: Not on file  . Highest education level: Not on file  Occupational History  . Not on file  Tobacco Use  . Smoking status: Former  Smoker    Packs/day: 1.00    Years: 30.00    Pack years: 30.00    Types: Cigarettes    Quit date: 02/01/2011    Years since quitting: 8.7  . Smokeless tobacco: Never Used  Vaping Use  . Vaping Use: Never used  Substance and Sexual Activity  . Alcohol use: Not Currently    Comment: occasional glass of wine  . Drug use: No    Comment: No IV drug  . Sexual activity: Not Currently    Birth control/protection: Post-menopausal  Other Topics Concern  . 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 Difficulty of Paying Living Expenses: Not hard at all  Food Insecurity: No Food Insecurity  . Worried About Charity fundraiser in the Last Year: Never true  . Ran Out of Food in the Last Year: Never true  Transportation Needs: No Transportation Needs  . Lack of Transportation (Medical): No  . Lack of Transportation (Non-Medical): No  Physical Activity: Inactive  . Days of Exercise per Week: 0 days  . Minutes of Exercise per Session: 0 min  Stress: No Stress Concern Present  . Feeling of Stress : Not at all  Social Connections:   . Frequency of Communication with Friends and Family: Not on file  . Frequency of Social Gatherings with Friends and Family: Not on file  . Attends Religious Services: Not on file  . Active Member of Clubs or Organizations: Not on file  . Attends Archivist Meetings: Not on file  . Marital Status: Not on file    Tobacco Counseling Counseling given: Not Answered   Clinical Intake:  Pre-visit preparation completed: Yes  Pain : No/denies pain     Nutritional Status: BMI 25 -29 Overweight Nutritional Risks: None Diabetes: No  How often do you need to have someone help you when you read instructions, pamphlets, or other written materials from your doctor or pharmacy?: 1 - Never What is the last grade level you completed in school?: some college  Diabetic? no  Interpreter Needed?: No  Information entered by :: NAllen LPN   Activities of Daily Living In your present state of health, do you have any difficulty performing the following activities: 10/25/2019 10/25/2019  Hearing? N N  Vision? Y N  Comment some cloudiness with right eye -  Difficulty concentrating or making decisions? N N  Walking or climbing stairs? N N  Dressing or bathing? N N  Doing errands, shopping? N N  Preparing Food and eating ? N -  Using the Toilet? N -  In the past six  months, have you accidently leaked urine? N -  Do you have problems with loss of bowel control? N -  Managing your Medications? N -  Managing your Finances? N -  Housekeeping or managing your Housekeeping? N -  Some recent data might be hidden    Patient Care Team: [name], FNP as PCP - General ([place]) Daneen Schick as Social Worker [place], Claudette Stapler, RN as Case Manager  Indicate any recent Medical Services you may have received from other than [facility] providers in the past year (date may be approximate).     Assessment:   This is  a routine wellness examination for Wendy Mosley.  Hearing/Vision screen  Hearing Screening   _0  _1  _2  _3  _4  _5  _6  _7  _8   Right ear:           Left ear:           Vision Screening Comments: Regular eye exams, Dr. Katy Fitch  Dietary issues and exercise activities discussed: Current Exercise Habits: The patient does not participate in regular exercise at present  Goals    .  "I don't want to take a prescription drug for my cholesterol" (pt-stated)      Current Barriers:  [name] Knowledge Deficits related to treatment management for Hyperlipidemia  . Fear of taking prescription drugs to treat hyperlipidemia  Nurse Case Manager Clinical Goal(s):  [name] Over the next 60 days, patient will work with the CCM team to address needs related to Hyperlipidemia  CCM SW Interventions: Completed 04/06/19 . Outbound call to the patient to assist with care coordination needs . Performed chart review to note patient seen by [name], FNP on 03/28/19 o During recent OV patient self reported decision to stop Atorvastatin due to side effects and itching o Assessed for patient plan to try a different medication. Patient reported she does not desire to take medication if possible due to possible side effects . Discussed patient plans to self manager Hyperlipidemia o Patient reports increased intake of fruit, vegetables, poultry, and  fish o Patient reports decreasing intake of pork and beef o Patient states she has switched from white bread to wheat bread . Encouraged the patient to continue managing cholesterol with diet and to contact the care management team with future care coordination needs . Collaboration with RN Case Manager regarding patient self health management of Hyperlipidemia  CCM RN CM Interventions:  10/12/18 call completed with patient   . Evaluation of current treatment plan related to Hyperlipidemia and patient's adherence to plan as established by provider. . Provided education to patient re: importance of lowering Cholesterol to help reduce the risk of heart attack and stroke . Reviewed medications with patient and discussed that patient has experienced SE to recent statin prescribed as well as past medications tried for this condition; discussed patients wishes to treat this condition with natural remedies, exercise and diet; discussed having the embedded Pharm D Lottie Dawson contact her to discuss best treatment options with least SE- pt agreed  . Collaborated with embedded Pharm D Lottie Dawson regarding outreach to patient to discuss best treatment options for treatment management of Hyperlipidemia with least SE . Discussed plans with patient for ongoing care management follow up and provided patient with direct contact information for care management team . Provided patient with printed educational materials related to disease process and treatment options for Hyperlipidemia  Patient Self Care Activities:  . Self administers medications as prescribed . Attends all scheduled provider appointments . Calls pharmacy for medication refills . Attends church or other social activities . Performs ADL's independently . Performs IADL's independently . Calls provider office for new concerns or questions  Please see past updates related to this goal by clicking on the "Past Updates" button in the selected  goal      .  "I have pressure and intermittent pain in my lower abdomen" (pt-stated)      Current Barriers:  [name] Knowledge Deficits related to etiology for lower abdominal pain  Chronic Disease Management support and education needs related to HTN, HLD, GERD, Epigastric Pain, H Pylori  Nurse Case Manager Clinical Goal(s):  .  Over the next 60 days, patient will work with Designer, fashion/clothing  to address needs related to unresolved uterine pain  -Goal Not Met . 10/14/18 Over the next 90 days, patient will follow up with her Gynecologist to address needs related to unresolved uterine pain -Goal Not Met . New 05/31/2019- Over the next 60 days the patient will work with care team to develop a plan to address chronic abdominal pain   CCM RN CM Interventions:  06/27/19 call completed with patient  . Evaluation of current treatment plan related to unresolved abdominal pain and patient's adherence to plan as established by provider . Discussed patient completed an OV with PCP provider [name], FNP for further evaluation of her lower abdominal pain . Determined [name] provided patient with Dexilant samples in which Mrs. Brester is getting good effectiveness, states, "my pain gets better when I take the medicine" . Discussed PCP sent new referral for GI consult with Dr. Collene Mares to further evaluate GI symptoms  . Reiterated having patient contact her GYN office to schedule a f/u visit to further evaluate/treat Endometriosis  . Discussed plans with patient for ongoing care management follow up and provided patient with direct contact information for care management team  Patient Self Care Activities:  . Self administers medications as prescribed . Attends all scheduled provider appointments . Calls pharmacy for medication refills . Attends church or other social activities . Performs ADL's independently . Performs IADL's independently . Calls provider office for new concerns or questions  Please  see past updates related to this goal by clicking on the "Past Updates" button in the selected goal       .  "I know I need to start exercising again" (pt-stated)      Current Barriers:  [name] Knowledge Deficits related to health benefits associated with physical exercise  Nurse Case Manager Clinical Goal(s):  [name] Over the next 60 days, patient will verbalize having a plan for implementing a structured home exercise plan. . 07/18/18-Goal date revised to 60 days due to health related delays secondary to COVID-19  Interventions:   Completed CCM RN Telephone Follow Up with patient  . Evaluation of current treatment plan related to home exercise plan and patient's adherence to plan as established by provider . Reinforced to start off slowly and increase activity/exercise as tolerated - pt is walking outside her home with granddaughter occasionally "going down the driveway and back" . Reinforced health benefits from having a consistent routine home exercise regimen to help with cardio health and build stamina  . Mailed printed patient ed mats related to healthy eating and benefits of exercise . Discussed plans with patient for ongoing care management follow up and provided patient with direct contact information for care management team . Scheduled a follow up call with patient for about 2-3 weeks  Patient Self Care Activities:  . Self administers medications as prescribed . Attends all scheduled provider appointments . Calls pharmacy for medication refills . Attends church or other social activities . Performs ADL's independently . Performs IADL's independently . Calls provider office for new concerns or questions  Please see past updates related to this goal by clicking on the "Past Updates" button in the selected goal       .  "I need to get established with a new Eye doctor" (pt-stated)      [place] (see longitudinal plan of care for additional care plan information)  Current  Barriers:  . Cataract to right eye .  Does not have Eye Specialist  . Chronic Disease Management support and education needs related to HTN, HLD, GERD  Nurse Case Manager Clinical Goal(s):  [name] Over the next 30 days, patient will work with CCM RN and PCP to address needs related to evaluation and treatment of Cataract to right eye  CCM RN CM Interventions:  06/27/19 call completed with patient  . Inter-disciplinary care team collaboration (see longitudinal plan of care) . Evaluation of current treatment plan related to Cataract to right eye and patient's adherence to plan as established by provider. Nash Dimmer with PCP [name], FNP via in basket message  regarding referral needed for Eye Specialist . Discussed plans with patient for ongoing care management follow up and provided patient with direct contact information for care management team  Patient Self Care Activities:  . Self administers medications as prescribed . Attends all scheduled provider appointments . Calls pharmacy for medication refills . Performs ADL's independently . Performs IADL's independently . Calls provider office for new concerns or questions  Initial goal documentation     .  Patient Stated      10/25/2019, wants to be healthier, get cataract taken care of, get teeth taken care of    .  Weight (lb) < 200 lb (90.7 kg)      Wants to weigh 150 pounds. Eat healthier more vegetables. Start an exercise regimen.      Depression Screen PHQ 2/9 Scores 10/25/2019 03/28/2019 09/15/2018 07/26/2018 05/27/2018 03/29/2017 12/24/2016  PHQ - 2 Score 0 0 0 0 0 0 0  PHQ- 9 Score 0 - 0 - - 0 -    Fall Risk Fall Risk  10/25/2019 03/28/2019 09/15/2018 07/26/2018 06/07/2018  Falls in the past year? 0 0 0 0 0  Risk for fall due to : No Fall Risks - Medication side effect - -  Follow up Falls evaluation completed;Education provided;Falls prevention discussed - Falls evaluation completed;Education provided;Falls prevention  discussed - -    Any stairs in or around the home? No  If so, are there any without handrails? n/a Home free of loose throw rugs in walkways, pet beds, electrical cords, etc? Yes  Adequate lighting in your home to reduce risk of falls? Yes   ASSISTIVE DEVICES UTILIZED TO PREVENT FALLS:  Life alert? No  Use of a cane, walker or w/c? No  Grab bars in the bathroom? No  Shower chair or bench in shower? No  Elevated toilet seat or a handicapped toilet? Yes   TIMED UP AND GO:  Was the test performed? No . .   Gait steady and fast without use of assistive device  Cognitive Function:     6CIT Screen 10/25/2019 09/15/2018  What Year? 0 points 0 points  What month? 0 points 0 points  What time? 0 points 0 points  Count back from 20 0 points 2 points  Months in reverse 0 points 0 points  Repeat phrase 0 points 0 points  Total Score 0 2    Immunizations Immunization History  Administered Date(s) Administered  . Influenza,inj,Quad PF,6+ Mos 11/12/2015, 03/29/2017  . Influenza-Unspecified 02/02/2019  . PFIZER SARS-COV-2 Vaccination 05/16/2019, 06/06/2019  . Pneumococcal Conjugate-13 03/29/2017  . Pneumococcal Polysaccharide-23 10/25/2019  . Tdap 07/02/2011    TDAP status: Up to date Flu Vaccine status: Up to date Pneumococcal vaccine status: Completed during today's visit. Covid-19 vaccine status: Completed vaccines  Qualifies for Shingles Vaccine? Yes   Zostavax completed No   Shingrix Completed?: No.  Education has been provided regarding the importance of this vaccine. Patient has been advised to call insurance company to determine out of pocket expense if they have not yet received this vaccine. Advised may also receive vaccine at local pharmacy or Health Dept. Verbalized acceptance and understanding.  Screening Tests Health Maintenance  Topic Date Due  . DEXA SCAN  Never done  . INFLUENZA VACCINE  10/01/2019  . MAMMOGRAM  12/15/2020  . TETANUS/TDAP  07/01/2021  .  COLONOSCOPY  09/11/2029  . COVID-19 Vaccine  Completed  . Hepatitis C Screening  Completed  . PNA vac Low Risk Adult  Completed    Health Maintenance  Health Maintenance Due  Topic Date Due  . DEXA SCAN  Never done  . INFLUENZA VACCINE  10/01/2019    Colorectal cancer screening: Completed 09/12/2019. Repeat every 10 years Mammogram status: Completed 12/16/2018. Repeat every year Bone Density status: ordered  Lung Cancer Screening: (Low Dose CT Chest recommended if Age 53-80 years, 30 pack-year currently smoking OR have quit w/in 15years.) does not qualify.   Lung Cancer Screening Referral: no  Additional Screening:  Hepatitis C Screening: does qualify; Completed 11/12/2015  Vision Screening: Recommended annual ophthalmology exams for early detection of glaucoma and other disorders of the eye. Is the patient up to date with their annual eye exam?  Yes  Who is the provider or what is the name of the office in which the patient attends annual eye exams? Dr. Katy Fitch If pt is not established with a provider, would they like to be referred to a provider to establish care? No .   Dental Screening: Recommended annual dental exams for proper oral hygiene  Community Resource Referral / Chronic Care Management: CRR required this visit?  No   CCM required this visit?  No      Plan:     I have personally reviewed and noted the following in the patient's chart:   . Medical and social history . Use of alcohol, tobacco or illicit drugs  . Current medications and supplements . Functional ability and status . Nutritional status . Physical activity . Advanced directives . List of other physicians . Hospitalizations, surgeries, and ER visits in previous 12 months . Vitals . Screenings to include cognitive, depression, and falls . Referrals and appointments  In addition, I have reviewed and discussed with patient certain preventive protocols, quality metrics, and best practice  recommendations. A written personalized care plan for preventive services as well as general preventive health recommendations were provided to patient.     Kellie Simmering, LPN   05/14/9456   Nurse Notes:

## 2019-10-25 NOTE — Patient Instructions (Signed)
[name] , Thank you for taking time to come for your Medicare Wellness Visit. I appreciate your ongoing commitment to your health goals. Please review the following plan we discussed and let me know if I can assist you in the future.   Screening recommendations/referrals: Colonoscopy: completed 09/12/2019 Mammogram: completed 12/16/2018 Bone Density: ordered Recommended yearly ophthalmology/optometry visit for glaucoma screening and checkup Recommended yearly dental visit for hygiene and checkup  Vaccinations: Influenza vaccine: due Pneumococcal vaccine: today Tdap vaccine: completed 07/02/2011 Shingles vaccine: discussed   Covid-19: 06/06/2019, 05/16/2019  Advanced directives: Advance directive discussed with you today. Even though you declined this today please call our office should you change your mind and we can give you the proper paperwork for you to fill out.   Conditions/risks identified: none  Next appointment: Follow up in one year for your annual wellness visit 10/30/2020 at 10:00   Preventive Care 65 Years and Older, Female Preventive care refers to lifestyle choices and visits with your health care provider that can promote health and wellness. What does preventive care include?  A yearly physical exam. This is also called an annual well check.  Dental exams once or twice a year.  Routine eye exams. Ask your health care provider how often you should have your eyes checked.  Personal lifestyle choices, including:  Daily care of your teeth and gums.  Regular physical activity.  Eating a healthy diet.  Avoiding tobacco and drug use.  Limiting alcohol use.  Practicing safe sex.  Taking low-dose aspirin every day.  Taking vitamin and mineral supplements as recommended by your health care provider. What happens during an annual well check? The services and screenings done by your health care provider during your annual well check will depend on your age, overall  health, lifestyle risk factors, and family history of disease. Counseling  Your health care provider may ask you questions about your:  Alcohol use.  Tobacco use.  Drug use.  Emotional well-being.  Home and relationship well-being.  Sexual activity.  Eating habits.  History of falls.  Memory and ability to understand (cognition).  Work and work Statistician.  Reproductive health. Screening  You may have the following tests or measurements:  Height, weight, and BMI.  Blood pressure.  Lipid and cholesterol levels. These may be checked every 5 years, or more frequently if you are over 59 years old.  Skin check.  Lung cancer screening. You may have this screening every year starting at age 61 if you have a 30-pack-year history of smoking and currently smoke or have quit within the past 15 years.  Fecal occult blood test (FOBT) of the stool. You may have this test every year starting at age 56.  Flexible sigmoidoscopy or colonoscopy. You may have a sigmoidoscopy every 5 years or a colonoscopy every 10 years starting at age 20.  Hepatitis C blood test.  Hepatitis B blood test.  Sexually transmitted disease (STD) testing.  Diabetes screening. This is done by checking your blood sugar (glucose) after you have not eaten for a while (fasting). You may have this done every 1-3 years.  Bone density scan. This is done to screen for osteoporosis. You may have this done starting at age 32.  Mammogram. This may be done every 1-2 years. Talk to your health care provider about how often you should have regular mammograms. Talk with your health care provider about your test results, treatment options, and if necessary, the need for more tests. Vaccines  Your  health care provider may recommend certain vaccines, such as:  Influenza vaccine. This is recommended every year.  Tetanus, diphtheria, and acellular pertussis (Tdap, Td) vaccine. You may need a Td booster every 10  years.  Zoster vaccine. You may need this after age 59.  Pneumococcal 13-valent conjugate (PCV13) vaccine. One dose is recommended after age 53.  Pneumococcal polysaccharide (PPSV23) vaccine. One dose is recommended after age 72. Talk to your health care provider about which screenings and vaccines you need and how often you need them. This information is not intended to replace advice given to you by your health care provider. Make sure you discuss any questions you have with your health care provider. Document Released: 03/15/2015 Document Revised: 11/06/2015 Document Reviewed: 12/18/2014 Elsevier Interactive Patient Education  2017 [place] Prevention in the Home Falls can cause injuries. They can happen to people of all ages. There are many things you can do to make your home safe and to help prevent falls. What can I do on the outside of my home?  Regularly fix the edges of walkways and driveways and fix any cracks.  Remove anything that might make you trip as you walk through a door, such as a raised step or threshold.  Trim any bushes or trees on the path to your home.  Use bright outdoor lighting.  Clear any walking paths of anything that might make someone trip, such as rocks or tools.  Regularly check to see if handrails are loose or broken. Make sure that both sides of any steps have handrails.  Any raised decks and porches should have guardrails on the edges.  Have any leaves, snow, or ice cleared regularly.  Use sand or salt on walking paths during winter.  Clean up any spills in your garage right away. This includes oil or grease spills. What can I do in the bathroom?  Use night lights.  Install grab bars by the toilet and in the tub and shower. Do not use towel bars as grab bars.  Use non-skid mats or decals in the tub or shower.  If you need to sit down in the shower, use a plastic, non-slip stool.  Keep the floor dry. Clean up any water that  spills on the floor as soon as it happens.  Remove soap buildup in the tub or shower regularly.  Attach bath mats securely with double-sided non-slip rug tape.  Do not have throw rugs and other things on the floor that can make you trip. What can I do in the bedroom?  Use night lights.  Make sure that you have a light by your bed that is easy to reach.  Do not use any sheets or blankets that are too big for your bed. They should not hang down onto the floor.  Have a firm chair that has side arms. You can use this for support while you get dressed.  Do not have throw rugs and other things on the floor that can make you trip. What can I do in the kitchen?  Clean up any spills right away.  Avoid walking on wet floors.  Keep items that you use a lot in easy-to-reach places.  If you need to reach something above you, use a strong step stool that has a grab bar.  Keep electrical cords out of the way.  Do not use floor polish or wax that makes floors slippery. If you must use wax, use non-skid floor wax.  Do not  have throw rugs and other things on the floor that can make you trip. What can I do with my stairs?  Do not leave any items on the stairs.  Make sure that there are handrails on both sides of the stairs and use them. Fix handrails that are broken or loose. Make sure that handrails are as long as the stairways.  Check any carpeting to make sure that it is firmly attached to the stairs. Fix any carpet that is loose or worn.  Avoid having throw rugs at the top or bottom of the stairs. If you do have throw rugs, attach them to the floor with carpet tape.  Make sure that you have a light switch at the top of the stairs and the bottom of the stairs. If you do not have them, ask someone to add them for you. What else can I do to help prevent falls?  Wear shoes that:  Do not have high heels.  Have rubber bottoms.  Are comfortable and fit you well.  Are closed at the  toe. Do not wear sandals.  If you use a stepladder:  Make sure that it is fully opened. Do not climb a closed stepladder.  Make sure that both sides of the stepladder are locked into place.  Ask someone to hold it for you, if possible.  Clearly mark and make sure that you can see:  Any grab bars or handrails.  First and last steps.  Where the edge of each step is.  Use tools that help you move around (mobility aids) if they are needed. These include:  Canes.  Walkers.  Scooters.  Crutches.  Turn on the lights when you go into a dark area. Replace any light bulbs as soon as they burn out.  Set up your furniture so you have a clear path. Avoid moving your furniture around.  If any of your floors are uneven, fix them.  If there are any pets around you, be aware of where they are.  Review your medicines with your doctor. Some medicines can make you feel dizzy. This can increase your chance of falling. Ask your doctor what other things that you can do to help prevent falls. This information is not intended to replace advice given to you by your health care provider. Make sure you discuss any questions you have with your health care provider. Document Released: 12/13/2008 Document Revised: 07/25/2015 Document Reviewed: 03/23/2014 Elsevier Interactive Patient Education  2017 [employer].

## 2019-10-26 LAB — LIPID PANEL
Chol/HDL Ratio: 6 ratio — ABNORMAL HIGH (ref 0.0–4.4)
Cholesterol, Total: 251 mg/dL — ABNORMAL HIGH (ref 100–199)
HDL: 42 mg/dL (ref >39)
LDL Chol Calc (NIH): 191 mg/dL — ABNORMAL HIGH (ref 0–99)
Triglycerides: 101 mg/dL (ref 0–149)
VLDL Cholesterol Cal: 18 mg/dL (ref 5–40)

## 2019-11-14 ENCOUNTER — Telehealth: Payer: Self-pay

## 2019-11-30 ENCOUNTER — Encounter: Payer: Self-pay | Admitting: Cardiovascular Disease

## 2019-11-30 ENCOUNTER — Ambulatory Visit (INDEPENDENT_AMBULATORY_CARE_PROVIDER_SITE_OTHER): Payer: Medicare HMO | Admitting: Cardiovascular Disease

## 2019-11-30 ENCOUNTER — Other Ambulatory Visit: Payer: Self-pay

## 2019-11-30 VITALS — BP 142/80 | HR 72 | Temp 97.1°F | Ht 64.0 in | Wt 168.0 lb

## 2019-11-30 DIAGNOSIS — E785 Hyperlipidemia, unspecified: Secondary | ICD-10-CM

## 2019-11-30 DIAGNOSIS — R002 Palpitations: Secondary | ICD-10-CM | POA: Diagnosis not present

## 2019-11-30 DIAGNOSIS — I10 Essential (primary) hypertension: Secondary | ICD-10-CM | POA: Diagnosis not present

## 2019-11-30 HISTORY — DX: Palpitations: R00.2

## 2019-11-30 NOTE — Progress Notes (Signed)
Cardiology Office Note   Date:  11/30/2019   ID:  Wendy Mosley, DOB 1950/06/03, MRN [id number]  PCP:  [name], FNP  Cardiologist:   Skeet Latch, MD   No chief complaint on file.    History of Present Illness: Wendy Mosley is a 69 y.o. femalewith hypertension, hyperlipidemia, and prior tobacco abuse who is being seen today for the evaluation of palpitations at the request of [name], [place].  She saw [name] on 10/25/2019 he reported episodes of racing heart.  EKG at the time revealed sinus rhythm with no arrhythmias.  Thyroid function, CMP and CBC were unremarkable.  One night she laid down on the sofa and her heart started racing. It wouldn't stop with positional changes.  The episode lasted for several minutes and she felt short of breath.  She noted slight pain in her head at the time.  She also noted some lightheadedness and mild nausea.  She notes that sometimes if she eats too fast or if she eats late she gets GERD or her heart races.  Her job is stressful.  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  She has no edema, orthopnea or PND.  She snores but feels rested in the daytime and has no daytime somnolence.  She doesn't exercise much anymore.  In the past she liked to but is unsure why she doesn't anymore.  She frequently has bacon, egg, toast and grits in the morning.  She tries to bake or grill her meat and doesn't fry food often.    She doesn't check her blood pressure at home.  She tried several medications in the past and didn't tolerate them.    Past Medical History:  Diagnosis Date  . Cataracts, bilateral    right eye - just watching  . Diverticulitis   . GERD (gastroesophageal reflux disease) 2006  . Hearing loss    right ear - no hearing aid  . Heart attack ([place]) 2008  . Hypertension   . Irregular heart beat 1973  . Palpitations 11/30/2019  . SVD (spontaneous vaginal delivery)    x 3    Past Surgical History:  Procedure Laterality  Date  . APPENDECTOMY  1967  . BREAST SURGERY     cyst removed from right breast at age 49  . CHOLECYSTECTOMY  1975  . COLONOSCOPY    . DILATATION & CURETTAGE/HYSTEROSCOPY WITH MYOSURE N/A 02/21/2018   Procedure: DILATATION & CURETTAGE/HYSTEROSCOPY WITH MYOSURE RESECTION OF ENDOMETRIAL POLYP;  Surgeon: Servando Salina, MD;  Location: [facility];  Service: Gynecology;  Laterality: N/A;  . EXTERNAL EAR SURGERY  1974  . TUBAL LIGATION  1977     Current Outpatient Medications  Medication Sig Dispense Refill  . aspirin 81 MG tablet Take 81 mg by mouth every other day.     . B COMPLEX-ZINC PO Take 1 tablet by mouth daily.    . clobetasol cream (TEMOVATE) 0.05 % APPLY TO AFFECTED AREA TWICE A DAY AS NEEDED 30 g 0  . dexlansoprazole (DEXILANT) 60 MG capsule Take 1 capsule (60 mg total) by mouth daily. 30 capsule 2  . diclofenac Sodium (VOLTAREN) 1 % GEL diclofenac 1 % topical gel    . ibuprofen (ADVIL,MOTRIN) 200 MG tablet Take 400-600 mg by mouth every 6 (six) hours as needed for moderate pain.     No current facility-administered medications for this visit.    Allergies:   Sulfonamide derivatives    Social History:  The patient  reports that she quit smoking about 8 years ago. Her smoking use included cigarettes. She has a 30.00 pack-year smoking history. She has never used smokeless tobacco. She reports previous alcohol use. She reports that she does not use drugs.   Family History:  The patient's family history includes Asthma in her mother; COPD in her mother; Cancer in her maternal grandmother; Diabetes in her sister; Heart failure in her mother; Stroke in her mother.    ROS:  Please see the history of present illness.   Otherwise, review of systems are positive for none.   All other systems are reviewed and negative.    PHYSICAL EXAM: VS:  BP (!) 142/80   Pulse 72   Temp (!) 97.1 F (36.2 C)   Ht 5\' 4"  (1.626 m)   Wt 168 lb (76.2 kg)   BMI 28.84 kg/m  , BMI Body mass index  is 28.84 kg/m. GENERAL:  Well appearing HEENT:  Pupils equal round and reactive, fundi not visualized, oral mucosa unremarkable NECK:  No jugular venous distention, waveform within normal limits, carotid upstroke brisk and symmetric, no bruits, no thyromegaly LYMPHATICS:  No cervical adenopathy LUNGS:  Clear to auscultation bilaterally HEART:  RRR.  PMI not displaced or sustained,S1 and S2 within normal limits, no S3, no S4, no clicks, no rubs, no  murmurs ABD:  Flat, positive bowel sounds normal in frequency in pitch, no bruits, no rebound, no guarding, no midline pulsatile mass, no hepatomegaly, no splenomegaly EXT:  2 plus pulses throughout, no edema, no cyanosis no clubbing SKIN:  No rashes no nodules NEURO:  Cranial nerves II through XII grossly intact, motor grossly intact throughout PSYCH:  Cognitively intact, oriented to person place and time   EKG:  EKG is ordered today. The ekg ordered today demonstrates sinus rhythm.  Rate 72 bpm.  Non-specific T wave abnormalities.   Recent Labs: 10/25/2019: ALT 16; BUN 11; Creatinine, Ser 0.99; Hemoglobin 12.1; Platelets 348; Potassium 4.3; Sodium 141; TSH 1.460    Lipid Panel    Component Value Date/Time   CHOL 251 (H) 10/25/2019 1133   TRIG 101 10/25/2019 1133   HDL 42 10/25/2019 1133   CHOLHDL 6.0 (H) 10/25/2019 1133   CHOLHDL 4.5 11/12/2015 1138   VLDL 17 11/12/2015 1138   LDLCALC 191 (H) 10/25/2019 1133   LDLDIRECT 138 (H) 07/02/2011 1521      Wt Readings from Last 3 Encounters:  11/30/19 168 lb (76.2 kg)  10/25/19 167 lb (75.8 kg)  10/25/19 167 lb 3.2 oz (75.8 kg)      ASSESSMENT AND PLAN:  # Hypertension: Blood pressure is elevated here both initially and on repeat.  She has had elevated blood pressures in the past as well.  She prefers not to take medications.  We discussed the fact that given that her blood pressure is minimally elevated she very likely could get her blood pressure under better control.  Her goal is  less than 130/80.  However she needs to start exercising at least 150 minutes weekly.  She also needs to limit sodium intake.  We discussed lowering her sodium intake to 1500 mg daily or at least cutting it by 1000 mg a day.  We will give her information on the DASH diet.  She will start checking her blood pressures at home and bring to follow-up.  # Palpitations:  Episodes are sporadic.  We are unlikely to catch it on an ambulatory monitor.  She will get an  [employer] monitor to catch it if it occurs.  Labs were unremarkable.    #Pure hypercholesterolemia: Lipids are very elevated.  As with her blood pressure she does not want medications.  We discussed limiting fried foods, fatty foods, red meat, and cheese.  She is also going to start exercising as above.  We will see her back in a few months and recheck lipids at that time.   Current medicines are reviewed at length with the patient today.  The patient does not have concerns regarding medicines.  The following changes have been made:  no change  Labs/ tests ordered today include:   Orders Placed This Encounter  Procedures  . Lipid panel  . Comprehensive metabolic panel  . EKG 12-Lead     Disposition:   FU with Langston Summerfield C. Oval Linsey, MD, [facility] in 3 months     Signed, Parv Manthey C. Oval Linsey, MD, [facility]  11/30/2019 10:33 AM    [facility]

## 2019-11-30 NOTE — Patient Instructions (Addendum)
Medication Instructions:  Your physician recommends that you continue on your current medications as directed. Please refer to the Current Medication list given to you today.  *If you need a refill on your cardiac medications before your next appointment, please call your pharmacy*  Lab Work: FASTING LP/CMET PRIOR TO OFFICE VISIT   Testing/Procedures: NONE  Follow-Up: At [facility], you and your health needs are our priority.  As part of our continuing mission to provide you with exceptional heart care, we have created designated Provider Care Teams.  These Care Teams include your primary Cardiologist (physician) and Advanced Practice Providers (APPs -  Physician Assistants and Nurse Practitioners) who all work together to provide you with the care you need, when you need it.  We recommend signing up for the patient portal called "MyChart".  Sign up information is provided on this After Visit Summary.  MyChart is used to connect with patients for Virtual Visits (Telemedicine).  Patients are able to view lab/test results, encounter notes, upcoming appointments, etc.  Non-urgent messages can be sent to your provider as well.   To learn more about what you can do with MyChart, go to [email or web address].    Your next appointment:   3 month(s)  The format for your next appointment:   In Person  Provider:   You may see [facility] or one of the following Advanced Practice Providers on your designated Care Team:    Kerin Ransom, PA-C  [place], [place]  Coletta Memos, [place]  Other Instructions  MONITOR AND LOG YOUR BLOOD PRESSURE AT HOME, BRING READINGS TO YOUR FOLLOW UP   CONSIDER GETTING ALIVE COR   EXERCISE 150 MINUTES [place] FRIED/FATTY FOODS, RED MEAT, AND CHEESE INTAKE    DASH Eating Plan DASH stands for "Dietary Approaches to Stop Hypertension." The DASH eating plan is a healthy eating plan that has been shown to reduce high blood pressure  (hypertension). It may also reduce your risk for type 2 diabetes, heart disease, and stroke. The DASH eating plan may also help with weight loss. What are tips for following this plan?  General guidelines  Avoid eating more than 2,300 mg (milligrams) of salt (sodium) a day. If you have hypertension, you may need to reduce your sodium intake to 1,500 mg a day.  Limit alcohol intake to no more than 1 drink a day for nonpregnant women and 2 drinks a day for men. One drink equals 12 oz of beer, 5 oz of wine, or 1 oz of hard liquor.  Work with your health care provider to maintain a healthy body weight or to lose weight. Ask what an ideal weight is for you.  Get at least 30 minutes of exercise that causes your heart to beat faster (aerobic exercise) most days of the week. Activities may include walking, swimming, or biking.  Work with your health care provider or diet and nutrition specialist (dietitian) to adjust your eating plan to your individual calorie needs. Reading food labels   Check food labels for the amount of sodium per serving. Choose foods with less than 5 percent of the Daily Value of sodium. Generally, foods with less than 300 mg of sodium per serving fit into this eating plan.  To find whole grains, look for the word "whole" as the first word in the ingredient list. Shopping  Buy products labeled as "low-sodium" or "no salt added."  Buy fresh foods. Avoid canned foods and premade or frozen meals. Cooking  Avoid adding salt when cooking. Use salt-free seasonings or herbs instead of table salt or sea salt. Check with your health care provider or pharmacist before using salt substitutes.  Do not fry foods. Cook foods using healthy methods such as baking, boiling, grilling, and broiling instead.  Cook with heart-healthy oils, such as olive, canola, soybean, or sunflower oil. Meal planning  Eat a balanced diet that includes: ? 5 or more servings of fruits and vegetables  each day. At each meal, try to fill half of your plate with fruits and vegetables. ? Up to 6-8 servings of whole grains each day. ? Less than 6 oz of lean meat, poultry, or fish each day. A 3-oz serving of meat is about the same size as a deck of cards. One egg equals 1 oz. ? 2 servings of low-fat dairy each day. ? A serving of nuts, seeds, or beans 5 times each week. ? Heart-healthy fats. Healthy fats called Omega-3 fatty acids are found in foods such as flaxseeds and coldwater fish, like sardines, salmon, and mackerel.  Limit how much you eat of the following: ? Canned or prepackaged foods. ? Food that is high in trans fat, such as fried foods. ? Food that is high in saturated fat, such as fatty meat. ? Sweets, desserts, sugary drinks, and other foods with added sugar. ? Full-fat dairy products.  Do not salt foods before eating.  Try to eat at least 2 vegetarian meals each week.  Eat more home-cooked food and less restaurant, buffet, and fast food.  When eating at a restaurant, ask that your food be prepared with less salt or no salt, if possible. What foods are recommended? The items listed may not be a complete list. Talk with your dietitian about what dietary choices are best for you. Grains Whole-grain or whole-wheat bread. Whole-grain or whole-wheat pasta. Brown rice. [place]. Bulgur. Whole-grain and low-sodium cereals. Pita bread. Low-fat, low-sodium crackers. Whole-wheat flour tortillas. Vegetables Fresh or frozen vegetables (raw, steamed, roasted, or grilled). Low-sodium or reduced-sodium tomato and vegetable juice. Low-sodium or reduced-sodium tomato sauce and tomato paste. Low-sodium or reduced-sodium canned vegetables. Fruits All fresh, dried, or frozen fruit. Canned fruit in natural juice (without added sugar). Meat and other protein foods Skinless chicken or [place]. Ground chicken or [place]. Pork with fat trimmed off. Fish and seafood. Egg whites. Dried beans,  peas, or lentils. Unsalted nuts, nut butters, and seeds. Unsalted canned beans. Lean cuts of beef with fat trimmed off. Low-sodium, lean deli meat. Dairy Low-fat (1%) or fat-free (skim) milk. Fat-free, low-fat, or reduced-fat cheeses. Nonfat, low-sodium ricotta or cottage cheese. Low-fat or nonfat yogurt. Low-fat, low-sodium cheese. Fats and oils Soft margarine without trans fats. Vegetable oil. Low-fat, reduced-fat, or light mayonnaise and salad dressings (reduced-sodium). Canola, safflower, olive, soybean, and sunflower oils. Avocado. Seasoning and other foods Herbs. Spices. Seasoning mixes without salt. Unsalted popcorn and pretzels. Fat-free sweets. What foods are not recommended? The items listed may not be a complete list. Talk with your dietitian about what dietary choices are best for you. Grains Baked goods made with fat, such as croissants, muffins, or some breads. Dry pasta or rice meal packs. Vegetables Creamed or fried vegetables. Vegetables in a cheese sauce. Regular canned vegetables (not low-sodium or reduced-sodium). Regular canned tomato sauce and paste (not low-sodium or reduced-sodium). Regular tomato and vegetable juice (not low-sodium or reduced-sodium). Angie Fava. Olives. Fruits Canned fruit in a light or heavy syrup. Fried fruit. Fruit in cream or butter sauce. Meat  and other protein foods Fatty cuts of meat. Ribs. Fried meat. Berniece Salines. Sausage. Bologna and other processed lunch meats. Salami. Fatback. Hotdogs. Bratwurst. Salted nuts and seeds. Canned beans with added salt. Canned or smoked fish. Whole eggs or egg yolks. Chicken or [place] with skin. Dairy Whole or 2% milk, cream, and half-and-half. Whole or full-fat cream cheese. Whole-fat or sweetened yogurt. Full-fat cheese. Nondairy creamers. Whipped toppings. Processed cheese and cheese spreads. Fats and oils Butter. Stick margarine. Lard. Shortening. Ghee. Bacon fat. Tropical oils, such as coconut, palm kernel, or palm  oil. Seasoning and other foods Salted popcorn and pretzels. Onion salt, garlic salt, seasoned salt, table salt, and sea salt. Worcestershire sauce. Tartar sauce. Barbecue sauce. Teriyaki sauce. Soy sauce, including reduced-sodium. Steak sauce. Canned and packaged gravies. Fish sauce. Oyster sauce. Cocktail sauce. Horseradish that you find on the shelf. Ketchup. Mustard. Meat flavorings and tenderizers. Bouillon cubes. Hot sauce and Tabasco sauce. Premade or packaged marinades. Premade or packaged taco seasonings. Relishes. Regular salad dressings. Where to find more information:  National Heart, Lung, and [place]: https://wilson-eaton.com/  American Heart Association: www.heart.org Summary  The DASH eating plan is a healthy eating plan that has been shown to reduce high blood pressure (hypertension). It may also reduce your risk for type 2 diabetes, heart disease, and stroke.  With the DASH eating plan, you should limit salt (sodium) intake to 2,300 mg a day. If you have hypertension, you may need to reduce your sodium intake to 1,500 mg a day.  When on the DASH eating plan, aim to eat more fresh fruits and vegetables, whole grains, lean proteins, low-fat dairy, and heart-healthy fats.  Work with your health care provider or diet and nutrition specialist (dietitian) to adjust your eating plan to your individual calorie needs. This information is not intended to replace advice given to you by your health care provider. Make sure you discuss any questions you have with your health care provider. Document Revised: 01/29/2017 Document Reviewed: 02/10/2016 Elsevier Patient Education  2020 [employer].

## 2019-12-18 ENCOUNTER — Ambulatory Visit: Payer: Medicare HMO

## 2019-12-19 ENCOUNTER — Telehealth: Payer: Self-pay

## 2019-12-20 ENCOUNTER — Telehealth: Payer: Self-pay

## 2019-12-21 ENCOUNTER — Ambulatory Visit
Admission: RE | Admit: 2019-12-21 | Discharge: 2019-12-21 | Disposition: A | Discharge status: Home / Self Care | Payer: Medicare HMO | Source: Ambulatory Visit | Attending: Nurse Practitioner | Admitting: Nurse Practitioner

## 2019-12-21 ENCOUNTER — Other Ambulatory Visit: Payer: Self-pay

## 2019-12-21 DIAGNOSIS — Z1231 Encounter for screening mammogram for malignant neoplasm of breast: Secondary | ICD-10-CM

## 2020-01-31 ENCOUNTER — Telehealth: Payer: Self-pay

## 2020-01-31 NOTE — Telephone Encounter (Cosign Needed)
  Chronic Care Management   Outreach Note  01/31/2020 Name: Wendy Mosley MRN: [id number] DOB: [birth year]  Referred by: [name], FNP Reason for referral : Chronic Care Management (RNCM FU Call)   An unsuccessful telephone outreach was attempted today. The patient was referred to the case management team for assistance with care management and care coordination.   Follow Up Plan: A HIPAA compliant phone message was left for the patient providing contact information and requesting a return call. Telephone follow up appointment with care management team member scheduled for: 03/13/20  Barb Merino, RN, BSN, CCM Care Management Coordinator [place] [facility]  Direct Phone: [phone]

## 2020-02-08 DIAGNOSIS — K006 Disturbances in tooth eruption: Secondary | ICD-10-CM | POA: Diagnosis not present

## 2020-02-16 ENCOUNTER — Other Ambulatory Visit: Payer: Medicare HMO

## 2020-03-07 ENCOUNTER — Encounter: Payer: Self-pay | Admitting: Cardiovascular Disease

## 2020-03-07 ENCOUNTER — Ambulatory Visit (INDEPENDENT_AMBULATORY_CARE_PROVIDER_SITE_OTHER): Payer: Medicare HMO | Admitting: Cardiovascular Disease

## 2020-03-07 ENCOUNTER — Other Ambulatory Visit: Payer: Self-pay

## 2020-03-07 VITALS — BP 122/70 | HR 80 | Ht 64.0 in | Wt 168.6 lb

## 2020-03-07 DIAGNOSIS — E785 Hyperlipidemia, unspecified: Secondary | ICD-10-CM

## 2020-03-07 DIAGNOSIS — I10 Essential (primary) hypertension: Secondary | ICD-10-CM | POA: Diagnosis not present

## 2020-03-07 NOTE — Progress Notes (Signed)
Cardiology Office Note   Date:  03/07/2020   ID:  Wendy Mosley, DOB 1950-03-08, MRN [id number]  PCP:  [name], FNP  Cardiologist:   Skeet Latch, MD   No chief complaint on file.    History of Present Illness: Wendy Mosley is a 70 y.o. femalewith hypertension, hyperlipidemia, and prior tobacco abuse who is being seen today for the evaluation of palpitations at the request of [name], [place].  She saw [name] on 10/25/2019 he reported episodes of racing heart.  EKG at the time revealed sinus rhythm with no arrhythmias.  Thyroid function, CMP and CBC were unremarkable.  One night she laid down on the sofa and her heart started racing. It wouldn't stop with positional changes.  The episode lasted for several minutes and she felt short of breath.  She noted slight pain in her head at the time.  She also noted some lightheadedness and mild nausea.   At her last appointment both her blood pressure and cholesterol poorly controlled.  However she was hesitant to start any medications.  She tried several medications in the past and didn't tolerate them.    She wanted to work on diet and exercise.  Her only complaint is pain in her arm and knee.  She takes Tylenol which helped.  She joined [employer] and plans to start working out.  She cooks at home and tries to limit fried foods and red meat.  She has been under a lot of stress because her daughter is 58 and has stage IV lung cancer. She has hypoxia.  Her appetite has been poor.  She is scheduled to have teeth extracted.  She hasn't had any chest pain and her breathing is stable.  She struggles with GERD and takes gelusil.  She isn't taking asprin every day because she thinks it may be upsetting her GERD.  Past Medical History:  Diagnosis Date  . Cataracts, bilateral    right eye - just watching  . Diverticulitis   . GERD (gastroesophageal reflux disease) 2006  . Hearing loss    right ear - no hearing aid  . Heart  attack ([place]) 2008  . Hypertension   . Irregular heart beat 1973  . Palpitations 11/30/2019  . SVD (spontaneous vaginal delivery)    x 3    Past Surgical History:  Procedure Laterality Date  . APPENDECTOMY  1967  . BREAST SURGERY     cyst removed from right breast at age 16  . CHOLECYSTECTOMY  1975  . COLONOSCOPY    . DILATATION & CURETTAGE/HYSTEROSCOPY WITH MYOSURE N/A 02/21/2018   Procedure: DILATATION & CURETTAGE/HYSTEROSCOPY WITH MYOSURE RESECTION OF ENDOMETRIAL POLYP;  Surgeon: Servando Salina, MD;  Location: [facility];  Service: Gynecology;  Laterality: N/A;  . EXTERNAL EAR SURGERY  1974  . TUBAL LIGATION  1977     Current Outpatient Medications  Medication Sig Dispense Refill  . aspirin 81 MG tablet Take 81 mg by mouth every other day.     . B COMPLEX-ZINC PO Take 1 tablet by mouth daily.    . clobetasol cream (TEMOVATE) 0.05 % APPLY TO AFFECTED AREA TWICE A DAY AS NEEDED 30 g 0  . dexlansoprazole (DEXILANT) 60 MG capsule Take 1 capsule (60 mg total) by mouth daily. 30 capsule 2  . diclofenac Sodium (VOLTAREN) 1 % GEL diclofenac 1 % topical gel    . ibuprofen (ADVIL,MOTRIN) 200 MG tablet Take 400-600 mg by mouth every 6 (six)  hours as needed for moderate pain.     No current facility-administered medications for this visit.    Allergies:   Sulfonamide derivatives    Social History:  The patient  reports that she quit smoking about 9 years ago. Her smoking use included cigarettes. She has a 30.00 pack-year smoking history. She has never used smokeless tobacco. She reports previous alcohol use. She reports that she does not use drugs.   Family History:  The patient's family history includes Asthma in her mother; COPD in her mother; Cancer in her maternal grandmother; Diabetes in her sister; Heart failure in her mother; Stroke in her mother.    ROS:  Please see the history of present illness.   Otherwise, review of systems are positive for none.   All other systems are  reviewed and negative.    PHYSICAL EXAM: VS:  BP 122/70   Pulse 80   Ht 5\' 4"  (1.626 m)   Wt 168 lb 9.6 oz (76.5 kg)   BMI 28.94 kg/m  , BMI Body mass index is 28.94 kg/m. GENERAL:  Well appearing HEENT:  Pupils equal round and reactive, fundi not visualized, oral mucosa unremarkable NECK:  No jugular venous distention, waveform within normal limits, carotid upstroke brisk and symmetric, no bruits LUNGS:  Clear to auscultation bilaterally HEART:  RRR.  PMI not displaced or sustained,S1 and S2 within normal limits, no S3, no S4, no clicks, no rubs, no  murmurs ABD:  Flat, positive bowel sounds normal in frequency in pitch, no bruits, no rebound, no guarding, no midline pulsatile mass, no hepatomegaly, no splenomegaly EXT:  2 plus pulses throughout, no edema, no cyanosis no clubbing SKIN:  No rashes no nodules NEURO:  Cranial nerves II through XII grossly intact, motor grossly intact throughout PSYCH:  Cognitively intact, oriented to person place and time   EKG:  EKG is not ordered today. The ekg ordered 11/30/2019 demonstrates sinus rhythm.  Rate 72 bpm.  Non-specific T wave abnormalities.   Recent Labs: 10/25/2019: ALT 16; BUN 11; Creatinine, Ser 0.99; Hemoglobin 12.1; Platelets 348; Potassium 4.3; Sodium 141; TSH 1.460    Lipid Panel    Component Value Date/Time   CHOL 251 (H) 10/25/2019 1133   TRIG 101 10/25/2019 1133   HDL 42 10/25/2019 1133   CHOLHDL 6.0 (H) 10/25/2019 1133   CHOLHDL 4.5 11/12/2015 1138   VLDL 17 11/12/2015 1138   LDLCALC 191 (H) 10/25/2019 1133   LDLDIRECT 138 (H) 07/02/2011 1521      Wt Readings from Last 3 Encounters:  03/07/20 168 lb 9.6 oz (76.5 kg)  11/30/19 168 lb (76.2 kg)  10/25/19 167 lb (75.8 kg)      ASSESSMENT AND PLAN:  # Hypertension: Blood pressure was initially elevated but better on repeat.  She has been hesitant to try medications in the past.  I asked  Work on increasing her exercise to 150 minutes and continue to limit  the sodium in her diet.  # Palpitations:  Episodes are sporadic.  We are unlikely to catch it on an ambulatory monitor.  She will get an Alive[employer] monitor to catch it if it occurs.  Labs were unremarkable.  Lately symptoms have been better controlled.  # Pure hypercholesterolemia: Lipids are very elevated.  She has been unable to start working on diet and exercise much lately.  She has been under a lot of stress.  She is going to get recommitted to these efforts.  Repeat lipids and a CMP in  3 months.  She will also get a coronary calcium score to better inform her decision about whether or not to start statin therapy.   Current medicines are reviewed at length with the patient today.  The patient does not have concerns regarding medicines.  The following changes have been made:  no change  Labs/ tests ordered today include:   Orders Placed This Encounter  Procedures  . CT CARDIAC SCORING (SELF PAY ONLY)  . Lipid panel  . Comprehensive metabolic panel     Disposition:   FU with Alberto Pina C. Oval Linsey, MD, [facility] in 3 months     Signed, Dionicia Cerritos C. Oval Linsey, MD, [facility]  03/07/2020 12:15 PM    [place]

## 2020-03-07 NOTE — Patient Instructions (Addendum)
Medication Instructions:  Your physician recommends that you continue on your current medications as directed. Please refer to the Current Medication list given to you today.  *If you need a refill on your cardiac medications before your next appointment, please call your pharmacy*  Lab Work: FASTING LP/CMET FEW DAYS PRIOR TO YOUR FOLLOW UP   If you have labs (blood work) drawn today and your tests are completely normal, you will receive your results only by: [name] MyChart Message (if you have MyChart) OR . A paper copy in the mail If you have any lab test that is abnormal or we need to change your treatment, we will call you to review the results.  Testing/Procedures: CALCIUM SCORE [address] THIS WILL COST YOU $99 OUT OF POCKET   Follow-Up: At [facility], you and your health needs are our priority.  As part of our continuing mission to provide you with exceptional heart care, we have created designated Provider Care Teams.  These Care Teams include your primary Cardiologist (physician) and Advanced Practice Providers (APPs -  Physician Assistants and Nurse Practitioners) who all work together to provide you with the care you need, when you need it.  We recommend signing up for the patient portal called "MyChart".  Sign up information is provided on this After Visit Summary.  MyChart is used to connect with patients for Virtual Visits (Telemedicine).  Patients are able to view lab/test results, encounter notes, upcoming appointments, etc.  Non-urgent messages can be sent to your provider as well.   To learn more about what you can do with MyChart, go to [email or web address].    Your next appointment:   3 month(s)  The format for your next appointment:   In Person  Provider:   You may see DR [facility] or one of the following Advanced Practice Providers on your designated Care Team:    Kerin Ransom, PA-C  [place], [place]  Coletta Memos, [place]   WORK ON DIET  AND EXERCISE  LIMIT YOUR SALT INTAKE TO 1,500 MG DAILY    Low-Sodium Eating Plan Sodium, which is an element that makes up salt, helps you maintain a healthy balance of fluids in your body. Too much sodium can increase your blood pressure and cause fluid and waste to be held in your body. Your health care provider or dietitian may recommend following this plan if you have high blood pressure (hypertension), kidney disease, liver disease, or heart failure. Eating less sodium can help lower your blood pressure, reduce swelling, and protect your heart, liver, and kidneys. What are tips for following this plan? General guidelines  Most people on this plan should limit their sodium intake to 1,500-2,000 mg (milligrams) of sodium each day. Reading food labels   The Nutrition Facts label lists the amount of sodium in one serving of the food. If you eat more than one serving, you must multiply the listed amount of sodium by the number of servings.  Choose foods with less than 140 mg of sodium per serving.  Avoid foods with 300 mg of sodium or more per serving. Shopping  Look for lower-sodium products, often labeled as "low-sodium" or "no salt added."  Always check the sodium content even if foods are labeled as "unsalted" or "no salt added".  Buy fresh foods. ? Avoid canned foods and premade or frozen meals. ? Avoid canned, cured, or processed meats  Buy breads that have less than 80 mg of sodium per slice. Cooking  Eat more home-cooked food and less restaurant, buffet, and fast food.  Avoid adding salt when cooking. Use salt-free seasonings or herbs instead of table salt or sea salt. Check with your health care provider or pharmacist before using salt substitutes.  Cook with plant-based oils, such as canola, sunflower, or olive oil. Meal planning  When eating at a restaurant, ask that your food be prepared with less salt or no salt, if possible.  Avoid foods that contain MSG  (monosodium glutamate). MSG is sometimes added to [place] food, bouillon, and some canned foods. What foods are recommended? The items listed may not be a complete list. Talk with your dietitian about what dietary choices are best for you. Grains Low-sodium cereals, including oats, puffed wheat and rice, and shredded wheat. Low-sodium crackers. Unsalted rice. Unsalted pasta. Low-sodium bread. Whole-grain breads and whole-grain pasta. Vegetables Fresh or frozen vegetables. "No salt added" canned vegetables. "No salt added" tomato sauce and paste. Low-sodium or reduced-sodium tomato and vegetable juice. Fruits Fresh, frozen, or canned fruit. Fruit juice. Meats and other protein foods Fresh or frozen (no salt added) meat, poultry, seafood, and fish. Low-sodium canned tuna and salmon. Unsalted nuts. Dried peas, beans, and lentils without added salt. Unsalted canned beans. Eggs. Unsalted nut butters. Dairy Milk. Soy milk. Cheese that is naturally low in sodium, such as ricotta cheese, fresh mozzarella, or Swiss cheese Low-sodium or reduced-sodium cheese. Cream cheese. Yogurt. Fats and oils Unsalted butter. Unsalted margarine with no trans fat. Vegetable oils such as canola or olive oils. Seasonings and other foods Fresh and dried herbs and spices. Salt-free seasonings. Low-sodium mustard and ketchup. Sodium-free salad dressing. Sodium-free light mayonnaise. Fresh or refrigerated horseradish. Lemon juice. Vinegar. Homemade, reduced-sodium, or low-sodium soups. Unsalted popcorn and pretzels. Low-salt or salt-free chips. What foods are not recommended? The items listed may not be a complete list. Talk with your dietitian about what dietary choices are best for you. Grains Instant hot cereals. Bread stuffing, pancake, and biscuit mixes. Croutons. Seasoned rice or pasta mixes. Noodle soup cups. Boxed or frozen macaroni and cheese. Regular salted crackers. Self-rising flour. Vegetables Sauerkraut, pickled  vegetables, and relishes. Olives. [place] fries. Onion rings. Regular canned vegetables (not low-sodium or reduced-sodium). Regular canned tomato sauce and paste (not low-sodium or reduced-sodium). Regular tomato and vegetable juice (not low-sodium or reduced-sodium). Frozen vegetables in sauces. Meats and other protein foods Meat or fish that is salted, canned, smoked, spiced, or pickled. Bacon, ham, sausage, hotdogs, corned beef, chipped beef, packaged lunch meats, salt pork, jerky, pickled herring, anchovies, regular canned tuna, sardines, salted nuts. Dairy Processed cheese and cheese spreads. Cheese curds. Blue cheese. Feta cheese. String cheese. Regular cottage cheese. Buttermilk. Canned milk. Fats and oils Salted butter. Regular margarine. Ghee. Bacon fat. Seasonings and other foods Onion salt, garlic salt, seasoned salt, table salt, and sea salt. Canned and packaged gravies. Worcestershire sauce. Tartar sauce. Barbecue sauce. Teriyaki sauce. Soy sauce, including reduced-sodium. Steak sauce. Fish sauce. Oyster sauce. Cocktail sauce. Horseradish that you find on the shelf. Regular ketchup and mustard. Meat flavorings and tenderizers. Bouillon cubes. Hot sauce and Tabasco sauce. Premade or packaged marinades. Premade or packaged taco seasonings. Relishes. Regular salad dressings. Salsa. Potato and tortilla chips. Corn chips and puffs. Salted popcorn and pretzels. Canned or dried soups. Pizza. Frozen entrees and pot pies. Summary  Eating less sodium can help lower your blood pressure, reduce swelling, and protect your heart, liver, and kidneys.  Most people on this plan should limit their sodium intake to  1,500-2,000 mg (milligrams) of sodium each day.  Canned, boxed, and frozen foods are high in sodium. Restaurant foods, fast foods, and pizza are also very high in sodium. You also get sodium by adding salt to food.  Try to cook at home, eat more fresh fruits and vegetables, and eat less fast  food, canned, processed, or prepared foods. This information is not intended to replace advice given to you by your health care provider. Make sure you discuss any questions you have with your health care provider. Document Revised: 01/29/2017 Document Reviewed: 02/10/2016 Elsevier Patient Education  2020 [employer].

## 2020-03-13 ENCOUNTER — Telehealth: Payer: Medicare HMO

## 2020-03-21 ENCOUNTER — Inpatient Hospital Stay: Admission: RE | Admit: 2020-03-21 | Payer: Medicare HMO | Source: Ambulatory Visit

## 2020-03-28 ENCOUNTER — Other Ambulatory Visit: Payer: Medicare HMO

## 2020-04-18 ENCOUNTER — Other Ambulatory Visit: Payer: Self-pay

## 2020-04-18 ENCOUNTER — Ambulatory Visit (INDEPENDENT_AMBULATORY_CARE_PROVIDER_SITE_OTHER)
Admission: RE | Admit: 2020-04-18 | Discharge: 2020-04-18 | Disposition: A | Discharge status: Home / Self Care | Payer: Self-pay | Source: Ambulatory Visit | Attending: Cardiovascular Disease | Admitting: Cardiovascular Disease

## 2020-04-18 DIAGNOSIS — E785 Hyperlipidemia, unspecified: Secondary | ICD-10-CM

## 2020-04-18 DIAGNOSIS — I10 Essential (primary) hypertension: Secondary | ICD-10-CM

## 2020-04-30 ENCOUNTER — Ambulatory Visit: Payer: Medicare HMO | Admitting: Nurse Practitioner

## 2020-05-02 ENCOUNTER — Ambulatory Visit (INDEPENDENT_AMBULATORY_CARE_PROVIDER_SITE_OTHER): Payer: Medicare HMO

## 2020-05-02 ENCOUNTER — Telehealth: Payer: Medicare HMO

## 2020-05-02 DIAGNOSIS — E782 Mixed hyperlipidemia: Secondary | ICD-10-CM | POA: Diagnosis not present

## 2020-05-02 DIAGNOSIS — I10 Essential (primary) hypertension: Secondary | ICD-10-CM | POA: Diagnosis not present

## 2020-05-02 DIAGNOSIS — K219 Gastro-esophageal reflux disease without esophagitis: Secondary | ICD-10-CM

## 2020-05-03 ENCOUNTER — Encounter: Payer: Self-pay | Admitting: *Deleted

## 2020-05-03 NOTE — Telephone Encounter (Signed)
This encounter was created in error - please disregard.

## 2020-05-06 NOTE — Chronic Care Management (AMB) (Signed)
Chronic Care Management   CCM RN Visit Note  05/06/2020 Name: Wendy Mosley MRN: [id number] DOB: Feb 21, 1951  Subjective: Wendy Mosley is a 70 y.o. year old female who is a primary care patient of [name], [place]. The care management team was consulted for assistance with disease management and care coordination needs.    Engaged with patient by telephone for follow up visit in response to provider referral for case management and/or care coordination services.   Consent to Services:  The patient was given information about Chronic Care Management services, agreed to services, and gave verbal consent prior to initiation of services.  Please see initial visit note for detailed documentation.   Patient agreed to services and verbal consent obtained.   Assessment: Review of patient past medical history, allergies, medications, health status, including review of consultants reports, laboratory and other test data, was performed as part of comprehensive evaluation and provision of chronic care management services.   SDOH (Social Determinants of Health) assessments and interventions performed:  No  CCM Care Plan  Allergies  Allergen Reactions  . Sulfonamide Derivatives     Rash/itching     Outpatient Encounter Medications as of 05/02/2020  Medication Sig  . aspirin 81 MG tablet Take 81 mg by mouth every other day.   . B COMPLEX-ZINC PO Take 1 tablet by mouth daily.  . clobetasol cream (TEMOVATE) 0.05 % APPLY TO AFFECTED AREA TWICE A DAY AS NEEDED  . dexlansoprazole (DEXILANT) 60 MG capsule Take 1 capsule (60 mg total) by mouth daily.  . diclofenac Sodium (VOLTAREN) 1 % GEL diclofenac 1 % topical gel  . ibuprofen (ADVIL,MOTRIN) 200 MG tablet Take 400-600 mg by mouth every 6 (six) hours as needed for moderate pain.   No facility-administered encounter medications on file as of 05/02/2020.    Patient Active Problem List   Diagnosis Date Noted  . Palpitations 11/30/2019  .  Constipation 09/15/2018  . Colonic polyp 12/24/2016  . Hyperlipidemia 11/14/2015  . HTN (hypertension) 11/12/2015  . Arthritis of right knee 11/12/2015  . Dermatitis 11/12/2015  . Allergic rhinitis 07/27/2012  . Lichen sclerosus et atrophicus of the vulva 07/12/2011  . GERD (gastroesophageal reflux disease) 07/02/2011  . Postmenopausal atrophic vaginitis 07/02/2011    Conditions to be addressed/monitored:HTN, HLD, GERD  Care Plan : Depression (Adult)  Updates made by Lynne Logan, RN since 05/06/2020 12:00 AM    Problem: Symptoms (Depression)   Priority: High    Long-Range Goal: Grief - Symptoms Monitored and Managed   Start Date: 05/02/2020  Expected End Date: 11/01/2020  This Visit's Progress: On track  Priority: High  Note:   Current Barriers:   Ineffective Self Health Maintenance Clinical Goal(s):  [name] Collaboration with [name], FNP regarding development and update of comprehensive plan of care as evidenced by provider attestation and co-signature . Inter-disciplinary care team collaboration (see longitudinal plan of care)  patient will work with care management team to address care coordination and chronic disease management needs related to Disease Management  Educational Needs  Care Coordination  Medication Management and Education  Psychosocial Support   Interventions:   Evaluation of current treatment plan related to depression self-management and patient's adherence to plan as established by provider.  Collaboration with [name], FNP regarding development and update of comprehensive plan of care as evidenced by provider attestation       and co-signature  Inter-disciplinary care team collaboration (see longitudinal plan of care)  Determined patient is experiencing  depression following the loss of her daughter to stage IV lung cancer  Determined patient is currently using distraction, prayer and family support to help her cope   Educated patient  on the option for grief counseling through [place] and instructed patient to call if services are needed, provided patient with the contact information for the local office   Discussed plans with patient for ongoing care management follow up and provided patient with direct contact information for care management team Patient [place] Activities:  - Follow up with [facility] if needed - Notify PCP of persistent or worsening signs of depression and or anxiety - Continue to use prayer, medication, distraction and family support to help with coping  Follow Up Plan: Telephone follow up appointment with care management team member scheduled for: 06/07/20    Plan:Telephone follow up appointment with care management team member scheduled for:  06/07/20  Barb Merino, RN, BSN, CCM Care Management Coordinator [facility]  Direct Phone: [phone]

## 2020-05-11 ENCOUNTER — Other Ambulatory Visit: Payer: Self-pay | Admitting: Nurse Practitioner

## 2020-05-11 DIAGNOSIS — L309 Dermatitis, unspecified: Secondary | ICD-10-CM

## 2020-05-15 ENCOUNTER — Other Ambulatory Visit: Payer: Self-pay

## 2020-05-15 ENCOUNTER — Encounter: Payer: Self-pay | Admitting: Nurse Practitioner

## 2020-05-15 ENCOUNTER — Ambulatory Visit (INDEPENDENT_AMBULATORY_CARE_PROVIDER_SITE_OTHER): Payer: Medicare HMO | Admitting: Nurse Practitioner

## 2020-05-15 VITALS — BP 122/78 | HR 73 | Temp 98.0°F | Ht 63.6 in | Wt 169.0 lb

## 2020-05-15 DIAGNOSIS — G8929 Other chronic pain: Secondary | ICD-10-CM | POA: Diagnosis not present

## 2020-05-15 DIAGNOSIS — I10 Essential (primary) hypertension: Secondary | ICD-10-CM

## 2020-05-15 DIAGNOSIS — L299 Pruritus, unspecified: Secondary | ICD-10-CM

## 2020-05-15 DIAGNOSIS — Z87891 Personal history of nicotine dependence: Secondary | ICD-10-CM

## 2020-05-15 DIAGNOSIS — F4321 Adjustment disorder with depressed mood: Secondary | ICD-10-CM

## 2020-05-15 DIAGNOSIS — M25561 Pain in right knee: Secondary | ICD-10-CM

## 2020-05-15 DIAGNOSIS — M25511 Pain in right shoulder: Secondary | ICD-10-CM | POA: Diagnosis not present

## 2020-05-15 DIAGNOSIS — E782 Mixed hyperlipidemia: Secondary | ICD-10-CM

## 2020-05-15 MED ORDER — DICLOFENAC SODIUM 1 % EX GEL: 2.0000 g | Freq: Four times a day (QID) | CUTANEOUS | 3 refills | Status: AC

## 2020-05-15 MED ORDER — HYDROXYZINE PAMOATE 25 MG PO CAPS
25.0000 mg | ORAL_CAPSULE | Freq: Three times a day (TID) | ORAL | 0 refills | Status: DC | PRN
Start: 2020-05-15 — End: 2021-05-22

## 2020-05-15 NOTE — Progress Notes (Signed)
I,Yamilka Roman [employer] as a Education administrator for [employer], FNP.,have documented all relevant documentation on the behalf of [name], FNP,as directed by  [name], FNP while in the presence of [name], [place]. This visit occurred during the SARS-CoV-2 public health emergency.  Safety protocols were in place, including screening questions prior to the visit, additional usage of staff PPE, and extensive cleaning of exam room while observing appropriate contact time as indicated for disinfecting solutions.  Subjective:     Patient ID: Wendy Mosley , female    DOB: June 02, 1950 , 70 y.o.   MRN: [id number]   Chief Complaint  Patient presents with  . Hypertension    HPI  Patient presents today for a blood pressure check.  Denies any concerns. Her daughter passed away in 2022-03-17. Has been more stressed out. She will have a sharp pain to her head at times briefly. She has not been exercising as much. Seen Dr. Oval Linsey in 03-17-22 she had a cardiac scoring CT scan.   Wt Readings from Last 3 Encounters: 05/15/20 : 169 lb (76.7 kg) 03/07/20 : 168 lb 9.6 oz (76.5 kg) 11/30/19 : 168 lb (76.2 kg)  She continues to have right knee pain after she  She is also having itching all over worse when she is stressed or worried.  Hypertension This is a chronic problem. The current episode started more than 1 year ago. The problem is unchanged. The problem is controlled. Pertinent negatives include no anxiety, chest pain, headaches or palpitations. There are no associated agents to hypertension. Risk factors for coronary artery disease include sedentary lifestyle. Past treatments include nothing. Compliance problems include exercise and diet.  There is no history of chronic renal disease.     Past Medical History:  Diagnosis Date  . Cataracts, bilateral    right eye - just watching  . Diverticulitis   . GERD (gastroesophageal reflux disease) 2006  . Hearing loss    right ear - no hearing aid   . Heart attack ([place]) 2008  . Hypertension   . Irregular heart beat 1973  . Palpitations 11/30/2019  . SVD (spontaneous vaginal delivery)    x 3     Family History  Problem Relation Age of Onset  . Asthma Mother   . Stroke Mother   . COPD Mother   . Heart failure Mother   . Diabetes Sister   . Cancer Maternal Grandmother        Lukemia  . Breast cancer Neg Hx      Current Outpatient Medications:  .  aspirin 81 MG tablet, Take 81 mg by mouth every other day. , Disp: , Rfl:  .  B COMPLEX-ZINC PO, Take 1 tablet by mouth daily., Disp: , Rfl:  .  clobetasol cream (TEMOVATE) 0.05 %, APPLY TO AFFECTED AREA TWICE A DAY AS NEEDED, Disp: 30 g, Rfl: 0 .  dexlansoprazole (DEXILANT) 60 MG capsule, Take 1 capsule (60 mg total) by mouth daily., Disp: 30 capsule, Rfl: 2 .  hydrOXYzine (VISTARIL) 25 MG capsule, Take 1 capsule (25 mg total) by mouth 3 (three) times daily as needed., Disp: 30 capsule, Rfl: 0 .  ibuprofen (ADVIL,MOTRIN) 200 MG tablet, Take 400-600 mg by mouth every 6 (six) hours as needed for moderate pain., Disp: , Rfl:  .  atorvastatin (LIPITOR) 20 MG tablet, Take 1 tablet (20 mg total) by mouth daily., Disp: 30 tablet, Rfl: 2 .  diclofenac Sodium (VOLTAREN) 1 % GEL, Apply 2 g topically 4 (  four) times daily., Disp: 100 g, Rfl: 3   Allergies  Allergen Reactions  . Sulfonamide Derivatives     Rash/itching      Review of Systems  Constitutional: Negative.   HENT: Negative.   Eyes: Negative.   Respiratory: Negative.   Cardiovascular: Negative.  Negative for chest pain and palpitations.  Gastrointestinal: Negative.   Endocrine: Negative.   Genitourinary: Negative.   Musculoskeletal: Negative.   Skin: Negative.   Neurological: Negative.  Negative for headaches.  Hematological: Negative.   Psychiatric/Behavioral: Negative.      Today's Vitals   05/15/20 1000  BP: 122/78  Pulse: 73  Temp: 98 F (36.7 C)  TempSrc: Oral  Weight: 169 lb (76.7 kg)  Height: 5' 3.6"  (1.615 m)  PainSc: 0-No pain   Body mass index is 29.37 kg/m.   Objective:  Physical Exam Constitutional:      General: She is not in acute distress.    Appearance: Normal appearance. She is normal weight.  Cardiovascular:     Rate and Rhythm: Normal rate and regular rhythm.     Pulses: Normal pulses.     Heart sounds: Normal heart sounds.  Pulmonary:     Effort: Pulmonary effort is normal.     Breath sounds: Normal breath sounds.  Abdominal:     General: Abdomen is flat. Bowel sounds are normal.     Palpations: Abdomen is soft.  Musculoskeletal:        General: Normal range of motion.     Cervical back: Normal range of motion and neck supple.  Skin:    General: Skin is warm and dry.     Capillary Refill: Capillary refill takes less than 2 seconds.  Neurological:     General: No focal deficit present.     Mental Status: She is alert and oriented to person, place, and time.  Psychiatric:        Mood and Affect: Mood normal.        Behavior: Behavior normal.        Thought Content: Thought content normal.        Judgment: Judgment normal.         Assessment And Plan:     1. Essential hypertension  Chronic,   Blood pressure is well controlled  No current medications, she is diet controlled and encouraged to exercise regularly - CMP14+EGFR  2. Chronic pain of right knee  Negative for pain on palpation  Negative drawer test  She will try the diclofenac gel as needed if not improved will consider referral to orthopedics - diclofenac Sodium (VOLTAREN) 1 % GEL; Apply 2 g topically 4 (four) times daily.  Dispense: 100 g; Refill: 3  3. Mixed hyperlipidemia  Chronic, stable  Will check lipid panel  Encouraged to avoid fatty and processed foods. - Lipid panel - CMP14+EGFR  4. Pruritus  She has been having pruritis all over, negative for rash  Will treat with hydroxyzine - hydrOXYzine (VISTARIL) 25 MG capsule; Take 1 capsule (25 mg total) by mouth 3  (three) times daily as needed.  Dispense: 30 capsule; Refill: 0  5. Chronic right shoulder pain  Negative for hawkins and neers  She can use the diclofenac to her shoulder as well  Bursitis vs arthritis  6. Grief  Her daughter has passed from lung cancer and she would like to seek grief counseling - Ambulatory referral to Psychology  7. History of smoking 10-25 pack years  She is a former 47  Pack per year smoker, has never had a CT lung scan  - CT CHEST LUNG CA SCREEN LOW DOSE W/O CM; Future     Patient was given opportunity to ask questions. Patient verbalized understanding of the plan and was able to repeat key elements of the plan. All questions were answered to their satisfaction.  [name], FNP   I, [name], FNP, have reviewed all documentation for this visit. The documentation on 05/14/20 for the exam, diagnosis, procedures, and orders are all accurate and complete.   IF YOU HAVE BEEN REFERRED TO A SPECIALIST, IT MAY TAKE 1-2 WEEKS TO SCHEDULE/PROCESS THE REFERRAL. IF YOU HAVE NOT HEARD FROM US/SPECIALIST IN TWO WEEKS, PLEASE GIVE [place] A CALL AT [phone] X [phone].   THE PATIENT IS ENCOURAGED TO PRACTICE SOCIAL DISTANCING DUE TO THE COVID-19 PANDEMIC.

## 2020-05-15 NOTE — Patient Instructions (Addendum)

## 2020-05-16 ENCOUNTER — Other Ambulatory Visit: Payer: Self-pay | Admitting: Nurse Practitioner

## 2020-05-16 DIAGNOSIS — E782 Mixed hyperlipidemia: Secondary | ICD-10-CM

## 2020-05-16 LAB — CMP14+EGFR
ALT: 22 IU/L (ref 0–32)
AST: 21 IU/L (ref 0–40)
Albumin/Globulin Ratio: 1.4 (ref 1.2–2.2)
Albumin: 4.5 g/dL (ref 3.8–4.8)
Alkaline Phosphatase: 139 IU/L — ABNORMAL HIGH (ref 44–121)
BUN/Creatinine Ratio: 10 — ABNORMAL LOW (ref 12–28)
BUN: 9 mg/dL (ref 8–27)
Bilirubin Total: 0.5 mg/dL (ref 0.0–1.2)
CO2: 26 mmol/L (ref 20–29)
Calcium: 9.6 mg/dL (ref 8.7–10.3)
Chloride: 102 mmol/L (ref 96–106)
Creatinine, Ser: 0.9 mg/dL (ref 0.57–1.00)
Globulin, Total: 3.2 g/dL (ref 1.5–4.5)
Glucose: 96 mg/dL (ref 65–99)
Potassium: 4.5 mmol/L (ref 3.5–5.2)
Sodium: 142 mmol/L (ref 134–144)
Total Protein: 7.7 g/dL (ref 6.0–8.5)
eGFR: 69 mL/min/{1.73_m2} (ref >59)

## 2020-05-16 LAB — LIPID PANEL
Chol/HDL Ratio: 5.8 ratio — ABNORMAL HIGH (ref 0.0–4.4)
Cholesterol, Total: 254 mg/dL — ABNORMAL HIGH (ref 100–199)
HDL: 44 mg/dL (ref >39)
LDL Chol Calc (NIH): 187 mg/dL — ABNORMAL HIGH (ref 0–99)
Triglycerides: 127 mg/dL (ref 0–149)
VLDL Cholesterol Cal: 23 mg/dL (ref 5–40)

## 2020-05-16 MED ORDER — ATORVASTATIN CALCIUM 20 MG PO TABS: 20.0000 mg | ORAL_TABLET | Freq: Every day | ORAL | 2 refills | Status: DC

## 2020-05-19 ENCOUNTER — Encounter: Payer: Self-pay | Admitting: Nurse Practitioner

## 2020-06-06 ENCOUNTER — Ambulatory Visit (INDEPENDENT_AMBULATORY_CARE_PROVIDER_SITE_OTHER): Payer: Medicare HMO | Admitting: Cardiovascular Disease

## 2020-06-06 ENCOUNTER — Encounter: Payer: Self-pay | Admitting: Cardiovascular Disease

## 2020-06-06 ENCOUNTER — Other Ambulatory Visit: Payer: Self-pay

## 2020-06-06 VITALS — BP 130/80 | HR 67 | Ht 64.0 in | Wt 167.0 lb

## 2020-06-06 DIAGNOSIS — R002 Palpitations: Secondary | ICD-10-CM

## 2020-06-06 DIAGNOSIS — I2510 Atherosclerotic heart disease of native coronary artery without angina pectoris: Secondary | ICD-10-CM

## 2020-06-06 DIAGNOSIS — I10 Essential (primary) hypertension: Secondary | ICD-10-CM

## 2020-06-06 DIAGNOSIS — E7800 Pure hypercholesterolemia, unspecified: Secondary | ICD-10-CM

## 2020-06-06 HISTORY — DX: Atherosclerotic heart disease of native coronary artery without angina pectoris: I25.10

## 2020-06-06 NOTE — Assessment & Plan Note (Addendum)
CAD native.  Calcium score 176, 85th percentile 04/2020.  Continue aspirin.

## 2020-06-06 NOTE — Patient Instructions (Signed)
Medication Instructions:  Your physician recommends that you continue on your current medications as directed. Please refer to the Current Medication list given to you today.   *If you need a refill on your cardiac medications before your next appointment, please call your pharmacy*  Lab Work: FASTING LP/CMET AROUND July 4th  If you have labs (blood work) drawn today and your tests are completely normal, you will receive your results only by: [name] MyChart Message (if you have MyChart) OR . A paper copy in the mail If you have any lab test that is abnormal or we need to change your treatment, we will call you to review the results.  Testing/Procedures: NONE   Follow-Up: At [facility], you and your health needs are our priority.  As part of our continuing mission to provide you with exceptional heart care, we have created designated Provider Care Teams.  These Care Teams include your primary Cardiologist (physician) and Advanced Practice Providers (APPs -  Physician Assistants and Nurse Practitioners) who all work together to provide you with the care you need, when you need it.  We recommend signing up for the patient portal called "MyChart".  Sign up information is provided on this After Visit Summary.  MyChart is used to connect with patients for Virtual Visits (Telemedicine).  Patients are able to view lab/test results, encounter notes, upcoming appointments, etc.  Non-urgent messages can be sent to your provider as well.   To learn more about what you can do with MyChart, go to [email or web address].    Your next appointment:   12 month(s)  The format for your next appointment:   In Person  Provider:   DR Guthrie NP/PA AT [place]   Other Instructions  SOMEONE FROM [place]

## 2020-06-06 NOTE — Assessment & Plan Note (Addendum)
Improved. Likely stress related from her daughter's illness and death.

## 2020-06-06 NOTE — Assessment & Plan Note (Signed)
LDL goal <70 for calcium score 85th percentile.  Continue atorvastatin.  Check lipids/CMP 08/2020.

## 2020-06-06 NOTE — Progress Notes (Addendum)
Cardiology Office Note   Date:  06/06/2020   ID:  Wendy Mosley, DOB 04-Nov-1950, MRN [id number]  PCP:  [name], FNP  Cardiologist:   Skeet Latch, MD   No chief complaint on file.    History of Present Illness: Wendy Mosley is a 70 y.o. femalewith hypertension, hyperlipidemia, and prior tobacco abuse who is being seen today for the evaluation of palpitations at the request of [name], [place].  She saw Wendy Mosley on 10/25/2019 he reported episodes of racing heart.  EKG at the time revealed sinus rhythm with no arrhythmias.  Thyroid function, CMP and CBC were unremarkable.  One night she laid down on the sofa and her heart started racing. It wouldn't stop with positional changes.  The episode lasted for several minutes and she felt short of breath.  She noted slight pain in her head at the time.  She also noted some lightheadedness and mild nausea.   Her blood pressure and cholesterol are poorly controlled.  However she was hesitant to start any medications.  She tried several medications in the past and didn't tolerate them.    She wanted to work on diet and exercise.    Wendy Mosley is depressed about her daughter dying from cancer 03/2020. She is able to talk with her husband and a friend who lost her husband for support. She is hoping to find a group therapy activity with help from her Education officer, museum.  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 She also experiences dizziness when a bus will pull up beside her, and fears she may have vertigo. She is not dizzy when moving her head or when standing.  She enjoys occupying her time with art, including still-life drawings and making yard furniture out of trees with her husband. She takes 1 atorvastatin in the evening before bed when she remembers. She does not report any symptoms while taking her medication. She checks her at home blood pressure twice a week, and it has ranged in the 130s-140s. She  walks whenever she is able, but does not participate in formal exercise due to her right knee pain. She plans to go to the [facility] downtown with family and try water therapy activities and formal exercise.  She does not have LE edema, chest pain or tightness, orthopnea, or PND.    Past Medical History:  Diagnosis Date  . CAD in native artery 06/06/2020   CAD native.  Calcium score 176, 85th percentile 04/2020  . Cataracts, bilateral    right eye - just watching  . Diverticulitis   . GERD (gastroesophageal reflux disease) 2006  . Hearing loss    right ear - no hearing aid  . Heart attack ([place]) 2008  . Hypertension   . Irregular heart beat 1973  . Palpitations 11/30/2019  . SVD (spontaneous vaginal delivery)    x 3    Past Surgical History:  Procedure Laterality Date  . APPENDECTOMY  1967  . BREAST SURGERY     cyst removed from right breast at age 36  . CHOLECYSTECTOMY  1975  . COLONOSCOPY    . DILATATION & CURETTAGE/HYSTEROSCOPY WITH MYOSURE N/A 02/21/2018   Procedure: DILATATION & CURETTAGE/HYSTEROSCOPY WITH MYOSURE RESECTION OF ENDOMETRIAL POLYP;  Surgeon: Servando Salina, MD;  Location: [facility];  Service: Gynecology;  Laterality: N/A;  . EXTERNAL EAR SURGERY  1974  . [place]     Current Outpatient  Medications  Medication Sig Dispense Refill  . aspirin 81 MG tablet Take 81 mg by mouth every other day.     [name] atorvastatin (LIPITOR) 20 MG tablet Take 1 tablet (20 mg total) by mouth daily. 30 tablet 2  . B COMPLEX-ZINC PO Take 1 tablet by mouth daily.    . clobetasol cream (TEMOVATE) 0.05 % APPLY TO AFFECTED AREA TWICE A DAY AS NEEDED 30 g 0  . dexlansoprazole (DEXILANT) 60 MG capsule Take 1 capsule (60 mg total) by mouth daily. 30 capsule 2  . diclofenac Sodium (VOLTAREN) 1 % GEL Apply 2 g topically 4 (four) times daily. 100 g 3  . hydrOXYzine (VISTARIL) 25 MG capsule Take 1 capsule (25 mg total) by mouth 3 (three) times daily as needed. 30 capsule 0  . ibuprofen  (ADVIL,MOTRIN) 200 MG tablet Take 400-600 mg by mouth every 6 (six) hours as needed for moderate pain.     No current facility-administered medications for this visit.    Allergies:   Sulfonamide derivatives    Social History:  The patient  reports that she quit smoking about 9 years ago. Her smoking use included cigarettes. She has a 30.00 pack-year smoking history. She has never used smokeless tobacco. She reports previous alcohol use. She reports that she does not use drugs.   Family History:  The patient's family history includes Asthma in her mother; COPD in her mother; Cancer in her maternal grandmother; Diabetes in her sister; Heart failure in her mother; Stroke in her mother.    ROS:  Please see the history of present illness.   Otherwise, review of systems are positive for right knee pain and dizziness.   All other systems are reviewed and negative.    PHYSICAL EXAM: VS:  BP 130/80   Pulse 67   Ht 5\' 4"  (1.626 m)   Wt 167 lb (75.8 kg)   SpO2 95%   BMI 28.67 kg/m  , BMI Body mass index is 28.67 kg/m. GENERAL:  Well appearing HEENT:  Pupils equal round and reactive, fundi not visualized, oral mucosa unremarkable NECK:  No jugular venous distention, waveform within normal limits, carotid upstroke brisk and symmetric, no bruits LUNGS:  Clear to auscultation bilaterally HEART:  RRR.  PMI not displaced or sustained,S1 and S2 within normal limits, no S3, no S4, no clicks, no rubs, no  murmurs ABD:  Flat, positive bowel sounds normal in frequency in pitch, no bruits, no rebound, no guarding, no midline pulsatile mass, no hepatomegaly, no splenomegaly EXT:  2 plus pulses throughout, no edema, no cyanosis no clubbing SKIN:  No rashes no nodules NEURO:  Cranial nerves II through XII grossly intact, motor grossly intact throughout PSYCH:  Cognitively intact, oriented to person place and time   EKG:   06/06/2020: sinus rhythm.  Rate 67  bpm 11/30/2019: sinus rhythm.  Rate 72 bpm.   Non-specific T wave abnormalities.  04/18/20 Coronary calcium score IMPRESSION: Coronary calcium score of 176. This was 85th percentile for age and sex matched control.  Recommend aggressive risk factor modification, including LDL goal <70.  Recent Labs: 10/25/2019: Hemoglobin 12.1; Platelets 348; TSH 1.460 05/15/2020: ALT 22; BUN 9; Creatinine, Ser 0.90; Potassium 4.5; Sodium 142    Lipid Panel    Component Value Date/Time   CHOL 254 (H) 05/15/2020 1141   TRIG 127 05/15/2020 1141   HDL 44 05/15/2020 1141   CHOLHDL 5.8 (H) 05/15/2020 1141   CHOLHDL 4.5 11/12/2015 1138   VLDL 17 11/12/2015  1138   LDLCALC 187 (H) 05/15/2020 1141   LDLDIRECT 138 (H) 07/02/2011 1521      Wt Readings from Last 3 Encounters:  06/06/20 167 lb (75.8 kg)  05/15/20 169 lb (76.7 kg)  03/07/20 168 lb 9.6 oz (76.5 kg)     ASSESSMENT AND PLAN: CAD in native artery CAD native.  Calcium score 176, 85th percentile 04/2020.  Continue aspirin.  HTN (hypertension) BP stable.  Encouraged increased exercise to 150 minutes weekly  Palpitations Improved. Likely stress related from her daughter's illness and death.  Hyperlipidemia LDL goal <70 for calcium score 85th percentile.  Continue atorvastatin.  Check lipids/CMP 08/2020.    Current medicines are reviewed at length with the patient today.  The patient does not have concerns regarding medicines.  The following changes have been made:  no change  Labs/ tests ordered today include:   Orders Placed This Encounter  Procedures  . Lipid panel  . Comprehensive metabolic panel  . EKG 12-Lead    Disposition:   FU with Kwan Shellhammer C. Oval Linsey, MD, [facility] in 1 year.    I,Mathew Stumpf,acting as a Education administrator for Skeet Latch, MD.,have documented all relevant documentation on the behalf of Skeet Latch, MD,as directed by  Skeet Latch, MD while in the presence of Skeet Latch, MD.   Signed, Coulter. Oval Linsey, MD, [facility]  06/06/2020 9:57 AM     [facility]

## 2020-06-06 NOTE — Assessment & Plan Note (Signed)
BP stable.  Encouraged increased exercise to 150 minutes weekly

## 2020-06-07 ENCOUNTER — Telehealth: Payer: Medicare HMO

## 2020-06-12 ENCOUNTER — Other Ambulatory Visit: Payer: Self-pay

## 2020-06-12 ENCOUNTER — Encounter: Payer: Self-pay | Admitting: Nurse Practitioner

## 2020-06-12 ENCOUNTER — Telehealth (INDEPENDENT_AMBULATORY_CARE_PROVIDER_SITE_OTHER): Payer: Medicare HMO | Admitting: Nurse Practitioner

## 2020-06-12 VITALS — BP 143/89 | HR 90 | Temp 97.4°F | Ht 64.0 in | Wt 167.0 lb

## 2020-06-12 DIAGNOSIS — Z1152 Encounter for screening for COVID-19: Secondary | ICD-10-CM

## 2020-06-12 DIAGNOSIS — R067 Sneezing: Secondary | ICD-10-CM | POA: Diagnosis not present

## 2020-06-12 DIAGNOSIS — M25511 Pain in right shoulder: Secondary | ICD-10-CM | POA: Diagnosis not present

## 2020-06-12 DIAGNOSIS — Z20822 Contact with and (suspected) exposure to covid-19: Secondary | ICD-10-CM | POA: Diagnosis not present

## 2020-06-12 DIAGNOSIS — M2550 Pain in unspecified joint: Secondary | ICD-10-CM | POA: Diagnosis not present

## 2020-06-12 DIAGNOSIS — M25561 Pain in right knee: Secondary | ICD-10-CM

## 2020-06-12 DIAGNOSIS — G8929 Other chronic pain: Secondary | ICD-10-CM

## 2020-06-12 DIAGNOSIS — R059 Cough, unspecified: Secondary | ICD-10-CM

## 2020-06-12 LAB — POC COVID19 BINAXNOW: SARS Coronavirus 2 Ag: NEGATIVE

## 2020-06-12 LAB — POC INFLUENZA A&B (BINAX/QUICKVUE)
Influenza A, POC: NEGATIVE
Influenza B, POC: NEGATIVE

## 2020-06-12 MED ORDER — BENZONATATE 100 MG PO CAPS: 100.0000 mg | ORAL_CAPSULE | Freq: Four times a day (QID) | ORAL | 1 refills | Status: DC | PRN

## 2020-06-12 MED ORDER — FLUTICASONE PROPIONATE 50 MCG/ACT NA SUSP: 2.0000 | Freq: Every day | NASAL | 2 refills | Status: DC

## 2020-06-12 MED ORDER — LORATADINE 10 MG PO TABS: 10.0000 mg | ORAL_TABLET | Freq: Every day | ORAL | 1 refills | Status: DC

## 2020-06-12 NOTE — Progress Notes (Signed)
Virtual Visit via [employer] Roman Lebron,acting as a Education administrator for [name], FNP.,have documented all relevant documentation on the behalf of [name], FNP,as directed by  [name], FNP while in the presence of [name], [place]. This visit type was conducted due to national recommendations for restrictions regarding the COVID-19 Pandemic (e.g. social distancing) in an effort to limit this patient's exposure and mitigate transmission in our community.  Due to her co-morbid illnesses, this patient is at least at moderate risk for complications without adequate follow up.  This format is felt to be most appropriate for this patient at this time.  All issues noted in this document were discussed and addressed.  A limited physical exam was performed with this format.    This visit type was conducted due to national recommendations for restrictions regarding the COVID-19 Pandemic (e.g. social distancing) in an effort to limit this patient's exposure and mitigate transmission in our community.  Patients identity confirmed using two different identifiers.  This format is felt to be most appropriate for this patient at this time.  All issues noted in this document were discussed and addressed.  No physical exam was performed (except for noted visual exam findings with Video Visits).    Date:  06/12/2020   ID:  Wendy Mosley, DOB September 01, 1950, MRN [id number]  Patient Location:  Home - spoke with Wendy Mosley  Provider location:   Office    Chief Complaint:  Cold symptoms  History of Present Illness:    Wendy Mosley is a 70 y.o. female who presents via video conferencing for a telehealth visit today.    The patient does have symptoms concerning for COVID-19 infection (fever, chills, cough, or new shortness of breath).   Video visit due to sneezing, hacking cough, runny nose, and sinus pressure. She is working as a Geophysicist/field seismologist for the bus.   URI  This is a new problem. The current  episode started in the past 7 days (1 week ago). There has been no fever. Associated symptoms include rhinorrhea, sinus pain, sneezing and a sore throat. Pertinent negatives include no abdominal pain, congestion, dysuria, headaches or nausea. She has tried NSAIDs (robitussin cough syrup) for the symptoms. The treatment provided mild relief.     Past Medical History:  Diagnosis Date  . CAD in native artery 06/06/2020   CAD native.  Calcium score 176, 85th percentile 04/2020  . Cataracts, bilateral    right eye - just watching  . Diverticulitis   . GERD (gastroesophageal reflux disease) 2006  . Hearing loss    right ear - no hearing aid  . Heart attack ([place]) 2008  . Hypertension   . Irregular heart beat 1973  . Palpitations 11/30/2019  . SVD (spontaneous vaginal delivery)    x 3   Past Surgical History:  Procedure Laterality Date  . APPENDECTOMY  1967  . BREAST SURGERY     cyst removed from right breast at age 81  . CHOLECYSTECTOMY  1975  . COLONOSCOPY    . DILATATION & CURETTAGE/HYSTEROSCOPY WITH MYOSURE N/A 02/21/2018   Procedure: DILATATION & CURETTAGE/HYSTEROSCOPY WITH MYOSURE RESECTION OF ENDOMETRIAL POLYP;  Surgeon: Servando Salina, MD;  Location: [facility];  Service: Gynecology;  Laterality: N/A;  . EXTERNAL EAR SURGERY  1974  . TUBAL LIGATION  1977     No outpatient medications have been marked as taking for the 06/12/20 encounter (Video Visit) with [name], [place].     Allergies:  Sulfonamide derivatives   Social History   Tobacco Use  . Smoking status: Former Smoker    Packs/day: 1.00    Years: 30.00    Pack years: 30.00    Types: Cigarettes    Quit date: 02/01/2011    Years since quitting: 9.3  . Smokeless tobacco: Never Used  Vaping Use  . Vaping Use: Never used  Substance Use Topics  . Alcohol use: Not Currently    Comment: occasional glass of wine  . Drug use: No    Comment: No IV drug     Family Hx: The patient's family history includes Asthma in  her mother; COPD in her mother; Cancer in her maternal grandmother; Diabetes in her sister; Heart failure in her mother; Stroke in her mother. There is no history of Breast cancer.  ROS:   Please see the history of present illness.    Review of Systems  Constitutional: Negative.   HENT: Positive for rhinorrhea, sinus pain, sneezing and sore throat. Negative for congestion.   Eyes: Negative for pain and discharge.  Respiratory: Negative.   Cardiovascular: Negative.   Gastrointestinal: Negative for abdominal pain and nausea.  Genitourinary: Negative for dysuria.  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 She is also having shoulder pain  Neurological: Negative for headaches.  Psychiatric/Behavioral: Negative.     All other systems reviewed and are negative.   Labs/Other Tests and Data Reviewed:    Recent Labs: 10/25/2019: Hemoglobin 12.1; Platelets 348; TSH 1.460 05/15/2020: ALT 22; BUN 9; Creatinine, Ser 0.90; Potassium 4.5; Sodium 142   Recent Lipid Panel Lab Results  Component Value Date/Time   CHOL 254 (H) 05/15/2020 11:41 AM   TRIG 127 05/15/2020 11:41 AM   HDL 44 05/15/2020 11:41 AM   CHOLHDL 5.8 (H) 05/15/2020 11:41 AM   CHOLHDL 4.5 11/12/2015 11:38 AM   LDLCALC 187 (H) 05/15/2020 11:41 AM   LDLDIRECT 138 (H) 07/02/2011 03:21 PM    Wt Readings from Last 3 Encounters:  06/06/20 167 lb (75.8 kg)  05/15/20 169 lb (76.7 kg)  03/07/20 168 lb 9.6 oz (76.5 kg)     Exam:    Vital Signs:  There were no vitals taken for this visit.    Physical Exam Vitals reviewed.  Constitutional:      General: She is not in acute distress.    Appearance: She is obese.  Pulmonary:     Effort: Pulmonary effort is normal. No respiratory distress.     Breath sounds: Normal breath sounds.  Neurological:     General: No focal deficit present.     Mental Status: She is alert and oriented to person, place, and time.     Cranial Nerves: No cranial  nerve deficit.  Psychiatric:        Mood and Affect: Mood normal.        Behavior: Behavior normal.        Thought Content: Thought content normal.        Judgment: Judgment normal.     ASSESSMENT & PLAN:    1. Sneezing She is to take claritin and flonase this is likely related to the pollen - loratadine (CLARITIN) 10 MG tablet; Take 1 tablet (10 mg total) by mouth daily.  Dispense: 90 tablet; Refill: 1 - fluticasone (FLONASE) 50 MCG/ACT nasal spray; Place 2 sprays into both nostrils daily.  Dispense: 16 g; Refill: 2  2. Cough She will come to the  office tomorrow for a covid test outside She is to use tessalon perles for cough - loratadine (CLARITIN) 10 MG tablet; Take 1 tablet (10 mg total) by mouth daily.  Dispense: 90 tablet; Refill: 1 - fluticasone (FLONASE) 50 MCG/ACT nasal spray; Place 2 sprays into both nostrils daily.  Dispense: 16 g; Refill: 2 - benzonatate (TESSALON PERLES) 100 MG capsule; Take 1 capsule (100 mg total) by mouth every 6 (six) hours as needed.  Dispense: 30 capsule; Refill: 1 - POC COVID-19 - POC Influenza A&B(BINAX/QUICKVUE)  3. Arthralgia, unspecified joint Will check knee and shoulder xray due to persistent joing pain  4. Chronic pain of right knee Use pain cream and check knee xray - DG Knee Complete 4 Views Right; Future  5. Chronic right shoulder pain Via video she is able to move her arms without difficulty - DG Shoulder Right; Future  6. Encounter for screening for COVID-19  She is to remain in isolation until she has her results   The signs and symptoms of COVID-19 were discussed with the patient and how to seek care for testing (follow up with PCP or arrange E-visit).  The importance of social distancing was discussed today. - Novel Coronavirus, NAA ([facility])    COVID-19 Education: The signs and symptoms of COVID-19 were discussed with the patient and how to seek care for testing (follow up with PCP or arrange E-visit).  The importance  of social distancing was discussed today.  Patient Risk:   After full review of this patients clinical status, I feel that they are at least moderate risk at this time.  Time:   Today, I have spent 16 minutes/ seconds with the patient with telehealth technology discussing above diagnoses.     Medication Adjustments/Labs and Tests Ordered: Current medicines are reviewed at length with the patient today.  Concerns regarding medicines are outlined above.   Tests Ordered: No orders of the defined types were placed in this encounter.   Medication Changes: No orders of the defined types were placed in this encounter.   Disposition:  Follow up prn  Signed, Waylan Rocher, [place]

## 2020-06-13 LAB — NOVEL CORONAVIRUS, NAA: SARS-CoV-2, NAA: NOT DETECTED

## 2020-06-13 LAB — SARS-COV-2, NAA 2 DAY TAT

## 2020-06-20 ENCOUNTER — Telehealth: Payer: Medicare HMO

## 2020-06-20 ENCOUNTER — Telehealth: Payer: Self-pay

## 2020-06-20 ENCOUNTER — Ambulatory Visit (INDEPENDENT_AMBULATORY_CARE_PROVIDER_SITE_OTHER): Payer: Medicare HMO

## 2020-06-20 DIAGNOSIS — I10 Essential (primary) hypertension: Secondary | ICD-10-CM | POA: Diagnosis not present

## 2020-06-20 DIAGNOSIS — F4321 Adjustment disorder with depressed mood: Secondary | ICD-10-CM | POA: Diagnosis not present

## 2020-06-20 DIAGNOSIS — E782 Mixed hyperlipidemia: Secondary | ICD-10-CM

## 2020-06-20 DIAGNOSIS — K219 Gastro-esophageal reflux disease without esophagitis: Secondary | ICD-10-CM

## 2020-06-20 NOTE — Telephone Encounter (Signed)
  Chronic Care Management   Outreach Note  06/20/2020 Name: Wendy Mosley MRN: [id number] DOB: 1950-04-23  Referred by: [name], FNP Reason for referral : Chronic Care Management (RN CM FU Call Attempt)   An unsuccessful telephone outreach was attempted today. The patient was referred to the case management team for assistance with care management and care coordination.   Follow Up Plan: Telephone follow up appointment with care management team member scheduled for: 07/31/20  Barb Merino, RN, BSN, CCM Care Management Coordinator [place] [facility]  Direct Phone: [phone]

## 2020-06-28 ENCOUNTER — Other Ambulatory Visit: Payer: Self-pay

## 2020-06-28 ENCOUNTER — Encounter: Payer: Self-pay | Admitting: Nurse Practitioner

## 2020-06-28 ENCOUNTER — Ambulatory Visit (INDEPENDENT_AMBULATORY_CARE_PROVIDER_SITE_OTHER): Payer: Medicare HMO | Admitting: Nurse Practitioner

## 2020-06-28 VITALS — BP 130/84 | HR 71 | Temp 98.1°F | Ht 64.0 in | Wt 168.4 lb

## 2020-06-28 DIAGNOSIS — L309 Dermatitis, unspecified: Secondary | ICD-10-CM

## 2020-06-28 DIAGNOSIS — E782 Mixed hyperlipidemia: Secondary | ICD-10-CM | POA: Diagnosis not present

## 2020-06-28 DIAGNOSIS — R42 Dizziness and giddiness: Secondary | ICD-10-CM

## 2020-06-28 MED ORDER — CLOBETASOL PROPIONATE 0.05 % EX CREA
TOPICAL_CREAM | CUTANEOUS | 0 refills | Status: DC
Start: 2020-06-28 — End: 2020-12-08

## 2020-06-28 NOTE — Progress Notes (Signed)
I,Katawbba Wiggins,acting as a Education administrator for [employer], NP.,have documented all relevant documentation on the behalf of [employer], NP,as directed by  Bary Castilla, NP while in the presence of Bary Castilla, NP.  This visit occurred during the SARS-CoV-2 public health emergency.  Safety protocols were in place, including screening questions prior to the visit, additional usage of staff PPE, and extensive cleaning of exam room while observing appropriate contact time as indicated for disinfecting solutions.  Subjective:     Patient ID: Wendy Mosley , female    DOB: September 10, 1950 , 70 y.o.   MRN: [id number]   Chief Complaint  Patient presents with  . Hypertension    HPI  Patient presents today for a follow up cholesterol medication. She is doing well on it except some slight bodyaches. She takes the medication every other day.  She is also having some vertigo. She had ear surgery in the 70s-80s. Slightly hearing loss in the right ear. Sometime she has pain in that ear. She does not want to take any med. But would like to see a ear specialist.   Ear surgery in the 70-70s   Hypertension This is a chronic problem. The current episode started more than 1 year ago. The problem is unchanged. The problem is controlled. Pertinent negatives include no anxiety, chest pain, headaches, palpitations or shortness of breath. There are no associated agents to hypertension. Risk factors for coronary artery disease include sedentary lifestyle. Past treatments include nothing. Compliance problems include exercise and diet.  There is no history of chronic renal disease.     Past Medical History:  Diagnosis Date  . CAD in native artery 06/06/2020   CAD native.  Calcium score 176, 85th percentile 04/2020  . Cataracts, bilateral    right eye - just watching  . Diverticulitis   . GERD (gastroesophageal reflux disease) 2006  . Hearing loss    right ear - no hearing aid  . Heart attack ([place])  2008  . Hypertension   . Irregular heart beat 1973  . Palpitations 11/30/2019  . SVD (spontaneous vaginal delivery)    x 3     Family History  Problem Relation Age of Onset  . Asthma Mother   . Stroke Mother   . COPD Mother   . Heart failure Mother   . Diabetes Sister   . Cancer Maternal Grandmother        Lukemia  . Breast cancer Neg Hx      Current Outpatient Medications:  .  aspirin 81 MG tablet, Take 81 mg by mouth every other day. , Disp: , Rfl:  .  atorvastatin (LIPITOR) 20 MG tablet, Take 1 tablet (20 mg total) by mouth daily., Disp: 30 tablet, Rfl: 2 .  B COMPLEX-ZINC PO, Take 1 tablet by mouth daily., Disp: , Rfl:  .  benzonatate (TESSALON PERLES) 100 MG capsule, Take 1 capsule (100 mg total) by mouth every 6 (six) hours as needed., Disp: 30 capsule, Rfl: 1 .  diclofenac Sodium (VOLTAREN) 1 % GEL, Apply 2 g topically 4 (four) times daily., Disp: 100 g, Rfl: 3 .  fluticasone (FLONASE) 50 MCG/ACT nasal spray, Place 2 sprays into both nostrils daily., Disp: 16 g, Rfl: 2 .  ibuprofen (ADVIL,MOTRIN) 200 MG tablet, Take 400-600 mg by mouth every 6 (six) hours as needed for moderate pain., Disp: , Rfl:  .  loratadine (CLARITIN) 10 MG tablet, Take 1 tablet (10 mg total) by mouth daily., Disp: 90 tablet, Rfl: 1 .  clobetasol cream (TEMOVATE) 0.05 %, Apply to affected area twice as needed., Disp: 30 g, Rfl: 0 .  dexlansoprazole (DEXILANT) 60 MG capsule, Take 1 capsule (60 mg total) by mouth daily. (Patient not taking: Reported on 06/28/2020), Disp: 30 capsule, Rfl: 2 .  hydrOXYzine (VISTARIL) 25 MG capsule, Take 1 capsule (25 mg total) by mouth 3 (three) times daily as needed. (Patient not taking: Reported on 06/28/2020), Disp: 30 capsule, Rfl: 0   Allergies  Allergen Reactions  . Sulfonamide Derivatives     Rash/itching      Review of Systems  HENT: Positive for ear pain. Negative for congestion.   Respiratory: Negative for cough, shortness of breath and wheezing.    Cardiovascular: Negative for chest pain and palpitations.  Musculoskeletal: Positive for myalgias.  Skin: Positive for rash.  Neurological: Negative for headaches.     Today's Vitals   06/28/20 0951  BP: 130/84  Pulse: 71  Temp: 98.1 F (36.7 C)  TempSrc: Oral  Weight: 168 lb 6.4 oz (76.4 kg)  Height: 5\' 4"  (1.626 m)  PainSc: 0-No pain   Body mass index is 28.91 kg/m.  Wt Readings from Last 3 Encounters:  06/28/20 168 lb 6.4 oz (76.4 kg)  06/12/20 167 lb (75.8 kg)  06/06/20 167 lb (75.8 kg)   Objective:  Physical Exam Constitutional:      Appearance: Normal appearance.  HENT:     Head: Normocephalic and atraumatic.     Right Ear: Tympanic membrane, ear canal and external ear normal. Decreased hearing noted. No swelling or tenderness. There is no impacted cerumen.     Left Ear: Tympanic membrane, ear canal and external ear normal. No swelling or tenderness. There is no impacted cerumen.  Neurological:     Mental Status: She is alert.         Assessment And Plan:     1. Mixed hyperlipidemia -Stable  -She is taking the atorvastatin every other day. She is having some side-effects of muscle aches but she is tolerating it.   2. Vertigo -Patient had an episode where she felt dizzy and had to hold on to something. BP was stable. Declines to use any medication for dizziness at this time. Would like to see a specialist as she had ear surgery in the 70s-80s so would like a follow up.  - Ambulatory referral to ENT  3. Dermatitis -Refill med  - clobetasol cream (TEMOVATE) 0.05 %; Apply to affected area twice as needed.  Dispense: 30 g; Refill: 0  Follow up: 3 months.   Patient was given opportunity to ask questions. Patient verbalized understanding of the plan and was able to repeat key elements of the plan. All questions were answered to their satisfaction.  Bary Castilla, DNP   I, Bary Castilla, DNP have reviewed all documentation for this visit. The  documentation on 06/28/20 for the exam, diagnosis, procedures, and orders are all accurate and complete.    IF YOU HAVE BEEN REFERRED TO A SPECIALIST, IT MAY TAKE 1-2 WEEKS TO SCHEDULE/PROCESS THE REFERRAL. IF YOU HAVE NOT HEARD FROM US/SPECIALIST IN TWO WEEKS, PLEASE GIVE [place] A CALL AT [phone] X [phone].   THE PATIENT IS ENCOURAGED TO PRACTICE SOCIAL DISTANCING DUE TO THE COVID-19 PANDEMIC.

## 2020-06-28 NOTE — Patient Instructions (Signed)

## 2020-07-02 NOTE — Patient Instructions (Signed)
Goals Addressed      Other   .  Grief - symptoms monitored and managed   On track     Timeframe:  Long-Range Goal Priority:  High Start Date: 05/02/20                            Expected End Date: 07/31/20   Next Follow Up Call: 06/07/20  - Follow up with [facility] if needed - Notify PCP of persistent or worsening signs of depression and or anxiety - Continue to use prayer, medication, distraction and family support to help with coping                    .  Mixed Hyperlipidemia- disease progression minimized or prevented   On track     Timeframe:  Long-Range Goal Priority:  High Start Date: 06/20/20                            Expected End Date: 12/20/20    Next Follow Up date: 07/31/20      Self Care Activities:  . Continue to keep all scheduled follow up appointments . Take medications as directed  . Let your healthcare team know if you are unable to take your medications . Call your pharmacy for refills at least 7 days prior to running out of medication Patient Goals: - lower Cholesterol levels

## 2020-07-02 NOTE — Chronic Care Management (AMB) (Signed)
Chronic Care Management   CCM RN Visit Note  06/20/2020 Name: Wendy Mosley MRN: [id number] DOB: May 13, 1950  Subjective: Wendy Mosley is a 70 y.o. year old female who is a primary care patient of [name], [place]. The care management team was consulted for assistance with disease management and care coordination needs.    Engaged with patient by telephone for follow up visit in response to provider referral for case management and/or care coordination services.   Consent to Services:  The patient was given information about Chronic Care Management services, agreed to services, and gave verbal consent prior to initiation of services.  Please see initial visit note for detailed documentation.   Patient agreed to services and verbal consent obtained.   Assessment: Review of patient past medical history, allergies, medications, health status, including review of consultants reports, laboratory and other test data, was performed as part of comprehensive evaluation and provision of chronic care management services.   SDOH (Social Determinants of Health) assessments and interventions performed:  Yes, no acute needs  CCM Care Plan  Allergies  Allergen Reactions  . Sulfonamide Derivatives     Rash/itching     Outpatient Encounter Medications as of 06/20/2020  Medication Sig  . aspirin 81 MG tablet Take 81 mg by mouth every other day.   [name] atorvastatin (LIPITOR) 20 MG tablet Take 1 tablet (20 mg total) by mouth daily.  . B COMPLEX-ZINC PO Take 1 tablet by mouth daily.  . benzonatate (TESSALON PERLES) 100 MG capsule Take 1 capsule (100 mg total) by mouth every 6 (six) hours as needed.  [name] dexlansoprazole (DEXILANT) 60 MG capsule Take 1 capsule (60 mg total) by mouth daily. (Patient not taking: Reported on 06/28/2020)  . diclofenac Sodium (VOLTAREN) 1 % GEL Apply 2 g topically 4 (four) times daily.  . fluticasone (FLONASE) 50 MCG/ACT nasal spray Place 2 sprays into both nostrils daily.  .  hydrOXYzine (VISTARIL) 25 MG capsule Take 1 capsule (25 mg total) by mouth 3 (three) times daily as needed. (Patient not taking: Reported on 06/28/2020)  . ibuprofen (ADVIL,MOTRIN) 200 MG tablet Take 400-600 mg by mouth every 6 (six) hours as needed for moderate pain.  [name] loratadine (CLARITIN) 10 MG tablet Take 1 tablet (10 mg total) by mouth daily.  . [DISCONTINUED] clobetasol cream (TEMOVATE) 0.05 % APPLY TO AFFECTED AREA TWICE A DAY AS NEEDED   No facility-administered encounter medications on file as of 06/20/2020.    Patient Active Problem List   Diagnosis Date Noted  . CAD in native artery 06/06/2020  . Chronic right shoulder pain 05/15/2020  . Palpitations 11/30/2019  . Constipation 09/15/2018  . Colonic polyp 12/24/2016  . Hyperlipidemia 11/14/2015  . HTN (hypertension) 11/12/2015  . Arthritis of right knee 11/12/2015  . Dermatitis 11/12/2015  . Allergic rhinitis 07/27/2012  . Lichen sclerosus et atrophicus of the vulva 07/12/2011  . GERD (gastroesophageal reflux disease) 07/02/2011  . Postmenopausal atrophic vaginitis 07/02/2011    Conditions to be addressed/monitored:HTN, HLD, GERD  Care Plan : Depression (Adult)  Updates made by Lynne Logan, RN since 07/02/2020 12:00 AM    Problem: Symptoms (Depression)   Priority: High    Long-Range Goal: Grief - Symptoms Monitored and Managed   Start Date: 05/02/2020  Expected End Date: 11/01/2020  Recent Progress: On track  Priority: High  Note:   Current Barriers:   Ineffective Self Health Maintenance Clinical Goal(s):  [name] Collaboration with [name], FNP regarding development and update of  comprehensive plan of care as evidenced by provider attestation and co-signature . Inter-disciplinary care team collaboration (see longitudinal plan of care)  patient will work with care management team to address care coordination and chronic disease management needs related to Disease Management  Educational Needs  Care  Coordination  Medication Management and Education  Psychosocial Support   Interventions:  Evaluation of current treatment plan related to depression self-management and patient's adherence to plan as established by provider.  Collaboration with [name], FNP regarding development and update of comprehensive plan of care as evidenced by provider attestation       and co-signature  Inter-disciplinary care team collaboration (see longitudinal plan of care)  06/20/20 completed successful outbound call to patient   Determined patient feels her depression has improved since last CCM follow up following the loss of her daughter to stage IV lung cancer  Determined she continues to use distraction, prayer and family support to help her cope   Educated patient on the option for grief counseling through [facility] and instructed patient to call if services are needed, provided patient with the contact information for the local office   Discussed plans with patient for ongoing care management follow up and provided patient with direct contact information for care management team Patient [place] Activities:  - Follow up with [facility] if needed - Notify PCP of persistent or worsening signs of depression and or anxiety - Continue to use prayer, medication, distraction and family support to help with coping   Follow Up Plan: Telephone follow up appointment with care management team member scheduled for: 07/31/20   Care Plan : Mixed Hyperlipidemia  Updates made by Lynne Logan, RN since 07/02/2020 12:00 AM    Problem: Mixed Hyperlipidemia   Priority: High    Long-Range Goal: Mixed Hyperlipidemia - disease progression minimized or prevented   Start Date: 06/20/2020  Expected End Date: 12/20/2020  This Visit's Progress: On track  Priority: High  Note:   Current Barriers:   Ineffective Self Health Maintenance  Clinical Goal(s):  [name] Collaboration with  [name], FNP regarding development and update of comprehensive plan of care as evidenced by provider attestation and co-signature . Inter-disciplinary care team collaboration (see longitudinal plan of care)  patient will work with care management team to address care coordination and chronic disease management needs related to Disease Management  Educational Needs  Care Coordination  Medication Management and Education  Psychosocial Support   Interventions:   Evaluation of current treatment plan related to HLD, self-management and patient's adherence to plan as established by provider.  Collaboration with [name], FNP regarding development and update of comprehensive plan of care as evidenced by provider attestation       and co-signature  Inter-disciplinary care team collaboration (see longitudinal plan of care) . 06/20/20 completed successful outbound call to patient  . Provided education to patient about basic disease process Hyperlipidemia  . Review of patient status, including review of consultants reports, relevant laboratory and other test results, and medications completed. . Educated patient on dietary and exercise recommendations  . Reviewed medications with patient and discussed importance of medication adherence . Determined patient has started a routine exercise regimen with her spouse  . Mailed printed educational materials related to Mixed Hyperlipidemia for patient review and discussion at next CM RN follow up  Discussed plans with patient for ongoing care management follow up and provided patient with direct contact information for care management  team Self Care Activities:  . Continue to keep all scheduled follow up appointments . Take medications as directed  . Let your healthcare team know if you are unable to take your medications . Call your pharmacy for refills at least 7 days prior to running out of medication Patient Goals: - lower Cholesterol  levels   Follow Up Plan: Telephone follow up appointment with care management team member scheduled for: 07/31/20     Plan:Telephone follow up appointment with care management team member scheduled for:  07/31/20   Barb Merino, RN, BSN, CCM Care Management Coordinator [facility]  Direct Phone: [phone]

## 2020-07-06 ENCOUNTER — Encounter: Payer: Self-pay | Admitting: Nurse Practitioner

## 2020-07-31 ENCOUNTER — Telehealth: Payer: Medicare HMO

## 2020-07-31 ENCOUNTER — Telehealth: Payer: Self-pay

## 2020-07-31 NOTE — Telephone Encounter (Signed)
  Care Management   Follow Up Note   07/31/2020 Name: Wendy Mosley MRN: [id number] DOB: 07-11-1950   Referred by: [name], FNP Reason for referral : Chronic Care Management (RNCM Follow up call )   An unsuccessful telephone outreach was attempted today. The patient was referred to the case management team for assistance with care management and care coordination.   Follow Up Plan: A HIPPA compliant phone message was left for the patient providing contact information and requesting a return call.   Barb Merino, RN, BSN, CCM Care Management Coordinator [facility]  Direct Phone: [phone]

## 2020-08-08 ENCOUNTER — Other Ambulatory Visit: Payer: Self-pay

## 2020-08-08 ENCOUNTER — Other Ambulatory Visit: Payer: Medicare HMO

## 2020-08-08 ENCOUNTER — Ambulatory Visit
Admission: RE | Admit: 2020-08-08 | Discharge: 2020-08-08 | Disposition: A | Discharge status: Home / Self Care | Payer: Medicare HMO | Source: Ambulatory Visit | Attending: Nurse Practitioner | Admitting: Nurse Practitioner

## 2020-08-08 DIAGNOSIS — Z780 Asymptomatic menopausal state: Secondary | ICD-10-CM | POA: Diagnosis not present

## 2020-08-08 DIAGNOSIS — E2839 Other primary ovarian failure: Secondary | ICD-10-CM

## 2020-08-14 ENCOUNTER — Other Ambulatory Visit: Payer: Self-pay | Admitting: Nurse Practitioner

## 2020-08-14 DIAGNOSIS — E782 Mixed hyperlipidemia: Secondary | ICD-10-CM

## 2020-09-05 DIAGNOSIS — H524 Presbyopia: Secondary | ICD-10-CM | POA: Diagnosis not present

## 2020-09-05 DIAGNOSIS — H0288B Meibomian gland dysfunction left eye, upper and lower eyelids: Secondary | ICD-10-CM | POA: Diagnosis not present

## 2020-09-05 DIAGNOSIS — H5202 Hypermetropia, left eye: Secondary | ICD-10-CM | POA: Diagnosis not present

## 2020-09-05 DIAGNOSIS — H5211 Myopia, right eye: Secondary | ICD-10-CM | POA: Diagnosis not present

## 2020-09-05 DIAGNOSIS — H04123 Dry eye syndrome of bilateral lacrimal glands: Secondary | ICD-10-CM | POA: Diagnosis not present

## 2020-09-05 DIAGNOSIS — H2513 Age-related nuclear cataract, bilateral: Secondary | ICD-10-CM | POA: Diagnosis not present

## 2020-09-05 DIAGNOSIS — H1045 Other chronic allergic conjunctivitis: Secondary | ICD-10-CM | POA: Diagnosis not present

## 2020-09-05 DIAGNOSIS — H0288A Meibomian gland dysfunction right eye, upper and lower eyelids: Secondary | ICD-10-CM | POA: Diagnosis not present

## 2020-09-07 ENCOUNTER — Other Ambulatory Visit: Payer: Self-pay | Admitting: Nurse Practitioner

## 2020-09-07 DIAGNOSIS — R067 Sneezing: Secondary | ICD-10-CM

## 2020-09-07 DIAGNOSIS — R059 Cough, unspecified: Secondary | ICD-10-CM

## 2020-09-11 ENCOUNTER — Telehealth: Payer: Medicare HMO

## 2020-09-11 ENCOUNTER — Ambulatory Visit (INDEPENDENT_AMBULATORY_CARE_PROVIDER_SITE_OTHER): Payer: Medicare HMO

## 2020-09-11 DIAGNOSIS — I10 Essential (primary) hypertension: Secondary | ICD-10-CM | POA: Diagnosis not present

## 2020-09-11 DIAGNOSIS — K219 Gastro-esophageal reflux disease without esophagitis: Secondary | ICD-10-CM

## 2020-09-11 DIAGNOSIS — E782 Mixed hyperlipidemia: Secondary | ICD-10-CM | POA: Diagnosis not present

## 2020-09-24 NOTE — Chronic Care Management (AMB) (Signed)
Chronic Care Management   CCM RN Visit Note  09/11/2020 Name: Wendy Mosley MRN: [id number] DOB: [birth year]  Subjective: Wendy Mosley is a 70 y.o. year old female who is a primary care patient of [name], [place]. The care management team was consulted for assistance with disease management and care coordination needs.    Engaged with patient by telephone for follow up visit in response to provider referral for case management and/or care coordination services.   Consent to Services:  The patient was given information about Chronic Care Management services, agreed to services, and gave verbal consent prior to initiation of services.  Please see initial visit note for detailed documentation.   Patient agreed to services and verbal consent obtained.   Assessment: Review of patient past medical history, allergies, medications, health status, including review of consultants reports, laboratory and other test data, was performed as part of comprehensive evaluation and provision of chronic care management services.   SDOH (Social Determinants of Health) assessments and interventions performed:  Yes, no acute needs   CCM Care Plan  Allergies  Allergen Reactions   Sulfonamide Derivatives     Rash/itching     Outpatient Encounter Medications as of 09/11/2020  Medication Sig   aspirin 81 MG tablet Take 81 mg by mouth every other day.    atorvastatin (LIPITOR) 20 MG tablet TAKE 1 TABLET BY MOUTH EVERY DAY   B COMPLEX-ZINC PO Take 1 tablet by mouth daily.   benzonatate (TESSALON PERLES) 100 MG capsule Take 1 capsule (100 mg total) by mouth every 6 (six) hours as needed.   clobetasol cream (TEMOVATE) 0.05 % Apply to affected area twice as needed.   dexlansoprazole (DEXILANT) 60 MG capsule Take 1 capsule (60 mg total) by mouth daily. (Patient not taking: Reported on 06/28/2020)   diclofenac Sodium (VOLTAREN) 1 % GEL Apply 2 g topically 4 (four) times daily.   fluticasone (FLONASE) 50  MCG/ACT nasal spray SPRAY 2 SPRAYS INTO EACH NOSTRIL EVERY DAY   hydrOXYzine (VISTARIL) 25 MG capsule Take 1 capsule (25 mg total) by mouth 3 (three) times daily as needed. (Patient not taking: Reported on 06/28/2020)   ibuprofen (ADVIL,MOTRIN) 200 MG tablet Take 400-600 mg by mouth every 6 (six) hours as needed for moderate pain.   loratadine (CLARITIN) 10 MG tablet Take 1 tablet (10 mg total) by mouth daily.   No facility-administered encounter medications on file as of 09/11/2020.    Patient Active Problem List   Diagnosis Date Noted   CAD in native artery 06/06/2020   Chronic right shoulder pain 05/15/2020   Palpitations 11/30/2019   Constipation 09/15/2018   Colonic polyp 12/24/2016   Hyperlipidemia 11/14/2015   HTN (hypertension) 11/12/2015   Arthritis of right knee 11/12/2015   Dermatitis 11/12/2015   Allergic rhinitis 0000000   Lichen sclerosus et atrophicus of the vulva 07/12/2011   GERD (gastroesophageal reflux disease) 07/02/2011   Postmenopausal atrophic vaginitis 07/02/2011    Conditions to be addressed/monitored: HTN, HLD, GERD  Care Plan : Depression (Adult)  Updates made by Lynne Logan, RN since 09/11/2020 12:00 AM  Completed 09/24/2020   Problem: Symptoms (Depression) Resolved 09/11/2020  Priority: High     Long-Range Goal: Grief - Symptoms Monitored and Managed Completed 08/12/2020  Start Date: 05/02/2020  Expected End Date: 11/01/2020  Recent Progress: On track  Priority: High  Note:   Current Barriers:  Ineffective Self Health Maintenance Clinical Goal(s):  Collaboration with [name], FNP regarding development and update  of comprehensive plan of care as evidenced by provider attestation and co-signature Inter-disciplinary care team collaboration (see longitudinal plan of care) patient will work with care management team to address care coordination and chronic disease management needs related to Disease Management Educational Needs Care  Coordination Medication Management and Education Psychosocial Support   Interventions: 09/11/20 completed successful outbound call with patient  Evaluation of current treatment plan related to depression self-management and patient's adherence to plan as established by provider. Collaboration with [name], FNP regarding development and update of comprehensive plan of care as evidenced by provider attestation       and co-signature Inter-disciplinary care team collaboration (see longitudinal plan of care) Determined patient feels her depression has improved since last CCM follow up following the loss of her daughter to stage IV lung cancer Determined she continues to use distraction, prayer and family support to help her cope  Re-educated patient on the option for grief counseling through [facility] and instructed patient to call if services are needed, provided patient with the contact information for the local office  Discussed plans with patient for ongoing care management follow up and provided patient with direct contact information for care management team Patient [place] Activities:  - Follow up with [facility] if needed - Notify PCP of persistent or worsening signs of depression and or anxiety - Continue to use prayer, medication, distraction and family support to help with coping   Follow Up Plan: Follow up with provider as needed    Care Plan : Mixed Hyperlipidemia  Updates made by Lynne Logan, RN since 09/11/2020 12:00 AM     Problem: Mixed Hyperlipidemia   Priority: High     Long-Range Goal: Mixed Hyperlipidemia - disease progression minimized or prevented   Start Date: 06/20/2020  Expected End Date: 06/20/2021  Recent Progress: On track  Priority: High  Note:   Current Barriers:  Ineffective Self Health Maintenance  Clinical Goal(s):  Collaboration with [name], FNP regarding development and update of comprehensive plan  of care as evidenced by provider attestation and co-signature Inter-disciplinary care team collaboration (see longitudinal plan of care) patient will work with care management team to address care coordination and chronic disease management needs related to Disease Management Educational Needs Care Coordination Medication Management and Education Psychosocial Support   Interventions:  09/11/20 completed successful outbound call with patient  Evaluation of current treatment plan related to HLD, self-management and patient's adherence to plan as established by provider. Collaboration with [name], FNP regarding development and update of comprehensive plan of care as evidenced by provider attestation       and co-signature Inter-disciplinary care team collaboration (see longitudinal plan of care) Provided education to patient about basic disease process Hyperlipidemia  Review of patient status, including review of consultants reports, relevant laboratory and other test results, and medications completed. Educated patient on dietary and exercise recommendations  Reviewed medications with patient and discussed importance of medication adherence Determined patient is not taking her prescribed anti-lipid therapy due to stating she experienced severe hair loss and changes to her skin after starting the medications, she states she made the PCP aware Determined patient has started a routine exercise regimen with her spouse and is changing her diet by increasing her fiber intake  Mailed printed educational materials related to management of Hyperlipidemia  Discussed plans with patient for ongoing care management follow up and provided patient with direct contact information for care management team Self  Care Activities:  Continue to keep all scheduled follow up appointments Take medications as directed  Let your healthcare team know if you are unable to take your medications Call your pharmacy for  refills at least 7 days prior to running out of medication Patient Goals: - adhere to dietary and exercise recommendations  Follow Up Plan: Telephone follow up appointment with care management team member scheduled for: 11/01/20    Plan:Telephone follow up appointment with care management team member scheduled for:  11/01/20  Barb Merino, RN, BSN, CCM Care Management Coordinator [place] [facility]  Direct Phone: [phone]

## 2020-09-24 NOTE — Patient Instructions (Signed)
Goals Addressed      COMPLETED: Grief - symptoms monitored and managed       Timeframe:  Long-Range Goal Priority:  High Start Date: 05/02/20                            Expected End Date: 07/31/20   Next Follow Up Call: 06/07/20  - Follow up with [facility] if needed - Notify PCP of persistent or worsening signs of depression and or anxiety - Continue to use prayer, medication, distraction and family support to help with coping                     Mixed Hyperlipidemia- disease progression minimized or prevented   On track    Timeframe:  Long-Range Goal Priority:  High Start Date: 06/20/20                            Expected End Date: 06/20/21    Next Follow Up date: 11/01/20     Self Care Activities:  Continue to keep all scheduled follow up appointments Take medications as directed  Let your healthcare team know if you are unable to take your medications Call your pharmacy for refills at least 7 days prior to running out of medication Patient Goals: - Adhere to dietary and exercise recommendations

## 2020-10-30 ENCOUNTER — Encounter: Payer: Medicare HMO | Admitting: Nurse Practitioner

## 2020-10-30 NOTE — Progress Notes (Deleted)
I,Marijke Guadiana T Taygan Connell,acting as a Education administrator for [name], FNP.,have documented all relevant documentation on the behalf of [name], FNP,as directed by  [name], FNP while in the presence of [name], [place].  This visit occurred during the SARS-CoV-2 public health emergency.  Safety protocols were in place, including screening questions prior to the visit, additional usage of staff PPE, and extensive cleaning of exam room while observing appropriate contact time as indicated for disinfecting solutions.  Subjective:     Patient ID: Wendy Mosley , female    DOB: [birth year] , 70 y.o.   MRN: [id number]   Chief Complaint  Patient presents with   Annual Exam     HPI  Pt presents today for HM.     Past Medical History:  Diagnosis Date   CAD in native artery 06/06/2020   CAD native.  Calcium score 176, 85th percentile 04/2020   Cataracts, bilateral    right eye - just watching   Diverticulitis    GERD (gastroesophageal reflux disease) 2006   Hearing loss    right ear - no hearing aid   Heart attack ([place]) 2008   Hypertension    Irregular heart beat 1973   Palpitations 11/30/2019   SVD (spontaneous vaginal delivery)    x 3     Family History  Problem Relation Age of Onset   Asthma Mother    Stroke Mother    COPD Mother    Heart failure Mother    Diabetes Sister    Cancer Maternal Grandmother        Lukemia   Breast cancer Neg Hx      Current Outpatient Medications:    aspirin 81 MG tablet, Take 81 mg by mouth every other day. , Disp: , Rfl:    atorvastatin (LIPITOR) 20 MG tablet, TAKE 1 TABLET BY MOUTH EVERY DAY, Disp: 90 tablet, Rfl: 1   B COMPLEX-ZINC PO, Take 1 tablet by mouth daily., Disp: , Rfl:    benzonatate (TESSALON PERLES) 100 MG capsule, Take 1 capsule (100 mg total) by mouth every 6 (six) hours as needed., Disp: 30 capsule, Rfl: 1   clobetasol cream (TEMOVATE) 0.05 %, Apply to affected area twice as needed., Disp: 30 g, Rfl: 0   dexlansoprazole  (DEXILANT) 60 MG capsule, Take 1 capsule (60 mg total) by mouth daily. (Patient not taking: Reported on 06/28/2020), Disp: 30 capsule, Rfl: 2   diclofenac Sodium (VOLTAREN) 1 % GEL, Apply 2 g topically 4 (four) times daily., Disp: 100 g, Rfl: 3   fluticasone (FLONASE) 50 MCG/ACT nasal spray, SPRAY 2 SPRAYS INTO EACH NOSTRIL EVERY DAY, Disp: 48 mL, Rfl: 1   hydrOXYzine (VISTARIL) 25 MG capsule, Take 1 capsule (25 mg total) by mouth 3 (three) times daily as needed. (Patient not taking: Reported on 06/28/2020), Disp: 30 capsule, Rfl: 0   ibuprofen (ADVIL,MOTRIN) 200 MG tablet, Take 400-600 mg by mouth every 6 (six) hours as needed for moderate pain., Disp: , Rfl:    loratadine (CLARITIN) 10 MG tablet, Take 1 tablet (10 mg total) by mouth daily., Disp: 90 tablet, Rfl: 1   Allergies  Allergen Reactions   Sulfonamide Derivatives     Rash/itching      Review of Systems   There were no vitals filed for this visit. There is no height or weight on file to calculate BMI.   Objective:  Physical Exam      Assessment And Plan:     1. Encounter for  general adult medical examination w/o abnormal findings  2. Essential hypertension    Patient was given opportunity to ask questions. Patient verbalized understanding of the plan and was able to repeat key elements of the plan. All questions were answered to their satisfaction.  Debbora Dus, CMA   I, Debbora Dus, CMA, have reviewed all documentation for this visit. The documentation on 10/30/20 for the exam, diagnosis, procedures, and orders are all accurate and complete.   IF YOU HAVE BEEN REFERRED TO A SPECIALIST, IT MAY TAKE 1-2 WEEKS TO SCHEDULE/PROCESS THE REFERRAL. IF YOU HAVE NOT HEARD FROM US/SPECIALIST IN TWO WEEKS, PLEASE GIVE [place] A CALL AT [phone].   THE PATIENT IS ENCOURAGED TO PRACTICE SOCIAL DISTANCING DUE TO THE COVID-19 PANDEMIC.

## 2020-10-31 DIAGNOSIS — H2512 Age-related nuclear cataract, left eye: Secondary | ICD-10-CM | POA: Diagnosis not present

## 2020-11-01 ENCOUNTER — Telehealth: Payer: Medicare HMO

## 2020-11-01 ENCOUNTER — Ambulatory Visit (INDEPENDENT_AMBULATORY_CARE_PROVIDER_SITE_OTHER): Payer: Medicare HMO

## 2020-11-01 DIAGNOSIS — K219 Gastro-esophageal reflux disease without esophagitis: Secondary | ICD-10-CM

## 2020-11-01 DIAGNOSIS — E782 Mixed hyperlipidemia: Secondary | ICD-10-CM

## 2020-11-01 DIAGNOSIS — I10 Essential (primary) hypertension: Secondary | ICD-10-CM

## 2020-11-05 ENCOUNTER — Telehealth: Payer: Self-pay

## 2020-11-05 ENCOUNTER — Telehealth: Payer: Medicare HMO

## 2020-11-05 NOTE — Telephone Encounter (Signed)
  Care Management   Follow Up Note   11/05/2020 Name: Wendy Mosley MRN: [id number] DOB: 07-27-1950   Referred by: [name], FNP Reason for referral : Chronic Care Management (Unsuccessful call)   An unsuccessful telephone outreach was attempted today. The patient was referred to the case management team for assistance with care management and care coordination. SW left a HIPAA compliant voice message requesting a return call.  Follow Up Plan: The care management team will reach out to the patient again over the next 21 days.   Daneen Schick, BSW, CDP Social Worker, Certified Dementia Practitioner [place] / [facility] [phone]

## 2020-11-07 ENCOUNTER — Ambulatory Visit (INDEPENDENT_AMBULATORY_CARE_PROVIDER_SITE_OTHER): Payer: Medicare HMO

## 2020-11-07 VITALS — Ht 64.0 in | Wt 167.0 lb

## 2020-11-07 DIAGNOSIS — Z0000 Encounter for general adult medical examination without abnormal findings: Secondary | ICD-10-CM

## 2020-11-07 NOTE — Patient Instructions (Signed)
Wendy Mosley , Thank you for taking time to come for your Medicare Wellness Visit. I appreciate your ongoing commitment to your health goals. Please review the following plan we discussed and let me know if I can assist you in the future.   Screening recommendations/referrals: Colonoscopy: completed 09/12/2019 Mammogram: completed 12/21/2019 Bone Density: completed 08/08/2020 Recommended yearly ophthalmology/optometry visit for glaucoma screening and checkup Recommended yearly dental visit for hygiene and checkup  Vaccinations: Influenza vaccine: due Pneumococcal vaccine: completed 10/25/2019 Tdap vaccine: completed 07/12/2011, due 07/11/2021 Shingles vaccine: discussed   Covid-19: 02/15/2020, 06/06/2019, 05/16/2019  Advanced directives: Advance directive discussed with you today.   Conditions/risks identified: none  Next appointment: Follow up in one year for your annual wellness visit    Preventive Care 65 Years and Older, Female Preventive care refers to lifestyle choices and visits with your health care provider that can promote health and wellness. What does preventive care include? A yearly physical exam. This is also called an annual well check. Dental exams once or twice a year. Routine eye exams. Ask your health care provider how often you should have your eyes checked. Personal lifestyle choices, including: Daily care of your teeth and gums. Regular physical activity. Eating a healthy diet. Avoiding tobacco and drug use. Limiting alcohol use. Practicing safe sex. Taking low-dose aspirin every day. Taking vitamin and mineral supplements as recommended by your health care provider. What happens during an annual well check? The services and screenings done by your health care provider during your annual well check will depend on your age, overall health, lifestyle risk factors, and family history of disease. Counseling  Your health care provider may ask you questions about  your: Alcohol use. Tobacco use. Drug use. Emotional well-being. Home and relationship well-being. Sexual activity. Eating habits. History of falls. Memory and ability to understand (cognition). Work and work Statistician. Reproductive health. Screening  You may have the following tests or measurements: Height, weight, and BMI. Blood pressure. Lipid and cholesterol levels. These may be checked every 5 years, or more frequently if you are over 33 years old. Skin check. Lung cancer screening. You may have this screening every year starting at age 48 if you have a 30-pack-year history of smoking and currently smoke or have quit within the past 15 years. Fecal occult blood test (FOBT) of the stool. You may have this test every year starting at age 62. Flexible sigmoidoscopy or colonoscopy. You may have a sigmoidoscopy every 5 years or a colonoscopy every 10 years starting at age 51. Hepatitis C blood test. Hepatitis B blood test. Sexually transmitted disease (STD) testing. Diabetes screening. This is done by checking your blood sugar (glucose) after you have not eaten for a while (fasting). You may have this done every 1-3 years. Bone density scan. This is done to screen for osteoporosis. You may have this done starting at age 44. Mammogram. This may be done every 1-2 years. Talk to your health care provider about how often you should have regular mammograms. Talk with your health care provider about your test results, treatment options, and if necessary, the need for more tests. Vaccines  Your health care provider may recommend certain vaccines, such as: Influenza vaccine. This is recommended every year. Tetanus, diphtheria, and acellular pertussis (Tdap, Td) vaccine. You may need a Td booster every 10 years. Zoster vaccine. You may need this after age 47. Pneumococcal 13-valent conjugate (PCV13) vaccine. One dose is recommended after age 40. Pneumococcal polysaccharide (PPSV23) vaccine.  One dose  is recommended after age 16. Talk to your health care provider about which screenings and vaccines you need and how often you need them. This information is not intended to replace advice given to you by your health care provider. Make sure you discuss any questions you have with your health care provider. Document Released: 03/15/2015 Document Revised: 11/06/2015 Document Reviewed: 12/18/2014 Elsevier Interactive Patient Education  2017 [place] Prevention in the Home Falls can cause injuries. They can happen to people of all ages. There are many things you can do to make your home safe and to help prevent falls. What can I do on the outside of my home? Regularly fix the edges of walkways and driveways and fix any cracks. Remove anything that might make you trip as you walk through a door, such as a raised step or threshold. Trim any bushes or trees on the path to your home. Use bright outdoor lighting. Clear any walking paths of anything that might make someone trip, such as rocks or tools. Regularly check to see if handrails are loose or broken. Make sure that both sides of any steps have handrails. Any raised decks and porches should have guardrails on the edges. Have any leaves, snow, or ice cleared regularly. Use sand or salt on walking paths during winter. Clean up any spills in your garage right away. This includes oil or grease spills. What can I do in the bathroom? Use night lights. Install grab bars by the toilet and in the tub and shower. Do not use towel bars as grab bars. Use non-skid mats or decals in the tub or shower. If you need to sit down in the shower, use a plastic, non-slip stool. Keep the floor dry. Clean up any water that spills on the floor as soon as it happens. Remove soap buildup in the tub or shower regularly. Attach bath mats securely with double-sided non-slip rug tape. Do not have throw rugs and other things on the floor that can make  you trip. What can I do in the bedroom? Use night lights. Make sure that you have a light by your bed that is easy to reach. Do not use any sheets or blankets that are too big for your bed. They should not hang down onto the floor. Have a firm chair that has side arms. You can use this for support while you get dressed. Do not have throw rugs and other things on the floor that can make you trip. What can I do in the kitchen? Clean up any spills right away. Avoid walking on wet floors. Keep items that you use a lot in easy-to-reach places. If you need to reach something above you, use a strong step stool that has a grab bar. Keep electrical cords out of the way. Do not use floor polish or wax that makes floors slippery. If you must use wax, use non-skid floor wax. Do not have throw rugs and other things on the floor that can make you trip. What can I do with my stairs? Do not leave any items on the stairs. Make sure that there are handrails on both sides of the stairs and use them. Fix handrails that are broken or loose. Make sure that handrails are as long as the stairways. Check any carpeting to make sure that it is firmly attached to the stairs. Fix any carpet that is loose or worn. Avoid having throw rugs at the top or bottom of the stairs. If  you do have throw rugs, attach them to the floor with carpet tape. Make sure that you have a light switch at the top of the stairs and the bottom of the stairs. If you do not have them, ask someone to add them for you. What else can I do to help prevent falls? Wear shoes that: Do not have high heels. Have rubber bottoms. Are comfortable and fit you well. Are closed at the toe. Do not wear sandals. If you use a stepladder: Make sure that it is fully opened. Do not climb a closed stepladder. Make sure that both sides of the stepladder are locked into place. Ask someone to hold it for you, if possible. Clearly mark and make sure that you can  see: Any grab bars or handrails. First and last steps. Where the edge of each step is. Use tools that help you move around (mobility aids) if they are needed. These include: Canes. Walkers. Scooters. Crutches. Turn on the lights when you go into a dark area. Replace any light bulbs as soon as they burn out. Set up your furniture so you have a clear path. Avoid moving your furniture around. If any of your floors are uneven, fix them. If there are any pets around you, be aware of where they are. Review your medicines with your doctor. Some medicines can make you feel dizzy. This can increase your chance of falling. Ask your doctor what other things that you can do to help prevent falls. This information is not intended to replace advice given to you by your health care provider. Make sure you discuss any questions you have with your health care provider. Document Released: 12/13/2008 Document Revised: 07/25/2015 Document Reviewed: 03/23/2014 Elsevier Interactive Patient Education  2017 [employer].

## 2020-11-07 NOTE — Progress Notes (Signed)
I connected with Wendy Mosley today by telephone and verified that I am speaking with the correct person using two identifiers. Location patient: home Location provider: work Persons participating in the virtual visit: Wendy Mosley, [name] LPN.   I discussed the limitations, risks, security and privacy concerns of performing an evaluation and management service by telephone and the availability of in person appointments. I also discussed with the patient that there may be a patient responsible charge related to this service. The patient expressed understanding and verbally consented to this telephonic visit.    Interactive audio and video telecommunications were attempted between this provider and patient, however failed, due to patient having technical difficulties OR patient did not have access to video capability.  We continued and completed visit with audio only.     Vital signs may be patient reported or missing.  Subjective:   Wendy Mosley is a 70 y.o. female who presents for Medicare Annual (Subsequent) preventive examination.  Review of Systems     Cardiac Risk Factors include: advanced age (>34mn, >>38women);dyslipidemia;sedentary lifestyle     Objective:    Today's Vitals   11/07/20 1236  Weight: 167 lb (75.8 kg)  Height: '5\' 4"'$  (1.626 m)   Body mass index is 28.67 kg/m.  Advanced Directives 11/07/2020 10/25/2019 09/15/2018 05/27/2018 02/14/2018 12/14/2017 12/24/2016  Does Patient Have a Medical Advance Directive? No No No No No No No  Would patient like information on creating a medical advance directive? - No - Patient declined No - Patient declined Yes (MAU/Ambulatory/Procedural Areas - Information given) No - Patient declined - No - Patient declined    Current Medications (verified) Outpatient Encounter Medications as of 11/07/2020  Medication Sig   B COMPLEX-ZINC PO Take 1 tablet by mouth daily.   benzonatate (TESSALON PERLES) 100 MG capsule Take 1 capsule  (100 mg total) by mouth every 6 (six) hours as needed.   clobetasol cream (TEMOVATE) 0.05 % Apply to affected area twice as needed.   diclofenac Sodium (VOLTAREN) 1 % GEL Apply 2 g topically 4 (four) times daily.   fluticasone (FLONASE) 50 MCG/ACT nasal spray SPRAY 2 SPRAYS INTO EACH NOSTRIL EVERY DAY   ibuprofen (ADVIL,MOTRIN) 200 MG tablet Take 400-600 mg by mouth every 6 (six) hours as needed for moderate pain.   loratadine (CLARITIN) 10 MG tablet Take 1 tablet (10 mg total) by mouth daily.   aspirin 81 MG tablet Take 81 mg by mouth every other day.  (Patient not taking: Reported on 11/07/2020)   atorvastatin (LIPITOR) 20 MG tablet TAKE 1 TABLET BY MOUTH EVERY DAY (Patient not taking: Reported on 11/07/2020)   dexlansoprazole (DEXILANT) 60 MG capsule Take 1 capsule (60 mg total) by mouth daily. (Patient not taking: No sig reported)   hydrOXYzine (VISTARIL) 25 MG capsule Take 1 capsule (25 mg total) by mouth 3 (three) times daily as needed. (Patient not taking: No sig reported)   No facility-administered encounter medications on file as of 11/07/2020.    Allergies (verified) Sulfonamide derivatives   History: Past Medical History:  Diagnosis Date   CAD in native artery 06/06/2020   CAD native.  Calcium score 176, 85th percentile 04/2020   Cataracts, bilateral    right eye - just watching   Diverticulitis    GERD (gastroesophageal reflux disease) 2006   Hearing loss    right ear - no hearing aid   [place]   Hypertension    Irregular heart beat 1973  Palpitations 11/30/2019   SVD (spontaneous vaginal delivery)    x 3   Past Surgical History:  Procedure Laterality Date   APPENDECTOMY  1967   BREAST SURGERY     cyst removed from right breast at age 48   [place] N/A 02/21/2018   Procedure: DILATATION & CURETTAGE/HYSTEROSCOPY WITH MYOSURE RESECTION OF ENDOMETRIAL POLYP;  Surgeon: Servando Salina, MD;  Location: [facility];  Service: Gynecology;  Laterality: N/A;   EXTERNAL EAR SURGERY  1974   TUBAL LIGATION  1977   Family History  Problem Relation Age of Onset   Asthma Mother    Stroke Mother    COPD Mother    Heart failure Mother    Diabetes Sister    Cancer Maternal Grandmother        Lukemia   Breast cancer Neg Hx    Social History   Socioeconomic History   Marital status: Married    Spouse name: Not on file   Number of children: Not on file   Years of education: Not on file   Highest education level: Not on file  Occupational History   Not on file  Tobacco Use   Smoking status: Former    Packs/day: 1.00    Years: 30.00    Pack years: 30.00    Types: Cigarettes    Quit date: 02/01/2011    Years since quitting: 9.7   Smokeless tobacco: Never  Vaping Use   Vaping Use: Never used  Substance and Sexual Activity   Alcohol use: Not Currently    Comment: occasional glass of wine   Drug use: No    Comment: No IV drug   Sexual activity: Not Currently    Birth control/protection: Post-menopausal  Other Topics Concern   Not on file  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[place]   3 [place]   Legally Separated to Second Husband         Social Determinants of Health   Financial Resource Strain: Medium Risk   Difficulty of Paying Living Expenses: Somewhat hard  Food Insecurity: No Food Insecurity   Worried About Charity fundraiser in the Last Year: Never true   Ran Out of Food in the Last Year: Never true  Transportation Needs: No Transportation Needs   Lack of Transportation (Medical): No   Lack of Transportation (Non-Medical): No  Physical Activity: Inactive   Days of Exercise per Week: 0 days   Minutes of Exercise per Session: 0 min  Stress: No Stress Concern Present   Feeling of Stress : Not at all  Social Connections: Not on file    Tobacco Counseling Counseling given: Not Answered   Clinical  Intake:  Pre-visit preparation completed: Yes  Pain : No/denies pain     Nutritional Status: BMI 25 -29 Overweight Nutritional Risks: None Diabetes: No  How often do you need to have someone help you when you read instructions, pamphlets, or other written materials from your doctor or pharmacy?: 1 - Never What is the last grade level you completed in school?: 12th grade  Diabetic? no  Interpreter Needed?: No  Information entered by :: NAllen LPN   Activities of Daily Living In your present state of health, do you have any difficulty performing the following activities: 11/07/2020  Hearing? N  Vision? Y  Comment does not drive at night  Difficulty  concentrating or making decisions? N  Walking or climbing stairs? N  Dressing or bathing? N  Doing errands, shopping? N  Preparing Food and eating ? N  Using the Toilet? N  In the past six months, have you accidently leaked urine? Y  Comment once  Do you have problems with loss of bowel control? N  Managing your Medications? N  Managing your Finances? N  Housekeeping or managing your Housekeeping? N  Some recent data might be hidden    Patient Care Team: [name], FNP as PCP - General ([place]) [place], Claudette Stapler, RN as Case Manager  Indicate any recent Medical Services you may have received from other than [facility] providers in the past year (date may be approximate).     Assessment:   This is a routine wellness examination for Wendy Mosley.  Hearing/Vision screen Vision Screening - Comments:: Regular eye exams, Dr. Katy Fitch  Dietary issues and exercise activities discussed: Current Exercise Habits: The patient does not participate in regular exercise at present   Goals Addressed             This Visit's Progress    Patient Stated       11/07/2020, wants to get toned       Depression Screen PHQ 2/9 Scores 11/07/2020 10/25/2019 03/28/2019 09/15/2018 07/26/2018 05/27/2018 03/29/2017  PHQ - 2 Score 0 0 0 0 0 0 0   PHQ- 9 Score - 0 - 0 - - 0    Fall Risk Fall Risk  11/07/2020 10/25/2019 03/28/2019 09/15/2018 07/26/2018  Falls in the past year? 0 0 0 0 0  Risk for fall due to : Medication side effect No Fall Risks - Medication side effect -  Follow up Falls evaluation completed;Education provided;Falls prevention discussed Falls evaluation completed;Education provided;Falls prevention discussed - Falls evaluation completed;Education provided;Falls prevention discussed -    FALL RISK PREVENTION PERTAINING TO THE HOME:  Any stairs in or around the home? No  If so, are there any without handrails?  N/a Home free of loose throw rugs in walkways, pet beds, electrical cords, etc? Yes  Adequate lighting in your home to reduce risk of falls? Yes   ASSISTIVE DEVICES UTILIZED TO PREVENT FALLS:  Life alert? No  Use of a cane, walker or w/c? No  Grab bars in the bathroom? No  Shower chair or bench in shower? No  Elevated toilet seat or a handicapped toilet? Yes   TIMED UP AND GO:  Was the test performed? No .      Cognitive Function:     6CIT Screen 11/07/2020 10/25/2019 09/15/2018  What Year? 0 points 0 points 0 points  What month? 0 points 0 points 0 points  What time? 0 points 0 points 0 points  Count back from 20 0 points 0 points 2 points  Months in reverse 0 points 0 points 0 points  Repeat phrase 2 points 0 points 0 points  Total Score 2 0 2    Immunizations Immunization History  Administered Date(s) Administered   Influenza,inj,Quad PF,6+ Mos 11/12/2015, 03/29/2017   Influenza-Unspecified 03/29/2017, 02/02/2019   PFIZER(Purple Top)SARS-COV-2 Vaccination 05/16/2019, 06/06/2019, 02/15/2020   Pneumococcal Conjugate-13 03/29/2017, 03/29/2017   Pneumococcal Polysaccharide-23 10/25/2019   Tdap 07/02/2011    TDAP status: Up to date  Flu Vaccine status: Due, Education has been provided regarding the importance of this vaccine. Advised may receive this vaccine at local pharmacy or Health  Dept. Aware to provide a copy of the vaccination record if obtained  from local pharmacy or Health Dept. Verbalized acceptance and understanding.  Pneumococcal vaccine status: Up to date  Covid-19 vaccine status: Completed vaccines  Qualifies for Shingles Vaccine? Yes   Zostavax completed No   Shingrix Completed?: No.    Education has been provided regarding the importance of this vaccine. Patient has been advised to call insurance company to determine out of pocket expense if they have not yet received this vaccine. Advised may also receive vaccine at local pharmacy or Health Dept. Verbalized acceptance and understanding.  Screening Tests Health Maintenance  Topic Date Due   Zoster Vaccines- Shingrix (1 of 2) Never done   COVID-19 Vaccine (4 - Booster for Pfizer series) 06/15/2020   INFLUENZA VACCINE  09/30/2020   TETANUS/TDAP  07/01/2021   MAMMOGRAM  12/20/2021   COLONOSCOPY (Pts 45-73yr Insurance coverage will need to be confirmed)  09/11/2029   DEXA SCAN  Completed   Hepatitis C Screening  Completed   PNA vac Low Risk Adult  Completed   HPV VACCINES  Aged Out    Health Maintenance  Health Maintenance Due  Topic Date Due   Zoster Vaccines- Shingrix (1 of 2) Never done   COVID-19 Vaccine (4 - Booster for Pfizer series) 06/15/2020   INFLUENZA VACCINE  09/30/2020    Colorectal cancer screening: Type of screening: Colonoscopy. Completed 09/12/2019. Repeat every 10 years  Mammogram status: Completed 12/21/2019. Repeat every year  Bone Density status: Completed 08/08/2020.   Lung Cancer Screening: (Low Dose CT Chest recommended if Age 366-80years, 30 pack-year currently smoking OR have quit w/in 15years.) does not qualify.   Lung Cancer Screening Referral: no  Additional Screening:  Hepatitis C Screening: does qualify; Completed 11/12/2015  Vision Screening: Recommended annual ophthalmology exams for early detection of glaucoma and other disorders of the eye. Is the patient  up to date with their annual eye exam?  Yes  Who is the provider or what is the name of the office in which the patient attends annual eye exams? Dr. GKaty FitchIf pt is not established with a provider, would they like to be referred to a provider to establish care? No .   Dental Screening: Recommended annual dental exams for proper oral hygiene  Community Resource Referral / Chronic Care Management: CRR required this visit?  No   CCM required this visit?  No      Plan:     I have personally reviewed and noted the following in the patient's chart:   Medical and social history Use of alcohol, tobacco or illicit drugs  Current medications and supplements including opioid prescriptions.  Functional ability and status Nutritional status Physical activity Advanced directives List of other physicians Hospitalizations, surgeries, and ER visits in previous 12 months Vitals Screenings to include cognitive, depression, and falls Referrals and appointments  In addition, I have reviewed and discussed with patient certain preventive protocols, quality metrics, and best practice recommendations. A written personalized care plan for preventive services as well as general preventive health recommendations were provided to patient.     NKellie Simmering LPN   [id number]  Nurse Notes:

## 2020-11-11 NOTE — Chronic Care Management (AMB) (Signed)
Chronic Care Management   CCM RN Visit Note  11/01/2020 Name: Wendy Mosley MRN: [id number] DOB: 03-17-1950  Subjective: Wendy Mosley is a 70 y.o. year old female who is a primary care patient of [name], [place]. The care management team was consulted for assistance with disease management and care coordination needs.    Engaged with patient by telephone for follow up visit in response to provider referral for case management and/or care coordination services.   Consent to Services:  The patient was given information about Chronic Care Management services, agreed to services, and gave verbal consent prior to initiation of services.  Please see initial visit note for detailed documentation.   Patient agreed to services and verbal consent obtained.   Assessment: Review of patient past medical history, allergies, medications, health status, including review of consultants reports, laboratory and other test data, was performed as part of comprehensive evaluation and provision of chronic care management services.   SDOH (Social Determinants of Health) assessments and interventions performed: Yes, no acute challenges   CCM Care Plan  Allergies  Allergen Reactions   Sulfonamide Derivatives     Rash/itching     Outpatient Encounter Medications as of 11/01/2020  Medication Sig   aspirin 81 MG tablet Take 81 mg by mouth every other day.  (Patient not taking: Reported on 11/07/2020)   atorvastatin (LIPITOR) 20 MG tablet TAKE 1 TABLET BY MOUTH EVERY DAY (Patient not taking: Reported on 11/07/2020)   B COMPLEX-ZINC PO Take 1 tablet by mouth daily.   benzonatate (TESSALON PERLES) 100 MG capsule Take 1 capsule (100 mg total) by mouth every 6 (six) hours as needed.   clobetasol cream (TEMOVATE) 0.05 % Apply to affected area twice as needed.   dexlansoprazole (DEXILANT) 60 MG capsule Take 1 capsule (60 mg total) by mouth daily. (Patient not taking: No sig reported)   diclofenac Sodium (VOLTAREN) 1  % GEL Apply 2 g topically 4 (four) times daily.   fluticasone (FLONASE) 50 MCG/ACT nasal spray SPRAY 2 SPRAYS INTO EACH NOSTRIL EVERY DAY   hydrOXYzine (VISTARIL) 25 MG capsule Take 1 capsule (25 mg total) by mouth 3 (three) times daily as needed. (Patient not taking: No sig reported)   ibuprofen (ADVIL,MOTRIN) 200 MG tablet Take 400-600 mg by mouth every 6 (six) hours as needed for moderate pain.   loratadine (CLARITIN) 10 MG tablet Take 1 tablet (10 mg total) by mouth daily.   No facility-administered encounter medications on file as of 11/01/2020.    Patient Active Problem List   Diagnosis Date Noted   CAD in native artery 06/06/2020   Chronic right shoulder pain 05/15/2020   Palpitations 11/30/2019   Constipation 09/15/2018   Colonic polyp 12/24/2016   Hyperlipidemia 11/14/2015   HTN (hypertension) 11/12/2015   Arthritis of right knee 11/12/2015   Dermatitis 11/12/2015   Allergic rhinitis 0000000   Lichen sclerosus et atrophicus of the vulva 07/12/2011   GERD (gastroesophageal reflux disease) 07/02/2011   Postmenopausal atrophic vaginitis 07/02/2011    Conditions to be addressed/monitored: HTN, HLD, GERD  Care Plan : Mixed Hyperlipidemia  Updates made by Lynne Logan, RN since 11/01/2020 12:00 AM     Problem: Mixed Hyperlipidemia   Priority: High     Long-Range Goal: Mixed Hyperlipidemia - disease progression minimized or prevented   Start Date: 06/20/2020  Expected End Date: 06/20/2021  Recent Progress: On track  Priority: High  Note:   Lipid Panel     Component Value Date/Time  CHOL 254 (H) 05/15/2020 1141   TRIG 127 05/15/2020 1141   HDL 44 05/15/2020 1141   CHOLHDL 5.8 (H) 05/15/2020 1141   CHOLHDL 4.5 11/12/2015 1138   VLDL 17 11/12/2015 1138   LDLCALC 187 (H) 05/15/2020 1141   LDLDIRECT 138 (H) 07/02/2011 1521   LABVLDL 23 05/15/2020 1141  Current Barriers:  Ineffective Self Health Maintenance  Clinical Goal(s):  Collaboration with [name],  FNP regarding development and update of comprehensive plan of care as evidenced by provider attestation and co-signature Inter-disciplinary care team collaboration (see longitudinal plan of care) patient will work with care management team to address care coordination and chronic disease management needs related to Disease Management Educational Needs Care Coordination Medication Management and Education Psychosocial Support   Interventions:  11/01/20 completed successful outbound call with patient  Evaluation of current treatment plan related to HLD, self-management and patient's adherence to plan as established by provider. Collaboration with [name], FNP regarding development and update of comprehensive plan of care as evidenced by provider attestation       and co-signature Inter-disciplinary care team collaboration (see longitudinal plan of care) Provided education to patient about basic disease process Hyperlipidemia  Review of patient status, including review of consultants reports, relevant laboratory and other test results, and medications completed. Educated patient on dietary and exercise recommendations  Reviewed medications with patient and discussed importance of medication adherence Determined patient has opted not to continue her anti lipid therapy due to experiencing SE, PCP is aware Determined patient has started a routine exercise regimen with her spouse and is changing her diet by increasing her fiber intake  Mailed printed educational materials related to Cooking to Lower High Cholesterol Discussed plans with patient for ongoing care management follow up and provided patient with direct contact information for care management team Self Care Activities:  Continue to keep all scheduled follow up appointments Take medications as directed  Let your healthcare team know if you are unable to take your medications Call your pharmacy for refills at least 7 days prior to  running out of medication Patient Goals: - adhere to dietary and exercise recommendations  Follow Up Plan: Telephone follow up appointment with care management team member scheduled for: 01/31/21    Care Plan : Impaired Vision  Updates made by Lynne Logan, RN since 11/01/2020 12:00 AM     Problem: Impaired Vision   Priority: High     Long-Range Goal: Cataract Removal   Start Date: 11/01/2020  Expected End Date: 05/01/2021  This Visit's Progress: On track  Priority: High  Note:   Current Barriers:  Ineffective Self Health Maintenance in a patient with HTN, HLD, GERD Clinical Goal(s):  Collaboration with [name], FNP regarding development and update of comprehensive plan of care as evidenced by provider attestation and co-signature Inter-disciplinary care team collaboration (see longitudinal plan of care) patient will work with care management team to address care coordination and chronic disease management needs related to Disease Management Educational Needs Care Coordination Medication Management and Education Medication Reconciliation Psychosocial Support   Interventions:  11/01/20 completed successful outbound call with patient  Evaluation of current treatment plan related to  Cataract Removal , self-management and patient's adherence to plan as established by provider. Collaboration with [name], FNP regarding development and update of comprehensive plan of care as evidenced by provider attestation       and co-signature Inter-disciplinary care team collaboration (see longitudinal plan of care) Determined patient is experiencing impaired vision  secondary to having Cataracts  Discussed she is scheduled for Cataract removal on October 7th, her husband is available to drive her and assist post operatively Determined patient has a good understanding of what to expect related to her Cataract surgery and recovery Determined patient has a large copayment in order to have  this procedure completed  Sent in basket message to embedded BSW Daneen Schick requesting outreach to patient to offer any available resources  Discussed plans with patient for ongoing care management follow up and provided patient with direct contact information for care management team Self Care Activities:  Self administers medications as prescribed Attends all scheduled provider appointments Calls pharmacy for medication refills Calls provider office for new concerns or questions Patient Goals: - keep scheduled appointments for Cataract Removal   Follow Up Plan: Telephone follow up appointment with care management team member scheduled for: 12/10/20     Plan:Telephone follow up appointment with care management team member scheduled for:  12/10/20  Barb Merino, RN, BSN, CCM Care Management Coordinator [place] [facility]  Direct Phone: [phone]

## 2020-11-11 NOTE — Patient Instructions (Signed)
Goals Addressed      Cataract Removal   On track    Timeframe:  Long-Range Goal Priority:  High Start Date:  11/01/20                           Expected End Date:  05/01/21   Next Scheduled follow up: 12/10/20             Patient Goals: - keep scheduled appointments for Cataract Removal              Mixed Hyperlipidemia- disease progression minimized or prevented   On track    Timeframe:  Long-Range Goal Priority:  High Start Date: 06/20/20                            Expected End Date: 06/20/21    Next Follow Up date: 01/31/21     Self Care Activities:  Continue to keep all scheduled follow up appointments Take medications as directed  Let your healthcare team know if you are unable to take your medications Call your pharmacy for refills at least 7 days prior to running out of medication Patient Goals: - Adhere to dietary and exercise recommendations

## 2020-11-14 ENCOUNTER — Telehealth: Payer: Self-pay

## 2020-11-14 ENCOUNTER — Telehealth: Payer: Medicare HMO

## 2020-11-14 NOTE — Telephone Encounter (Signed)
  Care Management   Follow Up Note   11/14/2020 Name: Wendy Mosley MRN: [id number] DOB: [birth year]   Referred by: [name], FNP Reason for referral : Chronic Care Management (Unsuccessful call)   A second unsuccessful telephone outreach was attempted today. The patient was referred to the case management team for assistance with care management and care coordination. SW left a HIPAA compliant voice message requesting a return call.  Follow Up Plan: The care management team will reach out to the patient again over the next 14 days.   Daneen Schick, BSW, CDP Social Worker, Certified Dementia Practitioner [place] [facility] [place] [facility] [phone]

## 2020-11-18 ENCOUNTER — Ambulatory Visit: Payer: Self-pay

## 2020-11-18 DIAGNOSIS — K219 Gastro-esophageal reflux disease without esophagitis: Secondary | ICD-10-CM

## 2020-11-18 DIAGNOSIS — I10 Essential (primary) hypertension: Secondary | ICD-10-CM

## 2020-11-18 DIAGNOSIS — E782 Mixed hyperlipidemia: Secondary | ICD-10-CM

## 2020-11-19 NOTE — Chronic Care Management (AMB) (Signed)
Chronic Care Management    Social Work Note  11/19/2020 Name: Wendy Mosley MRN: [id number] DOB: 12/31/1950  Wendy Mosley is a 70 y.o. year old female who is a primary care patient of [name], [place]. The CCM team was consulted to assist the patient with chronic disease management and/or care coordination needs related to:  HTN, HLD, and GERD .   Engaged with patient by telephone for follow up visit in response to provider referral for social work chronic care management and care coordination services.   Consent to Services:  The patient was given information about Chronic Care Management services, agreed to services, and gave verbal consent prior to initiation of services.  Please see initial visit note for detailed documentation.   Patient agreed to services and consent obtained.   Assessment: Review of patient past medical history, allergies, medications, and health status, including review of relevant consultants reports was performed today as part of a comprehensive evaluation and provision of chronic care management and care coordination services.     SDOH (Social Determinants of Health) assessments and interventions performed:    Advanced Directives Status: Not addressed in this encounter.  CCM Care Plan  Allergies  Allergen Reactions   Sulfonamide Derivatives     Rash/itching     Outpatient Encounter Medications as of 11/18/2020  Medication Sig   aspirin 81 MG tablet Take 81 mg by mouth every other day.  (Patient not taking: Reported on 11/07/2020)   atorvastatin (LIPITOR) 20 MG tablet TAKE 1 TABLET BY MOUTH EVERY DAY (Patient not taking: Reported on 11/07/2020)   B COMPLEX-ZINC PO Take 1 tablet by mouth daily.   benzonatate (TESSALON PERLES) 100 MG capsule Take 1 capsule (100 mg total) by mouth every 6 (six) hours as needed.   clobetasol cream (TEMOVATE) 0.05 % Apply to affected area twice as needed.   dexlansoprazole (DEXILANT) 60 MG capsule Take 1 capsule (60 mg total)  by mouth daily. (Patient not taking: No sig reported)   diclofenac Sodium (VOLTAREN) 1 % GEL Apply 2 g topically 4 (four) times daily.   fluticasone (FLONASE) 50 MCG/ACT nasal spray SPRAY 2 SPRAYS INTO EACH NOSTRIL EVERY DAY   hydrOXYzine (VISTARIL) 25 MG capsule Take 1 capsule (25 mg total) by mouth 3 (three) times daily as needed. (Patient not taking: No sig reported)   ibuprofen (ADVIL,MOTRIN) 200 MG tablet Take 400-600 mg by mouth every 6 (six) hours as needed for moderate pain.   loratadine (CLARITIN) 10 MG tablet Take 1 tablet (10 mg total) by mouth daily.   No facility-administered encounter medications on file as of 11/18/2020.    Patient Active Problem List   Diagnosis Date Noted   CAD in native artery 06/06/2020   Chronic right shoulder pain 05/15/2020   Palpitations 11/30/2019   Constipation 09/15/2018   Colonic polyp 12/24/2016   Hyperlipidemia 11/14/2015   HTN (hypertension) 11/12/2015   Arthritis of right knee 11/12/2015   Dermatitis 11/12/2015   Allergic rhinitis 89/38/1017   Lichen sclerosus et atrophicus of the vulva 07/12/2011   GERD (gastroesophageal reflux disease) 07/02/2011   Postmenopausal atrophic vaginitis 07/02/2011    Conditions to be addressed/monitored: HTN, HLD, and GERD ; Financial constraints related to costs of medical care  Care Plan : Social Work [facility]  Updates made by Daneen Schick since 11/19/2020 12:00 AM     Problem: Barriers to Treatment      Goal: Barriers to Treatment Identified and Managed   Start Date: 11/18/2020  Expected  End Date: 12/19/2020  Priority: Medium  Note:   Current Barriers:  Chronic disease management support and education needs related to HTN, HLD, and GERD   Financial constraints related to costs of medical care  Social Worker Clinical Goal(s):  patient will work with SW to identify and address any acute and/or chronic care coordination needs related to the self health management of HTN, HLD, and GERD   patient  will work with SW to address concerns related to financial concerns SW Interventions:  Inter-disciplinary care team collaboration (see longitudinal plan of care) Collaboration with [name], FNP regarding development and update of comprehensive plan of care as evidenced by provider attestation and co-signature Collaboration with RN Care Manager who indicates patient is to have upcomming cataract surgery that health plan will not cover as it is classified as "cosmetic" RN Care Manager requests SW intervention with financial resources and possibility of patient enrolling in alternate health plan Researched Medicare Advantage Plans to determine no plan offers a vision rider which can be added at this time Researched Medicare Part C to determine this is equivalent to Medicare Advantage and not something the patient may add to a Medicare Advantage plan Inbound call received from the patient in response to an unsuccessful call placed by SW Discussed the patient has surgery scheduled but is unable to afford payment upfront which is required Advised the patient there are no resources to assist with costs of medical co-pays Discussed there are resources which may help with costs of utilities and other financial responsibilities which the patient may seek in order to save funds Advised the patient most of these resources are low on funds and require disconnect notices prior to offering assistance - patient declined this as a viable resource Determined the patient is interested in applying for Medicaid to assist with medical costs Provided verbal education on the Medicaid program advising the patient is over the income level to qualify - patient stated understanding Educated the patient on the opportunity to apply for Care Credit Encouraged the patient to contact her surgeon to determine if they accept care credit Provided the patient with written information regarding care credit for her to  review Scheduled follow up call over the next month  Patient Goals/Self-Care Activities patient will:   -  Contact her ophthalmologist to determine if care  credit is accepted  -Review provided information on care credit to determine if she would like to apply -Contact SW as needed prior to next scheduled call  Follow Up Plan:  SW will follow up with the patient over the next month       Follow Up Plan: SW will follow up with patient by phone over the next month      Daneen Schick, BSW, CDP Social Worker, Certified Dementia Practitioner [place] [facility] [place] [facility] [phone]

## 2020-11-19 NOTE — Patient Instructions (Signed)
Social Worker Visit Information  Goals we discussed today:   Goals Addressed             This Visit's Progress    Barriers to Treatment Identified and Managed       Timeframe:  Short-Term Goal Priority:  Medium Start Date:  11/18/20                           Expected End Date: 12/19/20                      Next planned outreach: 12/11/20  Patient Goals/Self-Care Activities patient will:   - Contact her ophthalmologist to determine if care  credit is accepted  -Review provided information on care credit to determine if she would like to apply -Contact SW as needed prior to next scheduled call         Materials Provided: Yes: verbal and written information on Care Credit  Patient verbalizes understanding of instructions provided today and agrees to view in [place].   Follow Up Plan: SW will follow up with patient by phone over the next month  Daneen Schick, BSW, CDP Social Worker, Certified Dementia Practitioner [place] [facility] [place] [facility] [phone]

## 2020-11-20 ENCOUNTER — Telehealth: Payer: Medicare HMO

## 2020-11-29 DIAGNOSIS — I10 Essential (primary) hypertension: Secondary | ICD-10-CM

## 2020-11-29 DIAGNOSIS — E782 Mixed hyperlipidemia: Secondary | ICD-10-CM

## 2020-12-06 DIAGNOSIS — H21562 Pupillary abnormality, left eye: Secondary | ICD-10-CM | POA: Diagnosis not present

## 2020-12-06 DIAGNOSIS — H25812 Combined forms of age-related cataract, left eye: Secondary | ICD-10-CM | POA: Diagnosis not present

## 2020-12-06 DIAGNOSIS — H52222 Regular astigmatism, left eye: Secondary | ICD-10-CM | POA: Diagnosis not present

## 2020-12-08 ENCOUNTER — Encounter: Payer: Self-pay | Admitting: Nurse Practitioner

## 2020-12-08 ENCOUNTER — Other Ambulatory Visit: Payer: Self-pay

## 2020-12-08 DIAGNOSIS — L309 Dermatitis, unspecified: Secondary | ICD-10-CM

## 2020-12-10 ENCOUNTER — Telehealth: Payer: Medicare HMO

## 2020-12-10 MED ORDER — CLOBETASOL PROPIONATE 0.05 % EX CREA: TOPICAL_CREAM | CUTANEOUS | 0 refills | Status: DC

## 2020-12-11 ENCOUNTER — Ambulatory Visit (INDEPENDENT_AMBULATORY_CARE_PROVIDER_SITE_OTHER): Payer: Medicare HMO

## 2020-12-11 DIAGNOSIS — I10 Essential (primary) hypertension: Secondary | ICD-10-CM

## 2020-12-11 DIAGNOSIS — K219 Gastro-esophageal reflux disease without esophagitis: Secondary | ICD-10-CM

## 2020-12-11 DIAGNOSIS — E782 Mixed hyperlipidemia: Secondary | ICD-10-CM

## 2020-12-11 NOTE — Chronic Care Management (AMB) (Signed)
Chronic Care Management    Social Work Note  12/11/2020 Name: Wendy Mosley MRN: [id number] DOB: [birth year]  Wendy Mosley is a 70 y.o. year old female who is a primary care patient of [name], [place]. The CCM team was consulted to assist the patient with chronic disease management and/or care coordination needs related to:  HTN, HLD, GERD .   Engaged with patient by telephone for follow up visit in response to provider referral for social work chronic care management and care coordination services.   Consent to Services:  The patient was given information about Chronic Care Management services, agreed to services, and gave verbal consent prior to initiation of services.  Please see initial visit note for detailed documentation.   Patient agreed to services and consent obtained.   Assessment: Review of patient past medical history, allergies, medications, and health status, including review of relevant consultants reports was performed today as part of a comprehensive evaluation and provision of chronic care management and care coordination services.     SDOH (Social Determinants of Health) assessments and interventions performed:    Advanced Directives Status: Not addressed in this encounter.  CCM Care Plan  Allergies  Allergen Reactions   Sulfonamide Derivatives     Rash/itching     Outpatient Encounter Medications as of 12/11/2020  Medication Sig   aspirin 81 MG tablet Take 81 mg by mouth every other day.  (Patient not taking: Reported on 11/07/2020)   atorvastatin (LIPITOR) 20 MG tablet TAKE 1 TABLET BY MOUTH EVERY DAY (Patient not taking: Reported on 11/07/2020)   B COMPLEX-ZINC PO Take 1 tablet by mouth daily.   benzonatate (TESSALON PERLES) 100 MG capsule Take 1 capsule (100 mg total) by mouth every 6 (six) hours as needed.   clobetasol cream (TEMOVATE) 0.05 % Apply to affected area twice as needed.   dexlansoprazole (DEXILANT) 60 MG capsule Take 1 capsule (60 mg total)  by mouth daily. (Patient not taking: No sig reported)   diclofenac Sodium (VOLTAREN) 1 % GEL Apply 2 g topically 4 (four) times daily.   fluticasone (FLONASE) 50 MCG/ACT nasal spray SPRAY 2 SPRAYS INTO EACH NOSTRIL EVERY DAY   hydrOXYzine (VISTARIL) 25 MG capsule Take 1 capsule (25 mg total) by mouth 3 (three) times daily as needed. (Patient not taking: No sig reported)   ibuprofen (ADVIL,MOTRIN) 200 MG tablet Take 400-600 mg by mouth every 6 (six) hours as needed for moderate pain.   loratadine (CLARITIN) 10 MG tablet Take 1 tablet (10 mg total) by mouth daily.   No facility-administered encounter medications on file as of 12/11/2020.    Patient Active Problem List   Diagnosis Date Noted   CAD in native artery 06/06/2020   Chronic right shoulder pain 05/15/2020   Palpitations 11/30/2019   Constipation 09/15/2018   Colonic polyp 12/24/2016   Hyperlipidemia 11/14/2015   HTN (hypertension) 11/12/2015   Arthritis of right knee 11/12/2015   Dermatitis 11/12/2015   Allergic rhinitis 79/15/0569   Lichen sclerosus et atrophicus of the vulva 07/12/2011   GERD (gastroesophageal reflux disease) 07/02/2011   Postmenopausal atrophic vaginitis 07/02/2011    Conditions to be addressed/monitored: HTN, HLD, and GERD  Care Plan : Social Work [facility]  Updates made by Daneen Schick since 12/11/2020 12:00 AM  Completed 12/11/2020   Problem: Barriers to Treatment Resolved 12/11/2020     Goal: Barriers to Treatment Identified and Managed Completed 12/11/2020  Start Date: 11/18/2020  Expected End Date: 12/19/2020  Priority: Medium  Note:   Current Barriers:  Chronic disease management support and education needs related to HTN, HLD, and GERD   Financial constraints related to costs of medical care  Social Worker Clinical Goal(s):  patient will work with SW to identify and address any acute and/or chronic care coordination needs related to the self health management of HTN, HLD, and GERD    patient will work with SW to address concerns related to financial concerns SW Interventions:  Inter-disciplinary care team collaboration (see longitudinal plan of care) Collaboration with [name], FNP regarding development and update of comprehensive plan of care as evidenced by provider attestation and co-signature Successful outbound call placed to the patient to assess goal progression Discussed the patient did apply for care credit to assist with paying for cataract surgery but was declined doe to "making too much money" Determined the patient did have left cataract procedure performed on Friday October 7 and paid for procedure in cash - patient stated she did not want to utilize savings but had to Patient reports she has a follow up appointment tomorrow 10/13 to assess healing progress Advised the patient SW would communicate update to [place] Encouraged the patient to contact the practice as needed Collaboration with [place] to advise of SW plan to sign off as needs have been met  Patient Goals/Self-Care Activities patient will:   -  Contact care management team as needed        Follow Up Plan:  No SW follow up planned at this time. The patient will remain engaged with RN Care Manager.      Daneen Schick, BSW, CDP Social Worker, Certified Dementia Practitioner [place] [facility] [place] [facility] [phone]

## 2020-12-11 NOTE — Patient Instructions (Signed)
Social Worker Visit Information  Goals we discussed today:   Goals Addressed             This Visit's Progress    COMPLETED: Barriers to Treatment Identified and Managed       Timeframe:  Short-Term Goal Priority:  Medium Start Date:  11/18/20                           Expected End Date: 12/19/20                       Patient Goals/Self-Care Activities patient will:   - Contact care management team as needed         Patient verbalizes understanding of instructions provided today and agrees to view in [place].   Follow Up Plan:  No SW follow up planned at this time. Please contact me as needed.  Daneen Schick, BSW, CDP Social Worker, Certified Dementia Practitioner [place] [facility] [place] [facility] [phone]

## 2020-12-19 ENCOUNTER — Telehealth: Payer: Medicare HMO

## 2020-12-27 ENCOUNTER — Telehealth: Payer: Medicare HMO

## 2020-12-27 ENCOUNTER — Ambulatory Visit: Payer: Self-pay

## 2020-12-27 DIAGNOSIS — E782 Mixed hyperlipidemia: Secondary | ICD-10-CM

## 2020-12-27 DIAGNOSIS — K219 Gastro-esophageal reflux disease without esophagitis: Secondary | ICD-10-CM

## 2020-12-27 DIAGNOSIS — I10 Essential (primary) hypertension: Secondary | ICD-10-CM

## 2020-12-27 NOTE — Chronic Care Management (AMB) (Signed)
Chronic Care Management   CCM RN Visit Note  12/27/2020 Name: Wendy Mosley MRN: [id number] DOB: [birth year]  Subjective: Wendy Mosley is a 70 y.o. year old female who is a primary care patient of [name], [place]. The care management team was consulted for assistance with disease management and care coordination needs.    Engaged with patient by telephone for follow up visit in response to provider referral for case management and/or care coordination services.   Consent to Services:  The patient was given information about Chronic Care Management services, agreed to services, and gave verbal consent prior to initiation of services.  Please see initial visit note for detailed documentation.   Patient agreed to services and verbal consent obtained.   Assessment: Review of patient past medical history, allergies, medications, health status, including review of consultants reports, laboratory and other test data, was performed as part of comprehensive evaluation and provision of chronic care management services.   SDOH (Social Determinants of Health) assessments and interventions performed:  SDOH Interventions    Flowsheet Row Most Recent Value  SDOH Interventions   Physical Activity Interventions Other (Comments)  [Edcuated patient re: recommendations for routine exercise (131min weekly)]  Transportation Interventions Intervention Not Indicated        CCM Care Plan  Allergies  Allergen Reactions   Sulfonamide Derivatives     Rash/itching     Outpatient Encounter Medications as of 12/27/2020  Medication Sig   aspirin 81 MG tablet Take 81 mg by mouth every other day.  (Patient not taking: Reported on 11/07/2020)   atorvastatin (LIPITOR) 20 MG tablet TAKE 1 TABLET BY MOUTH EVERY DAY (Patient not taking: Reported on 11/07/2020)   B COMPLEX-ZINC PO Take 1 tablet by mouth daily.   benzonatate (TESSALON PERLES) 100 MG capsule Take 1 capsule (100 mg total) by mouth every 6 (six)  hours as needed.   clobetasol cream (TEMOVATE) 0.05 % Apply to affected area twice as needed.   dexlansoprazole (DEXILANT) 60 MG capsule Take 1 capsule (60 mg total) by mouth daily. (Patient not taking: No sig reported)   diclofenac Sodium (VOLTAREN) 1 % GEL Apply 2 g topically 4 (four) times daily.   fluticasone (FLONASE) 50 MCG/ACT nasal spray SPRAY 2 SPRAYS INTO EACH NOSTRIL EVERY DAY   hydrOXYzine (VISTARIL) 25 MG capsule Take 1 capsule (25 mg total) by mouth 3 (three) times daily as needed. (Patient not taking: No sig reported)   ibuprofen (ADVIL,MOTRIN) 200 MG tablet Take 400-600 mg by mouth every 6 (six) hours as needed for moderate pain.   loratadine (CLARITIN) 10 MG tablet Take 1 tablet (10 mg total) by mouth daily.   No facility-administered encounter medications on file as of 12/27/2020.    Patient Active Problem List   Diagnosis Date Noted   CAD in native artery 06/06/2020   Chronic right shoulder pain 05/15/2020   Palpitations 11/30/2019   Constipation 09/15/2018   Colonic polyp 12/24/2016   Hyperlipidemia 11/14/2015   HTN (hypertension) 11/12/2015   Arthritis of right knee 11/12/2015   Dermatitis 11/12/2015   Allergic rhinitis 33/54/5625   Lichen sclerosus et atrophicus of the vulva 07/12/2011   GERD (gastroesophageal reflux disease) 07/02/2011   Postmenopausal atrophic vaginitis 07/02/2011    Conditions to be addressed/monitored: HTN, HLD, GERD  Care Plan : RN Care Manager Plan of Care  Updates made by Lynne Logan, RN since 12/27/2020 12:00 AM     Problem: No plan of care established for management of chronic  disease states (HTN, HLD, GERD)   Priority: High     Long-Range Goal: Development of plan of care for chronic disease management for HTN, HLD, GERD   Start Date: 12/27/2020  Expected End Date: 12/27/2021  This Visit's Progress: On track  Priority: High  Note:   Current Barriers:  Knowledge Deficits related to plan of care for management of HTN,  HLD, GERD Chronic Disease Management support and education needs related to HTN, HLD, GERD  RNCM Clinical Goal(s):  Patient will verbalize basic understanding of  HTN, HLD, GERD disease process and self health management plan   take all medications exactly as prescribed and will call provider for medication related questions attend all scheduled medical appointments: reschedule PCP physical demonstrate Improved health management independence   continue to work with RN Care Manager to address care management and care coordination needs related to  HTN, HLD, GERD will demonstrate ongoing self health care management ability    through collaboration with RN Care manager, provider, and care team.   Interventions: 1:1 collaboration with primary care provider regarding development and update of comprehensive plan of care as evidenced by provider attestation and co-signature Inter-disciplinary care team collaboration (see longitudinal plan of care) Evaluation of current treatment plan related to  self management and patient's adherence to plan as established by provider  Hyperlipidemia Interventions: Medication review performed; medication list updated in electronic medical record.  Provider established cholesterol goals reviewed; Counseled on importance of regular laboratory monitoring as prescribed; Reviewed importance of limiting foods high in cholesterol; Reviewed exercise goals and target of 150 minutes per week;  Patient Goals/Self-Care Activities: Patient will self administer medications as prescribed Patient will attend all scheduled provider appointments Patient will call pharmacy for medication refills Patient will attend church or other social activities Patient will continue to perform ADL's independently Patient will continue to perform IADL's independently Patient will call provider office for new concerns or questions  Follow Up Plan:  Telephone follow up appointment with care  management team member scheduled for:  01/31/21     Plan:Telephone follow up appointment with care management team member scheduled for:  01/31/21  Barb Merino, RN, BSN, CCM Care Management Coordinator [facility] [facility]  Direct Phone: [phone]

## 2020-12-27 NOTE — Patient Instructions (Signed)
Visit Information   Consent to CCM Services: Ms. Delmont was given information about Chronic Care Management services including:  CCM service includes personalized support from designated clinical staff supervised by her physician, including individualized plan of care and coordination with other care providers 24/7 contact phone numbers for assistance for urgent and routine care needs. Service will only be billed when office clinical staff spend 20 minutes or more in a month to coordinate care. Only one practitioner may furnish and bill the service in a calendar month. The patient may stop CCM services at any time (effective at the end of the month) by phone call to the office staff. The patient will be responsible for cost sharing (co-pay) of up to 20% of the service fee (after annual deductible is met).  Patient agreed to services and verbal consent obtained.   The patient verbalized understanding of instructions, educational materials, and care plan provided today and declined offer to receive copy of patient instructions, educational materials, and care plan.   Telephone follow up appointment with care management team member scheduled for: 01/31/21  Barb Merino, RN, BSN, CCM Care Management Coordinator [facility]  Direct Phone: [phone]    CLINICAL CARE PLAN:  Patient Care Plan: RN Care Manager Plan of Care     Problem Identified: No plan of care established for management of chronic disease states (HTN, HLD, GERD)   Priority: High     Long-Range Goal: Development of plan of care for chronic disease management for HTN, HLD, GERD   Start Date: 12/27/2020  Expected End Date: 12/27/2021  This Visit's Progress: On track  Priority: High  Note:   Current Barriers:  Knowledge Deficits related to plan of care for management of HTN, HLD, GERD Chronic Disease Management support and education needs related to HTN, HLD, GERD  RNCM  Clinical Goal(s):  Patient will verbalize basic understanding of  HTN, HLD, GERD disease process and self health management plan   take all medications exactly as prescribed and will call provider for medication related questions attend all scheduled medical appointments: reschedule PCP physical demonstrate Improved health management independence   continue to work with RN Care Manager to address care management and care coordination needs related to  HTN, HLD, GERD will demonstrate ongoing self health care management ability    through collaboration with RN Care manager, provider, and care team.   Interventions: 1:1 collaboration with primary care provider regarding development and update of comprehensive plan of care as evidenced by provider attestation and co-signature Inter-disciplinary care team collaboration (see longitudinal plan of care) Evaluation of current treatment plan related to  self management and patient's adherence to plan as established by provider  Hyperlipidemia Interventions: Medication review performed; medication list updated in electronic medical record.  Provider established cholesterol goals reviewed; Counseled on importance of regular laboratory monitoring as prescribed; Reviewed importance of limiting foods high in cholesterol; Reviewed exercise goals and target of 150 minutes per week;  Patient Goals/Self-Care Activities: Patient will self administer medications as prescribed Patient will attend all scheduled provider appointments Patient will call pharmacy for medication refills Patient will attend church or other social activities Patient will continue to perform ADL's independently Patient will continue to perform IADL's independently Patient will call provider office for new concerns or questions  Follow Up Plan:  Telephone follow up appointment with care management team member scheduled for:  01/31/21

## 2020-12-30 DIAGNOSIS — I10 Essential (primary) hypertension: Secondary | ICD-10-CM | POA: Diagnosis not present

## 2020-12-30 DIAGNOSIS — E782 Mixed hyperlipidemia: Secondary | ICD-10-CM

## 2021-01-31 ENCOUNTER — Telehealth: Payer: Medicare HMO

## 2021-01-31 ENCOUNTER — Ambulatory Visit (INDEPENDENT_AMBULATORY_CARE_PROVIDER_SITE_OTHER): Payer: Medicare HMO

## 2021-01-31 DIAGNOSIS — K219 Gastro-esophageal reflux disease without esophagitis: Secondary | ICD-10-CM

## 2021-01-31 DIAGNOSIS — E782 Mixed hyperlipidemia: Secondary | ICD-10-CM

## 2021-01-31 DIAGNOSIS — I10 Essential (primary) hypertension: Secondary | ICD-10-CM

## 2021-01-31 NOTE — Patient Instructions (Addendum)
Visit Information   Thank you for taking time to visit with me today. Please don't hesitate to contact me if I can be of assistance to you before our next scheduled telephone appointment.  Following are the goals we discussed today:  (Copy and paste patient goals from clinical care plan here)  Our next appointment is by telephone on 03/14/21 at 10:20 AM  Please call the care guide team at [phone] if you need to cancel or reschedule your appointment.   If you are experiencing a Mental Health or [place] or need someone to talk to, please call 1-800-273-TALK (toll free, 24 hour hotline)   Following is a copy of your full care plan:  Care Plan : [place] of Care  Updates made by Lynne Logan, RN since 01/31/2021 12:00 AM     Problem: No plan of care established for management of chronic disease states (HTN, HLD, GERD)   Priority: High     Long-Range Goal: Development of plan of care for chronic disease management for HTN, HLD, GERD   Start Date: 12/27/2020  Expected End Date: 12/27/2021  Recent Progress: On track  Priority: High  Note:   Current Barriers:  Knowledge Deficits related to plan of care for management of HTN, HLD, GERD Chronic Disease Management support and education needs related to HTN, HLD, GERD  RNCM Clinical Goal(s):  Patient will verbalize basic understanding of  HTN, HLD, GERD disease process and self health management plan   take all medications exactly as prescribed and will call provider for medication related questions attend all scheduled medical appointments: reschedule PCP physical demonstrate Improved health management independence   continue to work with RN Care Manager to address care management and care coordination needs related to  HTN, HLD, GERD will demonstrate ongoing self health care management ability    through collaboration with RN Care manager, provider, and care team.   Interventions: 1:1 collaboration  with primary care provider regarding development and update of comprehensive plan of care as evidenced by provider attestation and co-signature Inter-disciplinary care team collaboration (see longitudinal plan of care) Evaluation of current treatment plan related to  self management and patient's adherence to plan as established by provider  Hyperlipidemia Interventions: (Status: Condition Stable. Not addressed at this time) Medication review performed; medication list updated in electronic medical record.  Provider established cholesterol goals reviewed; Counseled on importance of regular laboratory monitoring as prescribed; Reviewed importance of limiting foods high in cholesterol; Reviewed exercise goals and target of 150 minutes per week; Discussed plans with patient for ongoing care management follow up and provided patient with direct contact information for care management team   Cataract Removal:  (Status:  New goal.)  Short Term Goal Evaluation of current treatment plan related to  Cataract Removal , self-management and patient's adherence to plan as established by provider Determined patient is scheduled to have her second Cataract Removal on 02/25/21 Assessed for question and or concerns related to this procedure, patient denies  Determined patient's husband will be available to drive her to this appointment and will assist as needed following the surgery Discussed plans with patient for ongoing care management follow up and provided patient with direct contact information for care management team  GERD Interventions:  (Status:  New goal.)  Long Term Goal Evaluation of current treatment plan related to GERD, self-management and patient's adherence to plan as established by provider. Provided education to patient re: basic disease process related to GERD,  educated on risk factors, causes and potential complications if left untreated; Educated on treatment options per PCP; Educated on  foods to avoid eating that may potentate GERD and recommended avoiding prolonged sitting or lying down immediately after eating a meal  Reviewed medications with patient and discussed patient was given samples of a medication to treat GERD by PCP at last visit, she cannot recall the name of the medication and used up the samples; Discussed patient was prescribed Dexilant by PCP in the past Encouraged patient to ask her pharmacist before taking OTC GERD medication  Assessed social determinant of health barriers  Encouraged patient to discuss this condition with PCP at next visit Discussed patient is overdue for her physical and will need to contact the office to schedule this appointment, she verbalizes understanding   Mailed printed educational material related to GERD  Discussed plans with patient for ongoing care management follow up and provided patient with direct contact information for care management team  Patient Goals/Self-Care Activities: Patient will self administer medications as prescribed Patient will attend all scheduled provider appointments Patient will call pharmacy for medication refills Patient will attend church or other social activities Patient will continue to perform ADL's independently Patient will continue to perform IADL's independently Patient will call provider office for new concerns or questions  Follow Up Plan:  Telephone follow up appointment with care management team member scheduled for:  03/14/21      Consent to CCM Services: Ms. Ivanoff was given information about Chronic Care Management services including:  CCM service includes personalized support from designated clinical staff supervised by her physician, including individualized plan of care and coordination with other care providers 24/7 contact phone numbers for assistance for urgent and routine care needs. Service will only be billed when office clinical staff spend 20 minutes or more in a month to  coordinate care. Only one practitioner may furnish and bill the service in a calendar month. The patient may stop CCM services at any time (effective at the end of the month) by phone call to the office staff. The patient will be responsible for cost sharing (co-pay) of up to 20% of the service fee (after annual deductible is met).  Patient agreed to services and verbal consent obtained.   Patient verbalizes understanding of instructions provided today and agrees to view in [place].   Telephone follow up appointment with care management team member scheduled for: 03/14/21

## 2021-01-31 NOTE — Chronic Care Management (AMB) (Signed)
Chronic Care Management   CCM RN Visit Note  01/31/2021 Name: Wendy Mosley MRN: [id number] DOB: 07-02-1950  Subjective: Wendy Mosley is a 70 y.o. year old female who is a primary care patient of [name], [place]. The care management team was consulted for assistance with disease management and care coordination needs.    Engaged with patient by telephone for follow up visit in response to provider referral for case management and/or care coordination services.   Consent to Services:  The patient was given information about Chronic Care Management services, agreed to services, and gave verbal consent prior to initiation of services.  Please see initial visit note for detailed documentation.   Patient agreed to services and verbal consent obtained.   Assessment: Review of patient past medical history, allergies, medications, health status, including review of consultants reports, laboratory and other test data, was performed as part of comprehensive evaluation and provision of chronic care management services.   SDOH (Social Determinants of Health) assessments and interventions performed:    CCM Care Plan  Allergies  Allergen Reactions   Sulfonamide Derivatives     Rash/itching     Outpatient Encounter Medications as of 01/31/2021  Medication Sig   aspirin 81 MG tablet Take 81 mg by mouth every other day.  (Patient not taking: Reported on 11/07/2020)   atorvastatin (LIPITOR) 20 MG tablet TAKE 1 TABLET BY MOUTH EVERY DAY (Patient not taking: Reported on 11/07/2020)   B COMPLEX-ZINC PO Take 1 tablet by mouth daily.   benzonatate (TESSALON PERLES) 100 MG capsule Take 1 capsule (100 mg total) by mouth every 6 (six) hours as needed.   clobetasol cream (TEMOVATE) 0.05 % Apply to affected area twice as needed.   dexlansoprazole (DEXILANT) 60 MG capsule Take 1 capsule (60 mg total) by mouth daily. (Patient not taking: No sig reported)   diclofenac Sodium (VOLTAREN) 1 % GEL Apply 2 g  topically 4 (four) times daily.   fluticasone (FLONASE) 50 MCG/ACT nasal spray SPRAY 2 SPRAYS INTO EACH NOSTRIL EVERY DAY   hydrOXYzine (VISTARIL) 25 MG capsule Take 1 capsule (25 mg total) by mouth 3 (three) times daily as needed. (Patient not taking: No sig reported)   ibuprofen (ADVIL,MOTRIN) 200 MG tablet Take 400-600 mg by mouth every 6 (six) hours as needed for moderate pain.   loratadine (CLARITIN) 10 MG tablet Take 1 tablet (10 mg total) by mouth daily.   No facility-administered encounter medications on file as of 01/31/2021.    Patient Active Problem List   Diagnosis Date Noted   CAD in native artery 06/06/2020   Chronic right shoulder pain 05/15/2020   Palpitations 11/30/2019   Constipation 09/15/2018   Colonic polyp 12/24/2016   Hyperlipidemia 11/14/2015   HTN (hypertension) 11/12/2015   Arthritis of right knee 11/12/2015   Dermatitis 11/12/2015   Allergic rhinitis 17/49/4496   Lichen sclerosus et atrophicus of the vulva 07/12/2011   GERD (gastroesophageal reflux disease) 07/02/2011   Postmenopausal atrophic vaginitis 07/02/2011    Conditions to be addressed/monitored: HTN, HLD, GERD  Care Plan : RN Care Manager Plan of Care  Updates made by Lynne Logan, RN since 01/31/2021 12:00 AM     Problem: No plan of care established for management of chronic disease states (HTN, HLD, GERD)   Priority: High     Long-Range Goal: Development of plan of care for chronic disease management for HTN, HLD, GERD   Start Date: 12/27/2020  Expected End Date: 12/27/2021  Recent Progress:  On track  Priority: High  Note:   Current Barriers:  Knowledge Deficits related to plan of care for management of HTN, HLD, GERD Chronic Disease Management support and education needs related to HTN, HLD, GERD  RNCM Clinical Goal(s):  Patient will verbalize basic understanding of  HTN, HLD, GERD disease process and self health management plan   take all medications exactly as prescribed and  will call provider for medication related questions attend all scheduled medical appointments: reschedule PCP physical demonstrate Improved health management independence   continue to work with RN Care Manager to address care management and care coordination needs related to  HTN, HLD, GERD will demonstrate ongoing self health care management ability    through collaboration with RN Care manager, provider, and care team.   Interventions: 1:1 collaboration with primary care provider regarding development and update of comprehensive plan of care as evidenced by provider attestation and co-signature Inter-disciplinary care team collaboration (see longitudinal plan of care) Evaluation of current treatment plan related to  self management and patient's adherence to plan as established by provider  Hyperlipidemia Interventions: (Status: Condition Stable. Not addressed at this time) Medication review performed; medication list updated in electronic medical record.  Provider established cholesterol goals reviewed; Counseled on importance of regular laboratory monitoring as prescribed; Reviewed importance of limiting foods high in cholesterol; Reviewed exercise goals and target of 150 minutes per week; Discussed plans with patient for ongoing care management follow up and provided patient with direct contact information for care management team   Cataract Removal:  (Status:  New goal.)  Short Term Goal Evaluation of current treatment plan related to  Cataract Removal , self-management and patient's adherence to plan as established by provider Determined patient is scheduled to have her second Cataract Removal on 02/25/21 Assessed for question and or concerns related to this procedure, patient denies  Determined patient's husband will be available to drive her to this appointment and will assist as needed following the surgery Discussed plans with patient for ongoing care management follow up and  provided patient with direct contact information for care management team  GERD Interventions:  (Status:  New goal.)  Long Term Goal Evaluation of current treatment plan related to GERD, self-management and patient's adherence to plan as established by provider. Provided education to patient re: basic disease process related to GERD, educated on risk factors, causes and potential complications if left untreated; Educated on treatment options per PCP; Educated on foods to avoid eating that may potentate GERD and recommended avoiding prolonged sitting or lying down immediately after eating a meal  Reviewed medications with patient and discussed patient was given samples of a medication to treat GERD by PCP at last visit, she cannot recall the name of the medication and used up the samples; Discussed patient was prescribed Dexilant by PCP in the past Assessed social determinant of health barriers  Encouraged patient to ask her pharmacist before taking OTC GERD medication  Encouraged patient to discuss this condition with PCP at next visit Discussed patient is overdue for her physical and will need to contact the office to schedule this appointment, she verbalizes understanding  Mailed printed educational material related to GERD  Discussed plans with patient for ongoing care management follow up and provided patient with direct contact information for care management team  Patient Goals/Self-Care Activities: Patient will self administer medications as prescribed Patient will attend all scheduled provider appointments Patient will call pharmacy for medication refills Patient will attend church or other  social activities Patient will continue to perform ADL's independently Patient will continue to perform IADL's independently Patient will call provider office for new concerns or questions  Follow Up Plan:  Telephone follow up appointment with care management team member scheduled for:  03/14/21       Plan:Telephone follow up appointment with care management team member scheduled for:  03/14/21  Barb Merino, RN, BSN, CCM Care Management Coordinator [place] [facility]  Direct Phone: [phone]

## 2021-02-19 DIAGNOSIS — H2511 Age-related nuclear cataract, right eye: Secondary | ICD-10-CM | POA: Diagnosis not present

## 2021-02-25 DIAGNOSIS — H52221 Regular astigmatism, right eye: Secondary | ICD-10-CM | POA: Diagnosis not present

## 2021-02-25 DIAGNOSIS — H21561 Pupillary abnormality, right eye: Secondary | ICD-10-CM | POA: Diagnosis not present

## 2021-02-25 DIAGNOSIS — H25811 Combined forms of age-related cataract, right eye: Secondary | ICD-10-CM | POA: Diagnosis not present

## 2021-03-01 DIAGNOSIS — I10 Essential (primary) hypertension: Secondary | ICD-10-CM

## 2021-03-01 DIAGNOSIS — E782 Mixed hyperlipidemia: Secondary | ICD-10-CM

## 2021-03-14 ENCOUNTER — Telehealth: Payer: Medicare HMO

## 2021-04-04 ENCOUNTER — Other Ambulatory Visit: Payer: Self-pay | Admitting: Nurse Practitioner

## 2021-04-04 DIAGNOSIS — R059 Cough, unspecified: Secondary | ICD-10-CM

## 2021-04-04 DIAGNOSIS — R067 Sneezing: Secondary | ICD-10-CM

## 2021-04-18 ENCOUNTER — Ambulatory Visit (INDEPENDENT_AMBULATORY_CARE_PROVIDER_SITE_OTHER): Payer: Medicare HMO

## 2021-04-18 ENCOUNTER — Telehealth: Payer: Medicare HMO

## 2021-04-18 DIAGNOSIS — E782 Mixed hyperlipidemia: Secondary | ICD-10-CM

## 2021-04-18 DIAGNOSIS — I10 Essential (primary) hypertension: Secondary | ICD-10-CM

## 2021-04-18 DIAGNOSIS — K219 Gastro-esophageal reflux disease without esophagitis: Secondary | ICD-10-CM

## 2021-04-18 NOTE — Patient Instructions (Signed)
Visit Information  Thank you for taking time to visit with me today. Please don't hesitate to contact me if I can be of assistance to you before our next scheduled telephone appointment.  Following are the goals we discussed today:  (Copy and paste patient goals from clinical care plan here)  Our next appointment is by telephone on 06/16/21 at 12 PM  Please call the care guide team at [phone] if you need to cancel or reschedule your appointment.   If you are experiencing a Mental Health or [place] or need someone to talk to, please call 1-800-273-TALK (toll free, 24 hour hotline)   Patient verbalizes understanding of instructions and care plan provided today and agrees to view in [place]. Active MyChart status confirmed with patient.    Barb Merino, RN, BSN, CCM Care Management Coordinator [place] [facility]  Direct Phone: [phone]

## 2021-04-18 NOTE — Chronic Care Management (AMB) (Signed)
Chronic Care Management   CCM RN Visit Note  04/18/2021 Name: Wendy Mosley MRN: [id number] DOB: 10-24-1950  Subjective: Wendy Mosley is a 71 y.o. year old female who is a primary care patient of [name], [place]. The care management team was consulted for assistance with disease management and care coordination needs.    Engaged with patient by telephone for follow up visit in response to provider referral for case management and/or care coordination services.   Consent to Services:  The patient was given information about Chronic Care Management services, agreed to services, and gave verbal consent prior to initiation of services.  Please see initial visit note for detailed documentation.   Patient agreed to services and verbal consent obtained.   Assessment: Review of patient past medical history, allergies, medications, health status, including review of consultants reports, laboratory and other test data, was performed as part of comprehensive evaluation and provision of chronic care management services.   SDOH (Social Determinants of Health) assessments and interventions performed:    CCM Care Plan  Allergies  Allergen Reactions   Sulfonamide Derivatives     Rash/itching     Outpatient Encounter Medications as of 04/18/2021  Medication Sig   aspirin 81 MG tablet Take 81 mg by mouth every other day.  (Patient not taking: Reported on 11/07/2020)   atorvastatin (LIPITOR) 20 MG tablet TAKE 1 TABLET BY MOUTH EVERY DAY (Patient not taking: Reported on 11/07/2020)   B COMPLEX-ZINC PO Take 1 tablet by mouth daily.   benzonatate (TESSALON PERLES) 100 MG capsule Take 1 capsule (100 mg total) by mouth every 6 (six) hours as needed.   clobetasol cream (TEMOVATE) 0.05 % Apply to affected area twice as needed.   dexlansoprazole (DEXILANT) 60 MG capsule Take 1 capsule (60 mg total) by mouth daily. (Patient not taking: No sig reported)   diclofenac Sodium (VOLTAREN) 1 % GEL Apply 2 g  topically 4 (four) times daily.   fluticasone (FLONASE) 50 MCG/ACT nasal spray SPRAY 2 SPRAYS INTO EACH NOSTRIL EVERY DAY   hydrOXYzine (VISTARIL) 25 MG capsule Take 1 capsule (25 mg total) by mouth 3 (three) times daily as needed. (Patient not taking: No sig reported)   ibuprofen (ADVIL,MOTRIN) 200 MG tablet Take 400-600 mg by mouth every 6 (six) hours as needed for moderate pain.   loratadine (CLARITIN) 10 MG tablet TAKE 1 TABLET BY MOUTH EVERY DAY   No facility-administered encounter medications on file as of 04/18/2021.    Patient Active Problem List   Diagnosis Date Noted   CAD in native artery 06/06/2020   Chronic right shoulder pain 05/15/2020   Palpitations 11/30/2019   Constipation 09/15/2018   Colonic polyp 12/24/2016   Hyperlipidemia 11/14/2015   HTN (hypertension) 11/12/2015   Arthritis of right knee 11/12/2015   Dermatitis 11/12/2015   Allergic rhinitis 31/51/7616   Lichen sclerosus et atrophicus of the vulva 07/12/2011   GERD (gastroesophageal reflux disease) 07/02/2011   Postmenopausal atrophic vaginitis 07/02/2011    Conditions to be addressed/monitored: HTN, HLD, GERD  Care Plan : RN Care Manager Plan of Care  Updates made by Lynne Logan, RN since 04/18/2021 12:00 AM     Problem: No plan of care established for management of chronic disease states (HTN, HLD, GERD)   Priority: High     Long-Range Goal: Development of plan of care for chronic disease management for HTN, HLD, GERD   Start Date: 12/27/2020  Expected End Date: 12/27/2021  Recent Progress: On track  Priority: High  Note:   Current Barriers:  Knowledge Deficits related to plan of care for management of HTN, HLD, GERD Chronic Disease Management support and education needs related to HTN, HLD, GERD  RNCM Clinical Goal(s):  Patient will verbalize basic understanding of  HTN, HLD, GERD disease process and self health management plan   take all medications exactly as prescribed and will call  provider for medication related questions attend all scheduled medical appointments: reschedule PCP physical demonstrate Improved health management independence   continue to work with RN Care Manager to address care management and care coordination needs related to  HTN, HLD, GERD will demonstrate ongoing self health care management ability    through collaboration with RN Care manager, provider, and care team.   Interventions: 1:1 collaboration with primary care provider regarding development and update of comprehensive plan of care as evidenced by provider attestation and co-signature Inter-disciplinary care team collaboration (see longitudinal plan of care) Evaluation of current treatment plan related to  self management and patient's adherence to plan as established by provider  Hyperlipidemia Interventions:  (Status:  Goal on track:  Yes.) Long Term Goal Medication review performed; medication list updated in electronic medical record.  Reviewed importance of limiting foods high in cholesterol Reviewed exercise goals and target of 150 minutes per week Mailed printed educational materials related to Hyperlipidemia management Mailed printed educational materials related to How to Perform Chair Exercises Discussed plans with patient for ongoing care management follow up and provided patient with direct contact information for care management team Lipid Panel     Component Value Date/Time   CHOL 254 (H) 05/15/2020 1141   TRIG 127 05/15/2020 1141   HDL 44 05/15/2020 1141   CHOLHDL 5.8 (H) 05/15/2020 1141   CHOLHDL 4.5 11/12/2015 1138   VLDL 17 11/12/2015 1138   LDLCALC 187 (H) 05/15/2020 1141   LDLDIRECT 138 (H) 07/02/2011 1521   LABVLDL 23 05/15/2020 1141   Cataract Removal Interventions:  (Status:  Goal on track:  Yes.)  Short Term Goal Evaluation of current treatment plan related to  Cataract Removal , self-management and patient's adherence to plan as established by  provider Determined patient completed her Cataract surgery with good results, although she is experiencing iritis  Determined patient is adhering to prescribed Prednisolone eye drops as directed with good effectiveness  Discussed the importance of keeping Dr. Katy Fitch well informed of changes to her eye or visual acuity and to keep all MD follow up appointments  Discussed plans with patient for ongoing care management follow up and provided patient with direct contact information for care management team  GERD Interventions:  (Status:  Goal on track:  Yes.)  Long Term Goal Evaluation of current treatment plan related to GERD, self-management and patient's adherence to plan as established by provider. Determined patient continues to have mild intermittent symptoms suggestive of GERD Determined patient would like to restart Dexilant and would like samples if available due to cost  Sent in basket message to PCP provider requesting samples of Dexilant if available  Reviewed and discussed dietary changes patient is making to help eliminate food/liquids that may trigger her GERD Discussed plans with patient for ongoing care management follow up and provided patient with direct contact information for care management team  Patient Goals/Self-Care Activities: Take all medications as prescribed Attend all scheduled provider appointments Call pharmacy for medication refills 3-7 days in advance of running out of medications Perform all self care activities independently  Perform IADL's (shopping,  preparing meals, housekeeping, managing finances) independently Call provider office for new concerns or questions  develop an exercise routine  Follow Up Plan:  Telephone follow up appointment with care management team member scheduled for:  06/16/21      Barb Merino, RN, BSN, CCM Care Management Coordinator [place] [facility]  Direct Phone:  [phone]

## 2021-04-22 ENCOUNTER — Other Ambulatory Visit: Payer: Self-pay | Admitting: Nurse Practitioner

## 2021-04-22 MED ORDER — FLUCONAZOLE 150 MG PO TABS: ORAL_TABLET | ORAL | 0 refills | Status: DC

## 2021-04-24 ENCOUNTER — Ambulatory Visit: Payer: Medicare HMO | Admitting: Nurse Practitioner

## 2021-04-29 DIAGNOSIS — E782 Mixed hyperlipidemia: Secondary | ICD-10-CM

## 2021-04-29 DIAGNOSIS — I10 Essential (primary) hypertension: Secondary | ICD-10-CM

## 2021-05-22 ENCOUNTER — Other Ambulatory Visit: Payer: Self-pay

## 2021-05-22 ENCOUNTER — Encounter: Payer: Self-pay | Admitting: Nurse Practitioner

## 2021-05-22 ENCOUNTER — Ambulatory Visit (INDEPENDENT_AMBULATORY_CARE_PROVIDER_SITE_OTHER): Payer: Medicare HMO | Admitting: Nurse Practitioner

## 2021-05-22 VITALS — BP 130/78 | HR 75 | Temp 98.2°F | Ht 64.0 in | Wt 178.0 lb

## 2021-05-22 DIAGNOSIS — Z23 Encounter for immunization: Secondary | ICD-10-CM | POA: Diagnosis not present

## 2021-05-22 DIAGNOSIS — Z6830 Body mass index (BMI) 30.0-30.9, adult: Secondary | ICD-10-CM | POA: Diagnosis not present

## 2021-05-22 DIAGNOSIS — I10 Essential (primary) hypertension: Secondary | ICD-10-CM

## 2021-05-22 DIAGNOSIS — N898 Other specified noninflammatory disorders of vagina: Secondary | ICD-10-CM

## 2021-05-22 DIAGNOSIS — E782 Mixed hyperlipidemia: Secondary | ICD-10-CM | POA: Diagnosis not present

## 2021-05-22 DIAGNOSIS — E6609 Other obesity due to excess calories: Secondary | ICD-10-CM

## 2021-05-22 DIAGNOSIS — R1013 Epigastric pain: Secondary | ICD-10-CM | POA: Diagnosis not present

## 2021-05-22 DIAGNOSIS — R32 Unspecified urinary incontinence: Secondary | ICD-10-CM

## 2021-05-22 LAB — POCT URINALYSIS DIPSTICK
Bilirubin, UA: NEGATIVE
Blood, UA: NEGATIVE
Glucose, UA: NEGATIVE
Ketones, UA: NEGATIVE
Leukocytes, UA: NEGATIVE
Nitrite, UA: NEGATIVE
Protein, UA: NEGATIVE
Spec Grav, UA: 1.025 (ref 1.010–1.025)
Urobilinogen, UA: 0.2 E.U./dL
pH, UA: 6 (ref 5.0–8.0)

## 2021-05-22 MED ORDER — PANTOPRAZOLE SODIUM 20 MG PO TBEC: 20.0000 mg | DELAYED_RELEASE_TABLET | Freq: Every day | ORAL | 2 refills | Status: DC

## 2021-05-22 MED ORDER — NYSTATIN 100000 UNIT/GM EX POWD: 1.0000 "application " | Freq: Three times a day (TID) | CUTANEOUS | 0 refills | Status: DC

## 2021-05-22 NOTE — Patient Instructions (Signed)

## 2021-05-22 NOTE — Progress Notes (Signed)
?Industrial/product designer as a Education administrator for [employer], FNP.,have documented all relevant documentation on the behalf of [name], FNP,as directed by  [name], FNP while in the presence of [name], [place]. ? ?This visit occurred during the SARS-CoV-2 public health emergency.  Safety protocols were in place, including screening questions prior to the visit, additional usage of staff PPE, and extensive cleaning of exam room while observing appropriate contact time as indicated for disinfecting solutions. ? ?Subjective:  ?  ? Patient ID: Wendy Mosley , female    DOB: [birth year] , 71 y.o.   MRN: [id number] ? ? ?Chief Complaint  ?Patient presents with  ? Hypertension  ? ? ?HPI ? ?Patient presents today for a blood pressure check.  Denies any concerns. She is still grieving the loss of her daughter who passed from [place] at the age of 56. She continues to drive a school bus. She is having vaginal irritation to her creases of her groin.  ? ?Hypertension ?This is a chronic problem. The current episode started more than 1 year ago. The problem is unchanged. The problem is controlled. Pertinent negatives include no anxiety, chest pain, headaches or palpitations. There are no associated agents to hypertension. Risk factors for coronary artery disease include sedentary lifestyle. Past treatments include nothing. Compliance problems include exercise and diet.  There is no history of chronic renal disease.   ? ?Past Medical History:  ?Diagnosis Date  ? CAD in native artery 06/06/2020  ? CAD native.  Calcium score 176, 85th percentile 04/2020  ? Cataracts, bilateral   ? right eye - just watching  ? Diverticulitis   ? GERD (gastroesophageal reflux disease) 2006  ? Hearing loss   ? right ear - no hearing aid  ? Heart attack [facility]) 2008  ? Hypertension   ? Irregular heart beat 1973  ? Palpitations 11/30/2019  ? SVD (spontaneous vaginal delivery)   ? x 3  ?  ? ?Family History  ?Problem Relation Age of Onset  ? Asthma Mother    ? Stroke Mother   ? COPD Mother   ? Heart failure Mother   ? Diabetes Sister   ? Cancer Maternal Grandmother   ?     Lukemia  ? Breast cancer Neg Hx   ? ? ? ?Current Outpatient Medications:  ?  aspirin 81 MG tablet, Take 81 mg by mouth every other day., Disp: , Rfl:  ?  B COMPLEX-ZINC PO, Take 1 tablet by mouth daily., Disp: , Rfl:  ?  clobetasol cream (TEMOVATE) 0.05 %, Apply to affected area twice as needed., Disp: 30 g, Rfl: 0 ?  diclofenac Sodium (VOLTAREN) 1 % GEL, Apply 2 g topically 4 (four) times daily., Disp: 100 g, Rfl: 3 ?  ibuprofen (ADVIL,MOTRIN) 200 MG tablet, Take 400-600 mg by mouth every 6 (six) hours as needed for moderate pain., Disp: , Rfl:  ?  loratadine (CLARITIN) 10 MG tablet, TAKE 1 TABLET BY MOUTH EVERY DAY, Disp: 90 tablet, Rfl: 1 ?  nystatin powder, Apply 1 application. topically 3 (three) times daily., Disp: 15 g, Rfl: 0 ?  pantoprazole (PROTONIX) 20 MG tablet, Take 1 tablet (20 mg total) by mouth daily., Disp: 30 tablet, Rfl: 2 ?  fluconazole (DIFLUCAN) 150 MG tablet, Take 1 tablet by mouth now then repeat in 5 days., Disp: 2 tablet, Rfl: 0  ? ?Allergies  ?Allergen Reactions  ? Sulfonamide Derivatives   ?  Rash/itching   ?  ? ?Review of Systems  ?  Constitutional: Negative.   ?Respiratory: Negative.    ?Cardiovascular: Negative.  Negative for chest pain, palpitations and leg swelling.  ?Gastrointestinal: Negative.   ?Neurological: Negative.  Negative for headaches.  ?Psychiatric/Behavioral: Negative.     ? ?Today's Vitals  ? 05/22/21 1126  ?BP: 130/78  ?Pulse: 75  ?Temp: 98.2 ?F (36.8 ?C)  ?TempSrc: Oral  ?Weight: 178 lb (80.7 kg)  ?Height: $RemoveB'5\' 4"'TyPlXrIi$  (1.626 m)  ? ?Body mass index is 30.55 kg/m?.  ?Wt Readings from Last 3 Encounters:  ?05/22/21 178 lb (80.7 kg)  ?11/07/20 167 lb (75.8 kg)  ?06/28/20 168 lb 6.4 oz (76.4 kg)  ? ? ?Objective:  ?Physical Exam ?Vitals reviewed.  ?Constitutional:   ?   General: She is not in acute distress. ?   Appearance: Normal appearance. She is normal weight.   ?Cardiovascular:  ?   Rate and Rhythm: Normal rate and regular rhythm.  ?   Pulses: Normal pulses.  ?   Heart sounds: Normal heart sounds. No murmur heard. ?Pulmonary:  ?   Effort: Pulmonary effort is normal. No respiratory distress.  ?   Breath sounds: Normal breath sounds. No wheezing.  ?Abdominal:  ?   General: Abdomen is flat. Bowel sounds are normal.  ?   Palpations: Abdomen is soft.  ?Musculoskeletal:     ?   General: Normal range of motion.  ?   Cervical back: Normal range of motion and neck supple.  ?Skin: ?   General: Skin is warm and dry.  ?   Capillary Refill: Capillary refill takes less than 2 seconds.  ?Neurological:  ?   General: No focal deficit present.  ?   Mental Status: She is alert and oriented to person, place, and time.  ?Psychiatric:     ?   Mood and Affect: Mood normal.     ?   Behavior: Behavior normal.     ?   Thought Content: Thought content normal.     ?   Judgment: Judgment normal.  ?  ? ?   ?Assessment And Plan:  ?   ?1. Essential hypertension ?Comments: Blood pressure is under good control. Continue current medications.  ? ?2. Mixed hyperlipidemia ?Comments: Will check lipid panel, continue statin tolerating well.  ?- CMP14+EGFR ?- Lipid panel ? ?3. Vaginal irritation ?Comments: She has darkened area to the creases of her groin.  ?- Hemoglobin A1c ?- nystatin powder; Apply 1 application. topically 3 (three) times daily.  Dispense: 15 g; Refill: 0 ? ?4. Need for shingles vaccine ?TransRx done and vaccine given in office ? ?5. Class 1 obesity due to excess calories with serious comorbidity and body mass index (BMI) of 30.0 to 30.9 in adult ? She is encouraged to strive for BMI less than 30 to decrease cardiac risk. Advised to aim for at least 150 minutes of exercise per week. ? ?6. Epigastric pain ?Comments: Omeprazole has been ineffective will try her on pantroprazole. Encouraged to limit foods that increase reflux ?- pantoprazole (PROTONIX) 20 MG tablet; Take 1 tablet (20 mg total) by  mouth daily.  Dispense: 30 tablet; Refill: 2 ? ?7. Urinary incontinence, unspecified type ?- POCT Urinalysis Dipstick (72257) ? ? ? ? ?Patient was given opportunity to ask questions. Patient verbalized understanding of the plan and was able to repeat key elements of the plan. All questions were answered to their satisfaction.  ?[name], FNP  ? ? ?I, [name], FNP, have reviewed all documentation for this visit. The documentation on 05/22/21  for the exam, diagnosis, procedures, and orders are all accurate and complete.  ? ?IF YOU HAVE BEEN REFERRED TO A SPECIALIST, IT MAY TAKE 1-2 WEEKS TO SCHEDULE/PROCESS THE REFERRAL. IF YOU HAVE NOT HEARD FROM US/SPECIALIST IN TWO WEEKS, PLEASE GIVE [place] A CALL AT [phone] X [phone].  ? ?THE PATIENT IS ENCOURAGED TO PRACTICE SOCIAL DISTANCING DUE TO THE COVID-19 PANDEMIC.   ?

## 2021-05-23 ENCOUNTER — Encounter: Payer: Self-pay | Admitting: Nurse Practitioner

## 2021-05-23 LAB — CMP14+EGFR
ALT: 26 IU/L (ref 0–32)
AST: 24 IU/L (ref 0–40)
Albumin/Globulin Ratio: 1.7 (ref 1.2–2.2)
Albumin: 4.4 g/dL (ref 3.8–4.8)
Alkaline Phosphatase: 168 IU/L — ABNORMAL HIGH (ref 44–121)
BUN/Creatinine Ratio: 11 — ABNORMAL LOW (ref 12–28)
BUN: 9 mg/dL (ref 8–27)
Bilirubin Total: 0.4 mg/dL (ref 0.0–1.2)
CO2: 25 mmol/L (ref 20–29)
Calcium: 9.9 mg/dL (ref 8.7–10.3)
Chloride: 101 mmol/L (ref 96–106)
Creatinine, Ser: 0.84 mg/dL (ref 0.57–1.00)
Globulin, Total: 2.6 g/dL (ref 1.5–4.5)
Glucose: 105 mg/dL — ABNORMAL HIGH (ref 70–99)
Potassium: 4.3 mmol/L (ref 3.5–5.2)
Sodium: 141 mmol/L (ref 134–144)
Total Protein: 7 g/dL (ref 6.0–8.5)
eGFR: 75 mL/min/{1.73_m2} (ref >59)

## 2021-05-23 LAB — LIPID PANEL
Chol/HDL Ratio: 5 ratio — ABNORMAL HIGH (ref 0.0–4.4)
Cholesterol, Total: 201 mg/dL — ABNORMAL HIGH (ref 100–199)
HDL: 40 mg/dL (ref >39)
LDL Chol Calc (NIH): 132 mg/dL — ABNORMAL HIGH (ref 0–99)
Triglycerides: 162 mg/dL — ABNORMAL HIGH (ref 0–149)
VLDL Cholesterol Cal: 29 mg/dL (ref 5–40)

## 2021-05-23 LAB — HEMOGLOBIN A1C
Est. average glucose Bld gHb Est-mCnc: 146 mg/dL
Hgb A1c MFr Bld: 6.7 % — ABNORMAL HIGH (ref 4.8–5.6)

## 2021-06-02 ENCOUNTER — Encounter: Payer: Self-pay | Admitting: Nurse Practitioner

## 2021-06-06 ENCOUNTER — Encounter: Payer: Self-pay | Admitting: Nurse Practitioner

## 2021-06-06 ENCOUNTER — Other Ambulatory Visit: Payer: Self-pay | Admitting: Nurse Practitioner

## 2021-06-06 DIAGNOSIS — E11620 Type 2 diabetes mellitus with diabetic dermatitis: Secondary | ICD-10-CM

## 2021-06-06 MED ORDER — NYSTATIN 100000 UNIT/GM EX CREA: 1.0000 "application " | TOPICAL_CREAM | Freq: Two times a day (BID) | CUTANEOUS | 0 refills | Status: DC

## 2021-06-15 ENCOUNTER — Emergency Department (HOSPITAL_COMMUNITY): Payer: Medicare HMO

## 2021-06-15 ENCOUNTER — Other Ambulatory Visit: Payer: Self-pay

## 2021-06-15 ENCOUNTER — Encounter (HOSPITAL_COMMUNITY): Payer: Self-pay | Admitting: Emergency Medicine

## 2021-06-15 ENCOUNTER — Observation Stay (HOSPITAL_COMMUNITY)
Admission: EM | Admit: 2021-06-15 | Discharge: 2021-06-16 | Disposition: A | Discharge status: Home / Self Care | Payer: Medicare HMO | Attending: Internal Medicine | Admitting: Internal Medicine

## 2021-06-15 DIAGNOSIS — Z720 Tobacco use: Secondary | ICD-10-CM | POA: Diagnosis present

## 2021-06-15 DIAGNOSIS — R079 Chest pain, unspecified: Secondary | ICD-10-CM | POA: Diagnosis present

## 2021-06-15 DIAGNOSIS — E7800 Pure hypercholesterolemia, unspecified: Secondary | ICD-10-CM

## 2021-06-15 DIAGNOSIS — R002 Palpitations: Secondary | ICD-10-CM | POA: Diagnosis present

## 2021-06-15 DIAGNOSIS — R000 Tachycardia, unspecified: Secondary | ICD-10-CM | POA: Diagnosis not present

## 2021-06-15 DIAGNOSIS — E1169 Type 2 diabetes mellitus with other specified complication: Secondary | ICD-10-CM

## 2021-06-15 DIAGNOSIS — E119 Type 2 diabetes mellitus without complications: Secondary | ICD-10-CM | POA: Insufficient documentation

## 2021-06-15 DIAGNOSIS — I5041 Acute combined systolic (congestive) and diastolic (congestive) heart failure: Secondary | ICD-10-CM | POA: Insufficient documentation

## 2021-06-15 DIAGNOSIS — Z79899 Other long term (current) drug therapy: Secondary | ICD-10-CM | POA: Insufficient documentation

## 2021-06-15 DIAGNOSIS — Z20822 Contact with and (suspected) exposure to covid-19: Secondary | ICD-10-CM | POA: Insufficient documentation

## 2021-06-15 DIAGNOSIS — E785 Hyperlipidemia, unspecified: Secondary | ICD-10-CM | POA: Diagnosis present

## 2021-06-15 DIAGNOSIS — I471 Supraventricular tachycardia, unspecified: Secondary | ICD-10-CM | POA: Diagnosis present

## 2021-06-15 DIAGNOSIS — I110 Hypertensive heart disease with heart failure: Secondary | ICD-10-CM | POA: Diagnosis not present

## 2021-06-15 DIAGNOSIS — I10 Essential (primary) hypertension: Secondary | ICD-10-CM | POA: Diagnosis not present

## 2021-06-15 DIAGNOSIS — Z7982 Long term (current) use of aspirin: Secondary | ICD-10-CM | POA: Diagnosis not present

## 2021-06-15 DIAGNOSIS — I959 Hypotension, unspecified: Secondary | ICD-10-CM | POA: Insufficient documentation

## 2021-06-15 DIAGNOSIS — Z87891 Personal history of nicotine dependence: Secondary | ICD-10-CM | POA: Insufficient documentation

## 2021-06-15 DIAGNOSIS — I2510 Atherosclerotic heart disease of native coronary artery without angina pectoris: Secondary | ICD-10-CM | POA: Diagnosis present

## 2021-06-15 DIAGNOSIS — R069 Unspecified abnormalities of breathing: Secondary | ICD-10-CM | POA: Diagnosis not present

## 2021-06-15 DIAGNOSIS — I6381 Other cerebral infarction due to occlusion or stenosis of small artery: Secondary | ICD-10-CM | POA: Diagnosis not present

## 2021-06-15 DIAGNOSIS — R200 Anesthesia of skin: Secondary | ICD-10-CM | POA: Diagnosis present

## 2021-06-15 DIAGNOSIS — R0789 Other chest pain: Secondary | ICD-10-CM | POA: Diagnosis not present

## 2021-06-15 DIAGNOSIS — R531 Weakness: Secondary | ICD-10-CM | POA: Diagnosis not present

## 2021-06-15 DIAGNOSIS — I200 Unstable angina: Secondary | ICD-10-CM

## 2021-06-15 DIAGNOSIS — I639 Cerebral infarction, unspecified: Secondary | ICD-10-CM | POA: Diagnosis not present

## 2021-06-15 DIAGNOSIS — I5042 Chronic combined systolic (congestive) and diastolic (congestive) heart failure: Secondary | ICD-10-CM | POA: Diagnosis present

## 2021-06-15 DIAGNOSIS — R5383 Other fatigue: Secondary | ICD-10-CM | POA: Diagnosis not present

## 2021-06-15 LAB — DIFFERENTIAL
Abs Immature Granulocytes: 0.04 10*3/uL (ref 0.00–0.07)
Basophils Absolute: 0 10*3/uL (ref 0.0–0.1)
Basophils Relative: 0 %
Eosinophils Absolute: 0.1 10*3/uL (ref 0.0–0.5)
Eosinophils Relative: 2 %
Immature Granulocytes: 1 %
Lymphocytes Relative: 37 %
Lymphs Abs: 2.7 10*3/uL (ref 0.7–4.0)
Monocytes Absolute: 0.5 10*3/uL (ref 0.1–1.0)
Monocytes Relative: 7 %
Neutro Abs: 3.8 10*3/uL (ref 1.7–7.7)
Neutrophils Relative %: 53 %

## 2021-06-15 LAB — CBC
HCT: 40.5 % (ref 36.0–46.0)
Hemoglobin: 12.6 g/dL (ref 12.0–15.0)
MCH: 23 pg — ABNORMAL LOW (ref 26.0–34.0)
MCHC: 31.1 g/dL (ref 30.0–36.0)
MCV: 74 fL — ABNORMAL LOW (ref 80.0–100.0)
Platelets: 388 10*3/uL (ref 150–400)
RBC: 5.47 MIL/uL — ABNORMAL HIGH (ref 3.87–5.11)
RDW: 16.5 % — ABNORMAL HIGH (ref 11.5–15.5)
WBC: 7.2 10*3/uL (ref 4.0–10.5)
nRBC: 0 % (ref 0.0–0.2)

## 2021-06-15 LAB — BRAIN NATRIURETIC PEPTIDE
B Natriuretic Peptide: 42.9 pg/mL (ref 0.0–100.0)
B Natriuretic Peptide: 163.8 pg/mL — ABNORMAL HIGH (ref 0.0–100.0)

## 2021-06-15 LAB — URINALYSIS, ROUTINE W REFLEX MICROSCOPIC
Bilirubin Urine: NEGATIVE
Glucose, UA: NEGATIVE mg/dL
Hgb urine dipstick: NEGATIVE
Ketones, ur: NEGATIVE mg/dL
Nitrite: NEGATIVE
Protein, ur: NEGATIVE mg/dL
Specific Gravity, Urine: 1.005 (ref 1.005–1.030)
pH: 6 (ref 5.0–8.0)

## 2021-06-15 LAB — I-STAT CHEM 8, ED
BUN: 13 mg/dL (ref 8–23)
Calcium, Ion: 1.12 mmol/L — ABNORMAL LOW (ref 1.15–1.40)
Chloride: 106 mmol/L (ref 98–111)
Creatinine, Ser: 1.1 mg/dL — ABNORMAL HIGH (ref 0.44–1.00)
Glucose, Bld: 144 mg/dL — ABNORMAL HIGH (ref 70–99)
HCT: 43 % (ref 36.0–46.0)
Hemoglobin: 14.6 g/dL (ref 12.0–15.0)
Potassium: 3.8 mmol/L (ref 3.5–5.1)
Sodium: 140 mmol/L (ref 135–145)
TCO2: 22 mmol/L (ref 22–32)

## 2021-06-15 LAB — COMPREHENSIVE METABOLIC PANEL
ALT: 20 U/L (ref 0–44)
AST: 24 U/L (ref 15–41)
Albumin: 3.7 g/dL (ref 3.5–5.0)
Alkaline Phosphatase: 109 U/L (ref 38–126)
Anion gap: 11 (ref 5–15)
BUN: 12 mg/dL (ref 8–23)
CO2: 21 mmol/L — ABNORMAL LOW (ref 22–32)
Calcium: 9.2 mg/dL (ref 8.9–10.3)
Chloride: 107 mmol/L (ref 98–111)
Creatinine, Ser: 1.2 mg/dL — ABNORMAL HIGH (ref 0.44–1.00)
GFR, Estimated: 49 mL/min — ABNORMAL LOW (ref >60)
Glucose, Bld: 146 mg/dL — ABNORMAL HIGH (ref 70–99)
Potassium: 3.7 mmol/L (ref 3.5–5.1)
Sodium: 139 mmol/L (ref 135–145)
Total Bilirubin: 0.7 mg/dL (ref 0.3–1.2)
Total Protein: 7.2 g/dL (ref 6.5–8.1)

## 2021-06-15 LAB — LIPID PANEL
Cholesterol: 193 mg/dL (ref 0–200)
HDL: 32 mg/dL — ABNORMAL LOW (ref >40)
LDL Cholesterol: 146 mg/dL — ABNORMAL HIGH (ref 0–99)
Total CHOL/HDL Ratio: 6 RATIO
Triglycerides: 74 mg/dL (ref <150)
VLDL: 15 mg/dL (ref 0–40)

## 2021-06-15 LAB — MAGNESIUM: Magnesium: 2.1 mg/dL (ref 1.7–2.4)

## 2021-06-15 LAB — TROPONIN I (HIGH SENSITIVITY)
Troponin I (High Sensitivity): 20 ng/L — ABNORMAL HIGH (ref <18)
Troponin I (High Sensitivity): 33 ng/L — ABNORMAL HIGH (ref <18)
Troponin I (High Sensitivity): 112 ng/L (ref <18)
Troponin I (High Sensitivity): 215 ng/L (ref <18)
Troponin I (High Sensitivity): 226 ng/L (ref <18)
Troponin I (High Sensitivity): 196 ng/L (ref <18)
Troponin I (High Sensitivity): 181 ng/L (ref <18)

## 2021-06-15 LAB — TSH
TSH: 3.642 u[IU]/mL (ref 0.350–4.500)
TSH: 1.538 u[IU]/mL (ref 0.350–4.500)

## 2021-06-15 LAB — RESP PANEL BY RT-PCR (FLU A&B, COVID) ARPGX2
Influenza A by PCR: NEGATIVE
Influenza B by PCR: NEGATIVE
SARS Coronavirus 2 by RT PCR: NEGATIVE

## 2021-06-15 LAB — LACTIC ACID, PLASMA
Lactic Acid, Venous: 2 mmol/L (ref 0.5–1.9)
Lactic Acid, Venous: 1.2 mmol/L (ref 0.5–1.9)

## 2021-06-15 LAB — HEMOGLOBIN A1C
Hgb A1c MFr Bld: 6.4 % — ABNORMAL HIGH (ref 4.8–5.6)
Mean Plasma Glucose: 136.98 mg/dL

## 2021-06-15 LAB — CBG MONITORING, ED
Glucose-Capillary: 131 mg/dL — ABNORMAL HIGH (ref 70–99)
Glucose-Capillary: 93 mg/dL (ref 70–99)

## 2021-06-15 LAB — PROTIME-INR
INR: 1.1 (ref 0.8–1.2)
Prothrombin Time: 13.7 seconds (ref 11.4–15.2)

## 2021-06-15 LAB — APTT: aPTT: 34 seconds (ref 24–36)

## 2021-06-15 MED ORDER — GADOBUTROL 1 MMOL/ML IV SOLN
10.0000 mL | Freq: Once | INTRAVENOUS | Status: AC | PRN
  Administered 2021-06-15: 10 mL via INTRAVENOUS

## 2021-06-15 MED ORDER — ACETAMINOPHEN 325 MG PO TABS: 650.0000 mg | ORAL_TABLET | ORAL | Status: DC | PRN

## 2021-06-15 MED ORDER — MORPHINE SULFATE (PF) 2 MG/ML IV SOLN: 2.0000 mg | INTRAVENOUS | Status: DC | PRN

## 2021-06-15 MED ORDER — NITROGLYCERIN 0.4 MG SL SUBL
0.4000 mg | SUBLINGUAL_TABLET | SUBLINGUAL | Status: DC | PRN
  Administered 2021-06-15: 0.4 mg via SUBLINGUAL
  Filled 2021-06-15: qty 1

## 2021-06-15 MED ORDER — HEPARIN (PORCINE) 25000 UT/250ML-% IV SOLN
950.0000 [IU]/h | INTRAVENOUS | Status: DC
  Administered 2021-06-15: 950 [IU]/h via INTRAVENOUS
  Filled 2021-06-15: qty 250

## 2021-06-15 MED ORDER — HEPARIN BOLUS VIA INFUSION
4000.0000 [IU] | Freq: Once | INTRAVENOUS | Status: AC
  Administered 2021-06-15: 4000 [IU] via INTRAVENOUS
  Filled 2021-06-15: qty 4000

## 2021-06-15 MED ORDER — ASPIRIN 81 MG PO CHEW
324.0000 mg | CHEWABLE_TABLET | ORAL | Status: DC
  Filled 2021-06-15: qty 4

## 2021-06-15 MED ORDER — ASPIRIN 81 MG PO CHEW
324.0000 mg | CHEWABLE_TABLET | Freq: Once | ORAL | Status: AC
  Administered 2021-06-15: 324 mg via ORAL
  Filled 2021-06-15: qty 4

## 2021-06-15 MED ORDER — ONDANSETRON HCL 4 MG/2ML IJ SOLN: 4.0000 mg | Freq: Four times a day (QID) | INTRAMUSCULAR | Status: DC | PRN

## 2021-06-15 MED ORDER — HEPARIN (PORCINE) 25000 UT/250ML-% IV SOLN: 1150.0000 [IU]/h | INTRAVENOUS | Status: DC

## 2021-06-15 MED ORDER — ASPIRIN 300 MG RE SUPP: 300.0000 mg | RECTAL | Status: DC

## 2021-06-15 MED ORDER — METOPROLOL TARTRATE 25 MG PO TABS
12.5000 mg | ORAL_TABLET | Freq: Two times a day (BID) | ORAL | Status: DC
  Administered 2021-06-16 (×2): 12.5 mg via ORAL
  Filled 2021-06-15 (×2): qty 1

## 2021-06-15 MED ORDER — METOPROLOL TARTRATE 5 MG/5ML IV SOLN: 5.0000 mg | Freq: Three times a day (TID) | INTRAVENOUS | Status: DC | PRN

## 2021-06-15 MED ORDER — INSULIN ASPART 100 UNIT/ML IJ SOLN: 0.0000 [IU] | INTRAMUSCULAR | Status: DC

## 2021-06-15 MED ORDER — SODIUM CHLORIDE 0.9% FLUSH
3.0000 mL | Freq: Once | INTRAVENOUS | Status: AC
  Administered 2021-06-15: 3 mL via INTRAVENOUS

## 2021-06-15 MED ORDER — LACTATED RINGERS IV BOLUS
1000.0000 mL | Freq: Once | INTRAVENOUS | Status: AC
  Administered 2021-06-15: 1000 mL via INTRAVENOUS

## 2021-06-15 MED ORDER — ASPIRIN EC 81 MG PO TBEC
81.0000 mg | DELAYED_RELEASE_TABLET | Freq: Every day | ORAL | Status: DC
  Administered 2021-06-16: 81 mg via ORAL
  Filled 2021-06-15: qty 1

## 2021-06-15 NOTE — ED Notes (Signed)
Last trop at 2200 ?

## 2021-06-15 NOTE — ED Provider Notes (Signed)
?[facility] ?Provider Note ? ? ?CSN: [id number] ?Arrival date & time: 06/15/21  0801 ? ?  ? ?History ? ?Chief Complaint  ?Patient presents with  ? Weakness  ? Numbness  ? ? ?Wendy Mosley is a 71 y.o. female. ? ? ?Weakness ?Patient presenting for generalized weakness.  Onset was this morning upon wakening up.  She states that she felt normal at 10 PM.  Medical history includes GERD, HTN, HLD, arthritis, CAD.  When she went to stand up this morning, she experienced bilateral leg weakness.  She also states that she "felt funny" on the left side of her body.  Currently, she continues to endorse generalized weakness and fatigue.  She has had a recent diagnosis of diabetes and has not been started on any medications for this.  She does state that she has been urinating a lot.  She denies any current areas of pain.  She denies any current shortness of breath. ? ?History per husband: This morning, patient did not have any difficulty with speech or any facial asymmetry.  He did not notice any unilateral weakness.  He did, however, noticed that she had difficulty with speech and facial asymmetry several days ago.  He also noticed that she had a right foot drag 1 week ago. ?HPI: A 71 year old patient with a history of CVA, treated diabetes, hypertension and hypercholesterolemia presents for evaluation of chest pain. Initial onset of pain was approximately 1-3 hours ago. The patient's chest pain is sharp and is not worse with exertion. The patient's chest pain is middle- or left-sided, is not well-localized, is not described as heaviness/pressure/tightness and does not radiate to the arms/jaw/neck. The patient does not complain of nausea and denies diaphoresis. The patient has no history of peripheral artery disease, has not smoked in the past 90 days, has no relevant family history of coronary artery disease (first degree relative at less than age 68) and does not have an elevated BMI  (>=30).  ? ?Home Medications ?Prior to Admission medications   ?Medication Sig Start Date End Date Taking? Authorizing Provider  ?alum & mag hydroxide-simeth (GELUSIL) 200-200-25 MG chewable tablet Chew 2 tablets by mouth 2 (two) times daily as needed for indigestion or heartburn.   Yes [provider]  ?aspirin 81 MG tablet Take 81 mg by mouth every other day.   Yes [provider]  ?B COMPLEX-ZINC PO Take 1 tablet by mouth daily.   Yes [provider]  ?clobetasol cream (TEMOVATE) 0.05 % Apply to affected area twice as needed. 12/10/20  Yes Ghumman, Ramandeep, NP  ?diclofenac Sodium (VOLTAREN) 1 % GEL Apply 2 g topically 4 (four) times daily. 05/15/20  Yes [name], FNP  ?ibuprofen (ADVIL,MOTRIN) 200 MG tablet Take 400-600 mg by mouth every 6 (six) hours as needed for moderate pain.   Yes [provider]  ?loratadine (CLARITIN) 10 MG tablet TAKE 1 TABLET BY MOUTH EVERY DAY ?Patient taking differently: Take 10 mg by mouth every other day. 04/04/21  Yes [name], FNP  ?nystatin cream (MYCOSTATIN) Apply 1 application. topically 2 (two) times daily. 06/06/21  Yes [name], FNP  ?fluconazole (DIFLUCAN) 150 MG tablet Take 1 tablet by mouth now then repeat in 5 days. ?Patient not taking: Reported on 06/15/2021 04/22/21   [name], [place]  ?nystatin powder Apply 1 application. topically 3 (three) times daily. ?Patient not taking: Reported on 06/15/2021 05/22/21   [name], [place]  ?pantoprazole (PROTONIX) 20 MG tablet Take  1 tablet (20 mg total) by mouth daily. ?Patient not taking: Reported on 06/15/2021 05/22/21   [name], [place]  ?   ? ?Allergies    ?Sulfonamide derivatives   ? ?Review of Systems   ?Review of Systems  ?Constitutional:  Positive for fatigue.  ?Cardiovascular:  Positive for palpitations.  ?Endocrine: Positive for polyuria.  ?Neurological:  Positive for weakness (generalized).  ?All other systems reviewed and are negative. ? ?Physical Exam ?Updated Vital  Signs ?BP (!) 149/89   Pulse 73   Temp 97.6 ?F (36.4 ?C) (Oral)   Resp 16   SpO2 98%  ?Physical Exam ?Vitals and nursing note reviewed.  ?Constitutional:   ?   General: She is not in acute distress. ?   Appearance: She is well-developed. She is ill-appearing. She is not toxic-appearing or diaphoretic.  ?HENT:  ?   Head: Normocephalic and atraumatic.  ?   Right Ear: External ear normal.  ?   Left Ear: External ear normal.  ?   Nose: Nose normal.  ?   Mouth/Throat:  ?   Mouth: Mucous membranes are moist.  ?   Pharynx: Oropharynx is clear.  ?Eyes:  ?   Extraocular Movements: Extraocular movements intact.  ?   Conjunctiva/sclera: Conjunctivae normal.  ?Cardiovascular:  ?   Rate and Rhythm: Regular rhythm. Tachycardia present.  ?   Heart sounds: No murmur heard. ?Pulmonary:  ?   Effort: Pulmonary effort is normal. No respiratory distress.  ?   Breath sounds: Normal breath sounds. No wheezing or rales.  ?Chest:  ?   Chest wall: No tenderness.  ?Abdominal:  ?   Palpations: Abdomen is soft.  ?   Tenderness: There is no abdominal tenderness.  ?Musculoskeletal:     ?   General: No swelling. Normal range of motion.  ?   Cervical back: Normal range of motion and neck supple. No rigidity.  ?   Right lower leg: No edema.  ?   Left lower leg: No edema.  ?Skin: ?   General: Skin is warm and dry.  ?   Capillary Refill: Capillary refill takes less than 2 seconds.  ?   Coloration: Skin is not jaundiced or pale.  ?Neurological:  ?   General: No focal deficit present.  ?   Mental Status: She is alert and oriented to person, place, and time.  ?   Cranial Nerves: No cranial nerve deficit.  ?   Sensory: No sensory deficit.  ?   Motor: No weakness.  ?   Coordination: Coordination normal.  ?Psychiatric:     ?   Mood and Affect: Mood normal.     ?   Behavior: Behavior normal.     ?   Thought Content: Thought content normal.     ?   Judgment: Judgment normal.  ? ? ?ED Results / Procedures / Treatments   ?Labs ?(all labs ordered are listed,  but only abnormal results are displayed) ?Labs Reviewed  ?CBC - Abnormal; Notable for the following components:  ?    Result Value  ? RBC 5.47 (*)   ? MCV 74.0 (*)   ? MCH 23.0 (*)   ? RDW 16.5 (*)   ? All other components within normal limits  ?COMPREHENSIVE METABOLIC PANEL - Abnormal; Notable for the following components:  ? CO2 21 (*)   ? Glucose, Bld 146 (*)   ? Creatinine, Ser 1.20 (*)   ? GFR, Estimated 49 (*)   ? All other  components within normal limits  ?LACTIC ACID, PLASMA - Abnormal; Notable for the following components:  ? Lactic Acid, Venous 2.0 (*)   ? All other components within normal limits  ?URINALYSIS, ROUTINE W REFLEX MICROSCOPIC - Abnormal; Notable for the following components:  ? Color, Urine STRAW (*)   ? Leukocytes,Ua TRACE (*)   ? Bacteria, UA RARE (*)   ? All other components within normal limits  ?CBC - Abnormal; Notable for the following components:  ? Hemoglobin 10.6 (*)   ? HCT 34.8 (*)   ? MCV 74.0 (*)   ? MCH 22.6 (*)   ? RDW 16.3 (*)   ? All other components within normal limits  ?COMPREHENSIVE METABOLIC PANEL - Abnormal; Notable for the following components:  ? Glucose, Bld 107 (*)   ? Total Protein 6.4 (*)   ? Albumin 3.3 (*)   ? All other components within normal limits  ?BRAIN NATRIURETIC PEPTIDE - Abnormal; Notable for the following components:  ? B Natriuretic Peptide 163.8 (*)   ? All other components within normal limits  ?LIPID PANEL - Abnormal; Notable for the following components:  ? HDL 32 (*)   ? LDL Cholesterol 146 (*)   ? All other components within normal limits  ?HEPARIN LEVEL (UNFRACTIONATED) - Abnormal; Notable for the following components:  ? Heparin Unfractionated 0.18 (*)   ? All other components within normal limits  ?HEMOGLOBIN A1C - Abnormal; Notable for the following components:  ? Hgb A1c MFr Bld 6.4 (*)   ? All other components within normal limits  ?I-STAT CHEM 8, ED - Abnormal; Notable for the following components:  ? Creatinine, Ser 1.10 (*)   ?  Glucose, Bld 144 (*)   ? Calcium, Ion 1.12 (*)   ? All other components within normal limits  ?CBG MONITORING, ED - Abnormal; Notable for the following components:  ? Glucose-Capillary 131 (*)   ? All other components

## 2021-06-15 NOTE — Progress Notes (Signed)
ANTICOAGULATION CONSULT NOTE - Initial Consult ? ?Pharmacy Consult for heparin ?Indication: chest pain/ACS ? ?Allergies  ?Allergen Reactions  ? Sulfonamide Derivatives   ?  Rash/itching   ? ? ?Patient Measurements: ?  ?Heparin Dosing Weight: 72 kg  ? ?Vital Signs: ?Temp: 97.6 ?F (36.4 ?C) (04/16 7793) ?Temp Source: Oral (04/16 9030) ?BP: 129/72 (04/16 1510) ?Pulse Rate: 84 (04/16 1510) ? ?Labs: ?Recent Labs  ?  06/15/21 ?0923 06/15/21 ?3007 06/15/21 ?6226 06/15/21 ?1023 06/15/21 ?1356  ?HGB 12.6 14.6  --   --   --   ?HCT 40.5 43.0  --   --   --   ?PLT 388  --   --   --   --   ?APTT 34  --   --   --   --   ?LABPROT 13.7  --   --   --   --   ?INR 1.1  --   --   --   --   ?CREATININE 1.20* 1.10*  --   --   --   ?TROPONINIHS  --   --  20* 33* 112*  ? ? ?CrCl cannot be calculated (Unknown ideal weight.). ? ? ?Medical History: ?Past Medical History:  ?Diagnosis Date  ? CAD in native artery 06/06/2020  ? CAD native.  Calcium score 176, 85th percentile 04/2020  ? Cataracts, bilateral   ? right eye - just watching  ? Diverticulitis   ? GERD (gastroesophageal reflux disease) 2006  ? Hearing loss   ? right ear - no hearing aid  ? Heart attack [facility]) 2008  ? Hypertension   ? Irregular heart beat 1973  ? Palpitations 11/30/2019  ? SVD (spontaneous vaginal delivery)   ? x 3  ? ? ?Medications:  ?(Not in a hospital admission)  ? ?Assessment: ?56 YOF who presented with generalized weakness with rising troponins concerning for ACS. Pharmacy consulted to start IV heparin.  ? ?H/H and Plt wnl, SCr mildly elevated  ? ?Goal of Therapy:  ?Heparin level 0.3-0.7 units/ml ?Monitor platelets by anticoagulation protocol: Yes ?  ?Plan:  ?-Heparin 4000 units IV bolus followed by heparin infusion at 950 units/hr  ?-F/u 8 hr HL ?-Monitor daily HL, CBC and s/s of bleeding  ? ?Albertina Parr, PharmD., BCCCP ?Clinical Pharmacist ?Please refer to [facility] for unit-specific pharmacist  ? ? ? ?

## 2021-06-15 NOTE — ED Notes (Signed)
Dietary called to bring pt a tray ?

## 2021-06-15 NOTE — Consult Note (Signed)
?Cardiology Consultation:  ? ?Patient ID: Wendy Mosley ?MRN: [id number]; DOB: Dec 31, 1950 ? ?Admit date: 06/15/2021 ?Date of Consult: 06/15/2021 ? ?PCP:  Glendale Chard, MD ?  ?[facility] Providers ?Cardiologist:  None   { ? ?Patient Profile:  ? ?Wendy Mosley is a 71 y.o. female with a history of coronary calcifications, palpitations, hypertension, lipidemia, GERD, and prior tobacco use who is being seen 06/15/2021 for the evaluation of elevated troponin at the request of Dr. Maryan Rued. ? ?History of Present Illness:  ? ?[name] is a 71 year old female with the above history who is followed by Dr. Oval Linsey.  Patient underwent coronary calcium score in 04/2020 which was elevated at 176 (85 with percentile for age and sex) with calcifications noted in the left main, LAD, and LCx. ? ?Patient presented to the ED today evaluation of weakness and left-sided numbness.  Upon arrival to the ED, patient tachycardic with rates as high as 150s and hypotensive with BP of 81/59.  EKG showed SVT, IVCD, rate 156 bpm, with no acute ischemic changes.  High-sensitivity troponin elevated at 20 >> 33 >> 112.  BNP normal.  Chest  x-ray showed no acute findings.  Head CT showed evidence of old basal ganglia lacunar infarct but no acute findings.  Brain MRI showed remote hemorrhagic infarct of the left caudate head and internal capsule but no acute findings. WBC 7.2, Hgb 12.6, Plts 388. Na 139, K 3.7, Glucose 146, BUN 12, Cr 1.20.  Mag 2.1.  Lactic acid 2.0. Blood cultures pending.  She was given IV fluids with improvement in tachycardia. She did not initially endorse chest pain for me however towards the end of my exam she noted an ache under her left breast, and later for the hospital physician endorsed severe sharp stabbing pain earlier in the day.  ? ? ?Past Medical History:  ?Diagnosis Date  ? CAD in native artery 06/06/2020  ? CAD native.  Calcium score 176, 85th percentile 04/2020  ? Cataracts, bilateral   ? right eye - just watching   ? Diverticulitis   ? GERD (gastroesophageal reflux disease) 2006  ? Hearing loss   ? right ear - no hearing aid  ? Heart attack [facility]) 2008  ? Hypertension   ? Irregular heart beat 1973  ? Palpitations 11/30/2019  ? SVD (spontaneous vaginal delivery)   ? x 3  ? ? ?Past Surgical History:  ?Procedure Laterality Date  ? APPENDECTOMY  1967  ? BREAST SURGERY    ? cyst removed from right breast at age 81  ? CHOLECYSTECTOMY  1975  ? COLONOSCOPY    ? DILATATION & CURETTAGE/HYSTEROSCOPY WITH MYOSURE N/A 02/21/2018  ? Procedure: DILATATION & CURETTAGE/HYSTEROSCOPY WITH MYOSURE RESECTION OF ENDOMETRIAL POLYP;  Surgeon: Servando Salina, MD;  Location: [facility];  Service: Gynecology;  Laterality: N/A;  ? EXTERNAL EAR SURGERY  1974  ? TUBAL LIGATION  1977  ?  ? ?Home Medications:  ?Prior to Admission medications   ?Medication Sig Start Date End Date Taking? Authorizing Provider  ?alum & mag hydroxide-simeth (GELUSIL) 200-200-25 MG chewable tablet Chew 2 tablets by mouth 2 (two) times daily as needed for indigestion or heartburn.   Yes [provider]  ?aspirin 81 MG tablet Take 81 mg by mouth every other day.   Yes [provider]  ?B COMPLEX-ZINC PO Take 1 tablet by mouth daily.   Yes [provider]  ?clobetasol cream (TEMOVATE) 0.05 % Apply to affected area twice as needed. 12/10/20  Yes  Bary Castilla, NP  ?diclofenac Sodium (VOLTAREN) 1 % GEL Apply 2 g topically 4 (four) times daily. 05/15/20  Yes [name], FNP  ?ibuprofen (ADVIL,MOTRIN) 200 MG tablet Take 400-600 mg by mouth every 6 (six) hours as needed for moderate pain.   Yes [provider]  ?loratadine (CLARITIN) 10 MG tablet TAKE 1 TABLET BY MOUTH EVERY DAY ?Patient taking differently: Take 10 mg by mouth every other day. 04/04/21  Yes [name], FNP  ?nystatin cream (MYCOSTATIN) Apply 1 application. topically 2 (two) times daily. 06/06/21  Yes [name], FNP  ?fluconazole (DIFLUCAN) 150 MG tablet Take 1 tablet by mouth  now then repeat in 5 days. ?Patient not taking: Reported on 06/15/2021 04/22/21   [name], [place]  ?nystatin powder Apply 1 application. topically 3 (three) times daily. ?Patient not taking: Reported on 06/15/2021 05/22/21   [name], [place]  ?pantoprazole (PROTONIX) 20 MG tablet Take 1 tablet (20 mg total) by mouth daily. ?Patient not taking: Reported on 06/15/2021 05/22/21   [name], [place]  ? ? ?Inpatient Medications: ?Scheduled Meds: ? aspirin  324 mg Oral NOW  ? Or  ? aspirin  300 mg Rectal NOW  ? [START ON 06/16/2021] aspirin EC  81 mg Oral Daily  ? insulin aspart  0-9 Units Subcutaneous Q4H  ? metoprolol tartrate  12.5 mg Oral BID  ? ?Continuous Infusions: ? heparin    ? ?PRN Meds: ? ? ?Allergies:    ?Allergies  ?Allergen Reactions  ? Sulfonamide Derivatives   ?  Rash/itching   ? ? ?Social History:   ?Social History  ? ?Socioeconomic History  ? Marital status: Married  ?  Spouse name: Not on file  ? Number of children: Not on file  ? Years of education: Not on file  ? Highest education level: Not on file  ?Occupational History  ? Not on file  ?Tobacco Use  ? Smoking status: Former  ?  Packs/day: 1.00  ?  Years: 30.00  ?  Pack years: 30.00  ?  Types: Cigarettes  ?  Quit date: 02/01/2011  ?  Years since quitting: 10.3  ? Smokeless tobacco: Never  ?Vaping Use  ? Vaping Use: Never used  ?Substance and Sexual Activity  ? Alcohol use: Not Currently  ?  Comment: occasional glass of wine  ? Drug use: No  ?  Comment: No IV drug  ? Sexual activity: Not Currently  ?  Birth control/protection: Post-menopausal  ?Other Topics Concern  ? Not on file  ?Social History Narrative  ? Mining engineer  ? Lives in [place]  ? [place]  ? Legally Separated to Second Husband  ?   ?   ? ?Social Determinants of Health  ? ?Financial Resource Strain: Medium Risk  ? Difficulty of Paying Living Expenses: Somewhat hard  ?Food Insecurity: No Food Insecurity  ? Worried About Charity fundraiser in the Last Year: Never true   ? Ran Out of Food in the Last Year: Never true  ?Transportation Needs: No Transportation Needs  ? Lack of Transportation (Medical): No  ? Lack of Transportation (Non-Medical): No  ?Physical Activity: Insufficiently Active  ? Days of Exercise per Week: 2 days  ? Minutes of Exercise per Session: 10 min  ?Stress: No Stress Concern Present  ? Feeling of Stress : Not at all  ?Social Connections: Not on file  ?Intimate Partner Violence: Not on file  ?  ?Family History:   ? ?Family History  ?  Problem Relation Age of Onset  ? Asthma Mother   ? Stroke Mother   ? COPD Mother   ? Heart failure Mother   ? Diabetes Sister   ? Cancer Maternal Grandmother   ?     Lukemia  ? Breast cancer Neg Hx   ?  ? ?ROS:  ?Please see the history of present illness.  ? ?All other ROS reviewed and negative.    ? ?Physical Exam/Data:  ? ?Vitals:  ? 06/15/21 1930 06/15/21 2030 06/15/21 2130 06/15/21 2200  ?BP: (!) 154/88 110/85 140/78   ?Pulse: 78 85 85 81  ?Resp: '15 18 20 19  '$ ?Temp:      ?TempSrc:      ?SpO2: 97% 97% 97% 98%  ? ? ?Intake/Output Summary (Last 24 hours) at 06/15/2021 2342 ?Last data filed at 06/15/2021 1236 ?Gross per 24 hour  ?Intake 2000 ml  ?Output --  ?Net 2000 ml  ? ? ?  05/22/2021  ? 11:26 AM 11/07/2020  ? 12:36 PM 06/28/2020  ?  9:51 AM  ?Last 3 Weights  ?Weight (lbs) 178 lb 167 lb 168 lb 6.4 oz  ?Weight (kg) 80.74 kg 75.751 kg 76.386 kg  ?   ?There is no height or weight on file to calculate BMI.  ?General:  Well nourished, well developed, in no acute distress ?HEENT: normal ?Neck: no JVD ?Vascular: No carotid bruits; Distal pulses 2+ bilaterally ?Cardiac:  normal S1, S2; RRR; no murmur  ?Lungs:  clear to auscultation bilaterally, no wheezing, rhonchi or rales  ?Abd: soft, nontender, no hepatomegaly  ?Ext: no edema ?Musculoskeletal:  No deformities, BUE and BLE strength normal and equal ?Skin: warm and dry  ?Neuro:  CNs 2-12 intact, no focal abnormalities noted ?Psych:  Normal affect  ? ?EKG:  The EKG was personally reviewed and  demonstrates:  SVT, subsequent SR, HR 86 ?Telemetry:  Telemetry was personally reviewed and demonstrates:  SR  ? ?Relevant CV Studies: ?Echo pending ? ?Laboratory Data: ? ?High Sensitivity Troponin:

## 2021-06-15 NOTE — H&P (Signed)
?[facility] ?History and Physical ? ?Wendy Mosley:[id number] DOB: [birth year] DOA: 06/15/2021 ? ?Referring physician:  ?PCP: Glendale Chard, MD  ? ?Chief Complaint: Chest pain ? ?HPI: Wendy Mosley is a 71 y.o. BF PMHx CAD native artery, MI, essential HTN, irregular heartbeat, HLD, palpitations, diverticulitis, ? ?Patient underwent coronary calcium score in 04/2020 which was elevated at 176 (85 with percentile for age and sex) with calcifications noted in the left main, LAD, and LCx. ? ?Patient presented to the ED today evaluation of weakness in left-sided numbness.  Upon arrival to the ED, patient tachycardic with rates as high as 150s and hypotensive with BP of 81/59.  EKG showed SVT, rate 156 bpm, with no acute ischemic changes.  High-sensitivity troponin elevated at 20 >> 33 >> 112.  BNP normal.  Chest  x-ray showed no acute findings.  Head CT showed evidence of old basal ganglia lacunar infarct but no acute findings.  Brain MRI showed remote hemorrhagic infarct of the left caudate head and internal capsule but no acute findings. WBC 7.2, Hgb 12.6, Plts 388. Na 139, K 3.7, Glucose 146, BUN 12, Cr 1.20.  Mag 2.1.  Lactic acid 2.0. Blood cultures pending.  She was given IV fluids with improvement in tachycardia. ? ? ? ?Review of Systems:  ?Covid vaccination; vaccinated 4/4 ? ?Constitutional:  ?No weight loss, night sweats, Fevers, chills, fatigue.  ?HEENT:  ?No headaches, Difficulty swallowing,Tooth/dental problems,Sore throat,  ?No sneezing, itching, ear ache, nasal congestion, post nasal drip,  ?Cardio-vascular:  ?Positive chest pain described as pins sticks left breast, negative radiation, Orthopnea, PND, swelling in lower extremities, anasarca, dizziness, Positivepalpitations  ?GI:  ?No heartburn, indigestion, abdominal pain, nausea, vomiting, diarrhea, change in bowel habits, loss of appetite  ?Resp:  ?Positive shortness of breath with exertion or at rest. No excess mucus, no productive cough, No  non-productive cough, No coughing up of blood.No change in color of mucus.No wheezing.No chest wall deformity  ?Skin:  ?no rash or lesions.  ?GU:  ?no dysuria, change in color of urine, no urgency or frequency. No flank pain.  ?Musculoskeletal:  ?No joint pain or swelling. No decreased range of motion. No back pain.  ?Psych:  ?No change in mood or affect. No depression,Positive anxiety. No memory loss.  ? ?Past Medical History:  ?Diagnosis Date  ? CAD in native artery 06/06/2020  ? CAD native.  Calcium score 176, 85th percentile 04/2020  ? Cataracts, bilateral   ? right eye - just watching  ? Diverticulitis   ? GERD (gastroesophageal reflux disease) 2006  ? Hearing loss   ? right ear - no hearing aid  ? Heart attack [facility]) 2008  ? Hypertension   ? Irregular heart beat 1973  ? Palpitations 11/30/2019  ? SVD (spontaneous vaginal delivery)   ? x 3  ? ?Past Surgical History:  ?Procedure Laterality Date  ? APPENDECTOMY  1967  ? BREAST SURGERY    ? cyst removed from right breast at age 54  ? CHOLECYSTECTOMY  1975  ? COLONOSCOPY    ? DILATATION & CURETTAGE/HYSTEROSCOPY WITH MYOSURE N/A 02/21/2018  ? Procedure: DILATATION & CURETTAGE/HYSTEROSCOPY WITH MYOSURE RESECTION OF ENDOMETRIAL POLYP;  Surgeon: Servando Salina, MD;  Location: [facility];  Service: Gynecology;  Laterality: N/A;  ? EXTERNAL EAR SURGERY  1974  ? TUBAL LIGATION  1977  ? ?Social History:  reports that she quit smoking about 10 years ago. Her smoking use included cigarettes. She has a 30.00 pack-year smoking history. She has  never used smokeless tobacco. She reports that she does not currently use alcohol. She reports that she does not use drugs. ? ?Allergies  ?Allergen Reactions  ? Sulfonamide Derivatives   ?  Rash/itching   ? ? ?Family History  ?Problem Relation Age of Onset  ? Asthma Mother   ? Stroke Mother   ? COPD Mother   ? Heart failure Mother   ? Diabetes Sister   ? Cancer Maternal Grandmother   ?     Lukemia  ? Breast cancer Neg Hx   ? ? ? ? ?Prior to  Admission medications   ?Medication Sig Start Date End Date Taking? Authorizing Provider  ?alum & mag hydroxide-simeth (GELUSIL) 200-200-25 MG chewable tablet Chew 2 tablets by mouth 2 (two) times daily as needed for indigestion or heartburn.   Yes [provider]  ?aspirin 81 MG tablet Take 81 mg by mouth every other day.   Yes [provider]  ?B COMPLEX-ZINC PO Take 1 tablet by mouth daily.   Yes [provider]  ?clobetasol cream (TEMOVATE) 0.05 % Apply to affected area twice as needed. 12/10/20  Yes Ghumman, Ramandeep, NP  ?diclofenac Sodium (VOLTAREN) 1 % GEL Apply 2 g topically 4 (four) times daily. 05/15/20  Yes [name], FNP  ?ibuprofen (ADVIL,MOTRIN) 200 MG tablet Take 400-600 mg by mouth every 6 (six) hours as needed for moderate pain.   Yes [provider]  ?loratadine (CLARITIN) 10 MG tablet TAKE 1 TABLET BY MOUTH EVERY DAY ?Patient taking differently: Take 10 mg by mouth every other day. 04/04/21  Yes [name], FNP  ?nystatin cream (MYCOSTATIN) Apply 1 application. topically 2 (two) times daily. 06/06/21  Yes [name], FNP  ?fluconazole (DIFLUCAN) 150 MG tablet Take 1 tablet by mouth now then repeat in 5 days. ?Patient not taking: Reported on 06/15/2021 04/22/21   [name], [place]  ?nystatin powder Apply 1 application. topically 3 (three) times daily. ?Patient not taking: Reported on 06/15/2021 05/22/21   [name], [place]  ?pantoprazole (PROTONIX) 20 MG tablet Take 1 tablet (20 mg total) by mouth daily. ?Patient not taking: Reported on 06/15/2021 05/22/21   [name], [place]  ? ? ? ?Consultants:  ?Cardiology ? ?Procedures/Significant Events:  ?4/16 CT head W0 contrast ?No acute intracranial abnormalities. ?2. Mild chronic microvascular ischemic changes in the cerebral white ?matter, and old left basal ganglia lacunar infarct, as above. ?4/16 MRI brain W/W0 contrast ?No acute intracranial abnormality. ?2. Remote hemorrhagic infarct of the left caudate head  and internal ?capsule. ?3. Periventricular and subcortical T2 hyperintensities bilaterally ?are moderately advanced for age. This likely reflects the sequela of ?chronic microvascular ischemia. ? ? ?I have personally reviewed and interpreted all radiology studies and my findings are as above. ? ? ?VENTILATOR SETTINGS: ? ? ? ?Cultures ?4/16 SARS coronavirus pending ? ? ?Antimicrobials: ? ? ? ?Devices ?  ? ?LINES / TUBES:  ? ? ? ? ?Continuous Infusions: ? heparin    ? ? ?Physical Exam: ?Vitals:  ? 06/15/21 1510 06/15/21 1630 06/15/21 1800 06/15/21 1900  ?BP: 129/72 (!) 146/82 (!) 149/90 (!) 143/86  ?Pulse: 84 86 85 79  ?Resp: '18 16 19 14  '$ ?Temp:      ?TempSrc:      ?SpO2: 100% 98% 97% 97%  ? ? ?Wt Readings from Last 3 Encounters:  ?05/22/21 80.7 kg  ?11/07/20 75.8 kg  ?06/28/20 76.4 kg  ? ? ?General: A/O x4, No acute respiratory distress ?Eyes: negative scleral hemorrhage, negative  anisocoria, negative icterus ?ENT: Negative Runny nose, negative gingival bleeding, ?Neck:  Negative scars, masses, torticollis, lymphadenopathy, JVD ?Lungs: Clear to auscultation bilaterally without wheezes or crackles ?Cardiovascular: Regular rate and rhythm without murmur gallop or rub normal S1 and S2, positive nonreproducible left chest pain (described as pins sticks), negative radiation ?Abdomen: Obese, negative abdominal pain, nondistended, positive soft, bowel sounds, no rebound, no ascites, no appreciable mass ?Extremities: No significant cyanosis, clubbing, or edema bilateral lower extremities ?Skin: Negative rashes, lesions, ulcers ?Psychiatric:  Negative depression, negative anxiety, negative fatigue, negative mania  ?Central nervous system:  Cranial nerves II through XII intact, tongue/uvula midline, all extremities muscle strength 5/5, sensation intact throughout, negative dysarthria, negative expressive aphasia, negative receptive aphasia. ?   ?   ? ?Labs on Admission:  ?Basic Metabolic Panel: ?Recent Labs  ?Lab  06/15/21 ?0254 06/15/21 ?2706 06/15/21 ?2376  ?NA 139 140  --   ?K 3.7 3.8  --   ?CL 107 106  --   ?CO2 21*  --   --   ?GLUCOSE 146* 144*  --   ?BUN 12 13  --   ?CREATININE 1.20* 1.10*  --   ?CALCIUM 9.2  --   --   ?

## 2021-06-15 NOTE — ED Notes (Signed)
Pt heparin gtt to continue to infuse per MD - MD asked to put in order again ?

## 2021-06-15 NOTE — ED Notes (Signed)
Patient transported to CT 

## 2021-06-15 NOTE — ED Triage Notes (Addendum)
Pt reports "heaviness" with weakness and numbness to L side of body since waking up this morning.  States she went to bed around 10pm.  No arm drift. No facial droop.  Speech clear.  Pt hypotensive and HR 150s. ?

## 2021-06-15 NOTE — ED Notes (Signed)
Both of pts IV either malpositioned or painful/infiltrated - Both Ivs removed. IV access reestablished - IV team consulted for second line ?

## 2021-06-15 NOTE — ED Notes (Signed)
Pt going to MRI

## 2021-06-16 ENCOUNTER — Observation Stay (HOSPITAL_COMMUNITY): Payer: Medicare HMO

## 2021-06-16 ENCOUNTER — Encounter (HOSPITAL_COMMUNITY): Payer: Self-pay | Admitting: Internal Medicine

## 2021-06-16 ENCOUNTER — Telehealth: Payer: Medicare HMO

## 2021-06-16 ENCOUNTER — Ambulatory Visit (INDEPENDENT_AMBULATORY_CARE_PROVIDER_SITE_OTHER): Payer: Medicare HMO

## 2021-06-16 ENCOUNTER — Observation Stay (HOSPITAL_BASED_OUTPATIENT_CLINIC_OR_DEPARTMENT_OTHER): Payer: Medicare HMO

## 2021-06-16 DIAGNOSIS — I10 Essential (primary) hypertension: Secondary | ICD-10-CM

## 2021-06-16 DIAGNOSIS — E119 Type 2 diabetes mellitus without complications: Secondary | ICD-10-CM | POA: Diagnosis not present

## 2021-06-16 DIAGNOSIS — I5021 Acute systolic (congestive) heart failure: Secondary | ICD-10-CM | POA: Diagnosis not present

## 2021-06-16 DIAGNOSIS — I5041 Acute combined systolic (congestive) and diastolic (congestive) heart failure: Secondary | ICD-10-CM

## 2021-06-16 DIAGNOSIS — I471 Supraventricular tachycardia: Secondary | ICD-10-CM

## 2021-06-16 DIAGNOSIS — E079 Disorder of thyroid, unspecified: Secondary | ICD-10-CM | POA: Diagnosis not present

## 2021-06-16 DIAGNOSIS — E7800 Pure hypercholesterolemia, unspecified: Secondary | ICD-10-CM

## 2021-06-16 DIAGNOSIS — E782 Mixed hyperlipidemia: Secondary | ICD-10-CM

## 2021-06-16 DIAGNOSIS — I2510 Atherosclerotic heart disease of native coronary artery without angina pectoris: Secondary | ICD-10-CM

## 2021-06-16 DIAGNOSIS — I700 Atherosclerosis of aorta: Secondary | ICD-10-CM

## 2021-06-16 DIAGNOSIS — I200 Unstable angina: Secondary | ICD-10-CM | POA: Diagnosis not present

## 2021-06-16 DIAGNOSIS — I959 Hypotension, unspecified: Secondary | ICD-10-CM | POA: Diagnosis not present

## 2021-06-16 DIAGNOSIS — K219 Gastro-esophageal reflux disease without esophagitis: Secondary | ICD-10-CM

## 2021-06-16 DIAGNOSIS — R002 Palpitations: Secondary | ICD-10-CM

## 2021-06-16 DIAGNOSIS — R079 Chest pain, unspecified: Secondary | ICD-10-CM

## 2021-06-16 DIAGNOSIS — E785 Hyperlipidemia, unspecified: Secondary | ICD-10-CM | POA: Diagnosis not present

## 2021-06-16 DIAGNOSIS — Z720 Tobacco use: Secondary | ICD-10-CM

## 2021-06-16 LAB — CBC
HCT: 34.8 % — ABNORMAL LOW (ref 36.0–46.0)
HCT: 41.3 % (ref 36.0–46.0)
Hemoglobin: 10.6 g/dL — ABNORMAL LOW (ref 12.0–15.0)
Hemoglobin: 12.9 g/dL (ref 12.0–15.0)
MCH: 22.6 pg — ABNORMAL LOW (ref 26.0–34.0)
MCH: 23 pg — ABNORMAL LOW (ref 26.0–34.0)
MCHC: 30.5 g/dL (ref 30.0–36.0)
MCHC: 31.2 g/dL (ref 30.0–36.0)
MCV: 74 fL — ABNORMAL LOW (ref 80.0–100.0)
MCV: 73.6 fL — ABNORMAL LOW (ref 80.0–100.0)
Platelets: 308 10*3/uL (ref 150–400)
Platelets: 226 10*3/uL (ref 150–400)
RBC: 4.7 MIL/uL (ref 3.87–5.11)
RBC: 5.61 MIL/uL — ABNORMAL HIGH (ref 3.87–5.11)
RDW: 16.3 % — ABNORMAL HIGH (ref 11.5–15.5)
RDW: 16.5 % — ABNORMAL HIGH (ref 11.5–15.5)
WBC: 6.2 10*3/uL (ref 4.0–10.5)
WBC: 5.4 10*3/uL (ref 4.0–10.5)
nRBC: 0 % (ref 0.0–0.2)
nRBC: 0 % (ref 0.0–0.2)

## 2021-06-16 LAB — COMPREHENSIVE METABOLIC PANEL
ALT: 19 U/L (ref 0–44)
AST: 21 U/L (ref 15–41)
Albumin: 3.3 g/dL — ABNORMAL LOW (ref 3.5–5.0)
Alkaline Phosphatase: 92 U/L (ref 38–126)
Anion gap: 5 (ref 5–15)
BUN: 8 mg/dL (ref 8–23)
CO2: 26 mmol/L (ref 22–32)
Calcium: 9 mg/dL (ref 8.9–10.3)
Chloride: 109 mmol/L (ref 98–111)
Creatinine, Ser: 0.84 mg/dL (ref 0.44–1.00)
GFR, Estimated: >60 mL/min (ref >60)
Glucose, Bld: 107 mg/dL — ABNORMAL HIGH (ref 70–99)
Potassium: 3.6 mmol/L (ref 3.5–5.1)
Sodium: 140 mmol/L (ref 135–145)
Total Bilirubin: 1.1 mg/dL (ref 0.3–1.2)
Total Protein: 6.4 g/dL — ABNORMAL LOW (ref 6.5–8.1)

## 2021-06-16 LAB — ECHOCARDIOGRAM COMPLETE
Area-P 1/2: 3.48 cm2
Calc EF: 49.3 %
S' Lateral: 2.9 cm
Single Plane A2C EF: 51.5 %
Single Plane A4C EF: 51.3 %

## 2021-06-16 LAB — CBG MONITORING, ED
Glucose-Capillary: 103 mg/dL — ABNORMAL HIGH (ref 70–99)
Glucose-Capillary: 107 mg/dL — ABNORMAL HIGH (ref 70–99)
Glucose-Capillary: 112 mg/dL — ABNORMAL HIGH (ref 70–99)
Glucose-Capillary: 89 mg/dL (ref 70–99)
Glucose-Capillary: 98 mg/dL (ref 70–99)

## 2021-06-16 LAB — PHOSPHORUS: Phosphorus: 4 mg/dL (ref 2.5–4.6)

## 2021-06-16 LAB — D-DIMER, QUANTITATIVE: D-Dimer, Quant: 0.37 ug/mL-FEU (ref 0.00–0.50)

## 2021-06-16 LAB — HEPARIN LEVEL (UNFRACTIONATED): Heparin Unfractionated: 0.18 IU/mL — ABNORMAL LOW (ref 0.30–0.70)

## 2021-06-16 LAB — HIV ANTIBODY (ROUTINE TESTING W REFLEX): HIV Screen 4th Generation wRfx: NONREACTIVE

## 2021-06-16 LAB — MAGNESIUM: Magnesium: 1.9 mg/dL (ref 1.7–2.4)

## 2021-06-16 MED ORDER — METFORMIN HCL 500 MG PO TABS: 500.0000 mg | ORAL_TABLET | Freq: Two times a day (BID) | ORAL | Status: DC

## 2021-06-16 MED ORDER — ATORVASTATIN CALCIUM 40 MG PO TABS: 80.0000 mg | ORAL_TABLET | Freq: Every day | ORAL | Status: DC

## 2021-06-16 MED ORDER — METOPROLOL TARTRATE 25 MG PO TABS: 25.0000 mg | ORAL_TABLET | Freq: Two times a day (BID) | ORAL | Status: DC

## 2021-06-16 MED ORDER — METOPROLOL TARTRATE 25 MG PO TABS: 25.0000 mg | ORAL_TABLET | Freq: Two times a day (BID) | ORAL | 0 refills | Status: DC

## 2021-06-16 MED ORDER — NITROGLYCERIN 0.4 MG SL SUBL
SUBLINGUAL_TABLET | SUBLINGUAL | Status: AC
  Filled 2021-06-16: qty 1

## 2021-06-16 MED ORDER — NITROGLYCERIN 0.4 MG SL SUBL
SUBLINGUAL_TABLET | SUBLINGUAL | Status: AC
  Filled 2021-06-16: qty 2

## 2021-06-16 MED ORDER — CLOTRIMAZOLE 1 % VA CREA: 1.0000 | TOPICAL_CREAM | Freq: Every day | VAGINAL | Status: DC

## 2021-06-16 MED ORDER — FLUCONAZOLE 200 MG PO TABS
200.0000 mg | ORAL_TABLET | Freq: Once | ORAL | Status: AC
Start: 2021-06-16 — End: 2021-06-16
  Administered 2021-06-16: 200 mg via ORAL
  Filled 2021-06-16: qty 1

## 2021-06-16 MED ORDER — NITROGLYCERIN 0.4 MG SL SUBL: 0.4000 mg | SUBLINGUAL_TABLET | SUBLINGUAL | 0 refills | Status: DC | PRN

## 2021-06-16 MED ORDER — IOHEXOL 350 MG/ML SOLN
100.0000 mL | Freq: Once | INTRAVENOUS | Status: AC | PRN
  Administered 2021-06-16: 100 mL via INTRAVENOUS

## 2021-06-16 MED ORDER — CLOTRIMAZOLE 1 % VA CREA: 1.0000 | TOPICAL_CREAM | Freq: Every day | VAGINAL | 0 refills | Status: DC

## 2021-06-16 MED ORDER — METOPROLOL TARTRATE 25 MG PO TABS
12.5000 mg | ORAL_TABLET | Freq: Once | ORAL | Status: AC
  Administered 2021-06-16: 12.5 mg via ORAL
  Filled 2021-06-16: qty 1

## 2021-06-16 MED ORDER — ROSUVASTATIN CALCIUM 20 MG PO TABS
40.0000 mg | ORAL_TABLET | Freq: Every day | ORAL | Status: DC
  Administered 2021-06-16: 40 mg via ORAL
  Filled 2021-06-16: qty 2

## 2021-06-16 MED ORDER — METFORMIN HCL 500 MG PO TABS: 500.0000 mg | ORAL_TABLET | Freq: Two times a day (BID) | ORAL | 0 refills | Status: DC

## 2021-06-16 MED ORDER — EZETIMIBE 10 MG PO TABS: 10.0000 mg | ORAL_TABLET | Freq: Every day | ORAL | 0 refills | Status: DC

## 2021-06-16 NOTE — Chronic Care Management (AMB) (Signed)
?Chronic Care Management  ? ?CCM RN Visit Note ? ?06/16/2021 ?Name: Wendy Mosley MRN: [id number] DOB: [birth year] ? ?Subjective: ?Wendy Mosley is a 71 y.o. year old female who is a primary care patient of [name], [place]. The care management team was consulted for assistance with disease management and care coordination needs.   ? ?Chart Review  for follow up visit in response to provider referral for case management and/or care coordination services.  ? ?Consent to Services:  ?The patient was given information about Chronic Care Management services, agreed to services, and gave verbal consent prior to initiation of services.  Please see initial visit note for detailed documentation.  ? ?Patient agreed to services and verbal consent obtained.  ? ?Assessment: Review of patient past medical history, allergies, medications, health status, including review of consultants reports, laboratory and other test data, was performed as part of comprehensive evaluation and provision of chronic care management services.  ? ?SDOH (Social Determinants of Health) assessments and interventions performed:   ? ?CCM Care Plan ? ?Allergies  ?Allergen Reactions  ? Lipitor [Atorvastatin] Other (See Comments)  ?  Hair loss  ? Sulfonamide Derivatives   ?  Rash/itching   ? ? ?Facility-Administered Encounter Medications as of 06/16/2021  ?Medication  ? acetaminophen (TYLENOL) tablet 650 mg  ? aspirin EC tablet 81 mg  ? clotrimazole (GYNE-LOTRIMIN) vaginal cream 1 Applicatorful  ? insulin aspart (novoLOG) injection 0-9 Units  ? metoprolol tartrate (LOPRESSOR) injection 5 mg  ? metoprolol tartrate (LOPRESSOR) tablet 25 mg  ? morphine (PF) 2 MG/ML injection 2 mg  ? nitroGLYCERIN (NITROSTAT) SL tablet 0.4 mg  ? ondansetron (ZOFRAN) injection 4 mg  ? rosuvastatin (CRESTOR) tablet 40 mg  ? ?Outpatient Encounter Medications as of 06/16/2021  ?Medication Sig  ? alum & mag hydroxide-simeth (GELUSIL) 200-200-25 MG chewable tablet Chew 2 tablets by  mouth 2 (two) times daily as needed for indigestion or heartburn.  ? aspirin 81 MG tablet Take 81 mg by mouth every other day.  ? B COMPLEX-ZINC PO Take 1 tablet by mouth daily.  ? clobetasol cream (TEMOVATE) 0.05 % Apply to affected area twice as needed.  ? diclofenac Sodium (VOLTAREN) 1 % GEL Apply 2 g topically 4 (four) times daily.  ? fluconazole (DIFLUCAN) 150 MG tablet Take 1 tablet by mouth now then repeat in 5 days. (Patient not taking: Reported on 06/15/2021)  ? ibuprofen (ADVIL,MOTRIN) 200 MG tablet Take 400-600 mg by mouth every 6 (six) hours as needed for moderate pain.  ? loratadine (CLARITIN) 10 MG tablet TAKE 1 TABLET BY MOUTH EVERY DAY (Patient taking differently: Take 10 mg by mouth every other day.)  ? nystatin cream (MYCOSTATIN) Apply 1 application. topically 2 (two) times daily.  ? nystatin powder Apply 1 application. topically 3 (three) times daily. (Patient not taking: Reported on 06/15/2021)  ? pantoprazole (PROTONIX) 20 MG tablet Take 1 tablet (20 mg total) by mouth daily. (Patient not taking: Reported on 06/15/2021)  ? ? ?Patient Active Problem List  ? Diagnosis Date Noted  ? Chest pain 06/15/2021  ? SVT (supraventricular tachycardia) ([place]) 06/15/2021  ? Benign essential HTN 06/15/2021  ? HLD (hyperlipidemia) 06/15/2021  ? Tobacco use 06/15/2021  ? Controlled type 2 diabetes mellitus without complication, without long-term current use of insulin ([place]) 06/15/2021  ? CAD in native artery 06/06/2020  ? Chronic right shoulder pain 05/15/2020  ? Palpitations 11/30/2019  ? Constipation 09/15/2018  ? Colonic polyp 12/24/2016  ? Hyperlipidemia 11/14/2015  ?  HTN (hypertension) 11/12/2015  ? Arthritis of right knee 11/12/2015  ? Dermatitis 11/12/2015  ? Allergic rhinitis 07/27/2012  ? Lichen sclerosus et atrophicus of the vulva 07/12/2011  ? GERD (gastroesophageal reflux disease) 07/02/2011  ? Postmenopausal atrophic vaginitis 07/02/2011  ? ? ?Conditions to be addressed/monitored: HTN, HLD, GERD ? ?Care  Plan : RN Care Manager Plan of Care  ?Updates made by Lynne Logan, RN since 06/16/2021 12:00 AM  ?  ? ?Problem: No plan of care established for management of chronic disease states (HTN, HLD, GERD)   ?Priority: High  ?  ? ?Long-Range Goal: Development of plan of care for chronic disease management for HTN, HLD, GERD   ?Start Date: 12/27/2020  ?Expected End Date: 12/27/2021  ?Recent Progress: On track  ?Priority: High  ?Note:   ?Current Barriers:  ?Knowledge Deficits related to plan of care for management of HTN, HLD, GERD ?Chronic Disease Management support and education needs related to HTN, HLD, GERD ? ?RNCM Clinical Goal(s):  ?Patient will verbalize basic understanding of  HTN, HLD, GERD disease process and self health management plan   ?take all medications exactly as prescribed and will call provider for medication related questions ?attend all scheduled medical appointments: reschedule PCP physical ?demonstrate Improved health management independence   ?continue to work with RN Care Manager to address care management and care coordination needs related to  HTN, HLD, GERD ?will demonstrate ongoing self health care management ability    through collaboration with RN Care manager, provider, and care team.  ? ?Interventions: ?1:1 collaboration with primary care provider regarding development and update of comprehensive plan of care as evidenced by provider attestation and co-signature ?Inter-disciplinary care team collaboration (see longitudinal plan of care) ?Evaluation of current treatment plan related to  self management and patient's adherence to plan as established by provider ? ?Inpatient admission:  (Status:  New goal.)  Short Term Goal ?Per chart review in preparation to contact patient, noted patient is currently at [facility] with admission date: 06/15/21; dx: SVT (supraventricular tachycardia), Hypotension, Chest Pain ?Noted patient will continue to be monitored, Echocardiogram  pending, CT angiogram ordered, possible Cardiac catheretization  ? ?Patient Goals/Self-Care Activities: ?Take all medications as prescribed ?Attend all scheduled provider appointments ?Call pharmacy for medication refills 3-7 days in advance of running out of medications ?Perform all self care activities independently  ?Perform IADL's (shopping, preparing meals, housekeeping, managing finances) independently ?Call provider office for new concerns or questions  ?develop an exercise routine ? ?Follow Up Plan:  The care management team will reach out to the patient upon discharge home to assess for CCM needs  ? ?  ? ?Barb Merino, RN, BSN, CCM ?Care Management Coordinator ?[facility]  ?Direct Phone: [phone] ? ?

## 2021-06-16 NOTE — Progress Notes (Signed)
?  Echocardiogram ?2D Echocardiogram has been performed. ? ?Fidel Levy ?06/16/2021, 2:00 PM ?

## 2021-06-16 NOTE — Discharge Summary (Signed)
Physician Discharge Summary  ?KASHAYLA UNGERER RXV:[id number] DOB: 1950/10/02 DOA: 06/15/2021 ? ?PCP: [name], FNP ? ?Admit date: 06/15/2021 ?Discharge date: 06/16/2021 ? ?Time spent: 35 minutes ? ?Recommendations for Outpatient Follow-up:  ? ?Acute systolic and diastolic CHF ?-See chest pain ? ?Chest pain ?-Hold all nodal blocking agents patient had relative hypotension. ?-Patient currently NSR ?- Heparin drip overnight. ?- Cardiology consulted: Currently plan is for coronary cta in the a.m. ?- Echocardiogram ?- Trend troponin ?  Latest Reference Range & Units 06/15/21 13:56 06/15/21 16:25 06/15/21 17:17 06/15/21 20:01 06/15/21 21:53  ?Troponin I (High Sensitivity) <18 ng/L 112 (HH) 215 (HH) 226 (HH) 196 (HH) 181 (HH)  ?(HH): Data is critically high ?-4/17 per cardiology cleared for discharge. ?-4/17 ?-Follow-up with Dr. Skeet Latch Cardiology on 4 May@ 0800 ?  ?SVT ?-Currently NSR ?-See chest pain ? ?Essential HTN ?-See chest pain ? ?CAD ?-See chest pain ?- Lipid panel pending ? ?Palpitations ?  ?Diabetes type 2 controlled ?- 3/23 Hemoglobin A1c= 6.7 ?- 4/17 hemoglobin A1c = 6.4  ?- Sensitive SSI ?-Prior to discharge would start patient on metformin clearly has prediabetes vs diabetes.  PCP to titrate medication ?-Metformin 500 mg BID.  Follow-up with PCP for titration of medication. ?  ?HLD ?- 4/16 LDL = 146: Goal <70  ?- Zetia 10 mg daily (patient allergic to statins) ? ? ? ?Discharge Diagnoses:  ?Principal Problem: ?  Chest pain ?Active Problems: ?  Palpitations ?  CAD in native artery ?  SVT (supraventricular tachycardia) ([place]) ?  Benign essential HTN ?  HLD (hyperlipidemia) ?  Tobacco use ?  Controlled type 2 diabetes mellitus without complication, without long-term current use of insulin (Wendy Mosley) ? ? ?Discharge Condition: Stable ? ?Diet recommendation: Heart healthy/carb modified ? ?There were no vitals filed for this visit. ? ?History of present illness:  ?Wendy Mosley is a 71 y.o. BF PMHx CAD native  artery, MI, essential HTN, irregular heartbeat, HLD, palpitations, diverticulitis, ?  ?Patient underwent coronary calcium score in 04/2020 which was elevated at 176 (85 with percentile for age and sex) with calcifications noted in the left main, LAD, and LCx. ?  ?Patient presented to the ED today evaluation of weakness in left-sided numbness.  Upon arrival to the ED, patient tachycardic with rates as high as 150s and hypotensive with BP of 81/59.  EKG showed SVT, rate 156 bpm, with no acute ischemic changes.  High-sensitivity troponin elevated at 20 >> 33 >> 112.  BNP normal.  Chest  x-ray showed no acute findings.  Head CT showed evidence of old basal ganglia lacunar infarct but no acute findings.  Brain MRI showed remote hemorrhagic infarct of the left caudate head and internal capsule but no acute findings. WBC 7.2, Hgb 12.6, Plts 388. Na 139, K 3.7, Glucose 146, BUN 12, Cr 1.20.  Mag 2.1.  Lactic acid 2.0. Blood cultures pending.  She was given IV fluids with improvement in tachycardia. ? ?Hospital Course:  ?See above ? ?Procedures: ?4/16 MRI ?-No acute intracranial abnormality. ?-Remote hemorrhagic infarct of the left caudate head and internal ?capsule. ?-Periventricular and subcortical T2 hyperintensities bilaterally ?are moderately advanced for age. This likely reflects the sequela of ?chronic microvascular ischemia. ?4/17 CTA coronary morphology ?1. Minimal nonobstructive CAD, CADRADS = 2. ?2. Coronary calcium score of 238. This was 87th percentile for age ?and sex matched control. ? 3. Normal coronary origin with left dominance. ? 4. Aortic atherosclerosis, with likely mural thrombus in abdominal ?Aorta ?4/17 Echocardiogram ?Left  Ventricle: LVEF= 45 to 50%. The left ventricle has mildly decreased function. The left  ?ventricle demonstrates global hypokinesis.  ?-Diastolic parameters are consistent with Grade I diastolic dysfunction (impaired relaxation).  ?  ? ? ?Consultations: ?[facility] ? ? ?Cultures  ?4/16 influenza A/B negative ?4/16 SARS coronavirus negative ? ? ? ?Discharge Exam: ?Vitals:  ? 06/16/21 1300 06/16/21 1400 06/16/21 1540 06/16/21 1600  ?BP: (!) 156/92 (!) 144/87 127/78 115/70  ?Pulse: 63 66 64 66  ?Resp: '14 17 18 '$ (!) 24  ?Temp:      ?TempSrc:      ?SpO2: 99% 100% 97% 97%  ? ? ?General: A/O x4, No acute respiratory distress ?Eyes: negative scleral hemorrhage, negative anisocoria, negative icterus ?ENT: Negative Runny nose, negative gingival bleeding, ?Neck:  Negative scars, masses, torticollis, lymphadenopathy, JVD ?Lungs: Clear to auscultation bilaterally without wheezes or crackles ?Cardiovascular: Regular rate and rhythm without murmur gallop or rub normal S1 and S2,  ? ?Discharge Instructions ? ? ?Allergies as of 06/16/2021   ? ?   Reactions  ? Lipitor [atorvastatin] Other (See Comments)  ? Hair loss  ? Sulfonamide Derivatives   ? Rash/itching   ? ?  ? ?  ?Medication List  ?  ? ?STOP taking these medications   ? ?alum & mag hydroxide-simeth 200-200-25 MG chewable tablet ?Commonly known as: GELUSIL ?  ?fluconazole 150 MG tablet ?Commonly known as: Diflucan ?  ?pantoprazole 20 MG tablet ?Commonly known as: PROTONIX ?  ? ?  ? ?TAKE these medications   ? ?aspirin 81 MG tablet ?Take 81 mg by mouth every other day. ?  ?B COMPLEX-ZINC PO ?Take 1 tablet by mouth daily. ?  ?clobetasol cream 0.05 % ?Commonly known as: TEMOVATE ?Apply to affected area twice as needed. ?  ?clotrimazole 1 % vaginal cream ?Commonly known as: GYNE-LOTRIMIN ?Place 1 Applicatorful vaginally at bedtime. ?  ?diclofenac Sodium 1 % Gel ?Commonly known as: VOLTAREN ?Apply 2 g topically 4 (four) times daily. ?  ?ezetimibe 10 MG tablet ?Commonly known as: Zetia ?Take 1 tablet (10 mg total) by mouth daily. ?  ?ibuprofen 200 MG tablet ?Commonly known as: ADVIL ?Take 400-600 mg by mouth every 6 (six) hours as needed for moderate pain. ?  ?loratadine 10 MG tablet ?Commonly known as: CLARITIN ?TAKE 1 TABLET BY  MOUTH EVERY DAY ?What changed: when to take this ?  ?metFORMIN 500 MG tablet ?Commonly known as: GLUCOPHAGE ?Take 1 tablet (500 mg total) by mouth 2 (two) times daily with a meal. ?  ?metoprolol tartrate 25 MG tablet ?Commonly known as: LOPRESSOR ?Take 1 tablet (25 mg total) by mouth 2 (two) times daily. ?  ?nitroGLYCERIN 0.4 MG SL tablet ?Commonly known as: NITROSTAT ?Place 1 tablet (0.4 mg total) under the tongue every 5 (five) minutes x 3 doses as needed for chest pain. ?  ?nystatin cream ?Commonly known as: MYCOSTATIN ?Apply 1 application. topically 2 (two) times daily. ?What changed: Another medication with the same name was removed. Continue taking this medication, and follow the directions you see here. ?  ? ?  ? ?Allergies  ?Allergen Reactions  ? Lipitor [Atorvastatin] Other (See Comments)  ?  Hair loss  ? Sulfonamide Derivatives   ?  Rash/itching   ? ? Follow-up Information   ? ? Skeet Latch, MD Follow up on 07/03/2021.   ?Specialty: Cardiology ?Why: at 8:00 AM ?Contact information: ?[address] ?[place] [address] ?[phone] ? ? ?  ?  ? ?  ?  ? ?  ? ? ? ?  The results of significant diagnostics from this hospitalization (including imaging, microbiology, ancillary and laboratory) are listed below for reference.   ? ?Significant Diagnostic Studies: ?CT HEAD WO CONTRAST ? ?Result Date: 06/15/2021 ?CLINICAL DATA:  71 year old female with history of acute onset of neurologic deficit. Suspected stroke. EXAM: CT HEAD WITHOUT CONTRAST TECHNIQUE: Contiguous axial images were obtained from the base of the skull through the vertex without intravenous contrast. RADIATION DOSE REDUCTION: This exam was performed according to the departmental dose-optimization program which includes automated exposure control, adjustment of the mA and/or kV according to patient size and/or use of iterative reconstruction technique. COMPARISON:  No priors. FINDINGS: Brain: Patchy and confluent areas of decreased attenuation  are noted throughout the deep and periventricular white matter of the cerebral hemispheres bilaterally, compatible with chronic microvascular ischemic disease. Well-defined area of low attenuation in the head of the left caudate

## 2021-06-16 NOTE — ED Notes (Signed)
Pt verbalizes understanding of discharge instructions. Opportunity for questions and answers were provided. Pt discharged from the ED.   ?

## 2021-06-16 NOTE — Progress Notes (Addendum)
? ?Progress Note ? ?Patient Name: Wendy Mosley ?Date of Encounter: 06/16/2021 ? ?[facility] Cardiologist: Skeet Latch, MD  ? ?Subjective  ? ?No racing HR today, no chest pain or SOB.  Lipitor in past caused hair loss so stopped ? ?Inpatient Medications  ?  ?Scheduled Meds: ? aspirin  324 mg Oral NOW  ? Or  ? aspirin  300 mg Rectal NOW  ? aspirin EC  81 mg Oral Daily  ? insulin aspart  0-9 Units Subcutaneous Q4H  ? metoprolol tartrate  12.5 mg Oral BID  ? rosuvastatin  40 mg Oral Daily  ? ?Continuous Infusions: ? heparin 1,150 Units/hr (06/16/21 0237)  ? ?PRN Meds: ?acetaminophen, metoprolol tartrate, morphine injection, nitroGLYCERIN, ondansetron (ZOFRAN) IV  ? ?Vital Signs  ?  ?Vitals:  ? 06/16/21 0730 06/16/21 0815 06/16/21 0900 06/16/21 0945  ?BP: (!) 149/89 (!) 166/97 (!) 160/95 130/90  ?Pulse: 73 70 76 80  ?Resp: '16 16 14 16  '$ ?Temp:      ?TempSrc:      ?SpO2: 98% 98% 98% 97%  ? ? ?Intake/Output Summary (Last 24 hours) at 06/16/2021 1102 ?Last data filed at 06/16/2021 0617 ?Gross per 24 hour  ?Intake 1000 ml  ?Output 1000 ml  ?Net 0 ml  ? ? ?  05/22/2021  ? 11:26 AM 11/07/2020  ? 12:36 PM 06/28/2020  ?  9:51 AM  ?Last 3 Weights  ?Weight (lbs) 178 lb 167 lb 168 lb 6.4 oz  ?Weight (kg) 80.74 kg 75.751 kg 76.386 kg  ?   ? ?Telemetry  ?  ?SR - Personally Reviewed ? ?ECG  ?  ? SR at 77 no acute changes- Personally Reviewed ? ?Physical Exam  ? ?GEN: No acute distress.   ?Neck: No JVD ?Cardiac: RRR, no murmurs, rubs, or gallops.  ?Respiratory: Clear to auscultation bilaterally. ?GI: Soft, nontender, non-distended  ?MS: No edema; No deformity. ?Neuro:  Nonfocal  ?Psych: Normal affect  ? ?Labs  ?  ?High Sensitivity Troponin:   ?Recent Labs  ?Lab 06/15/21 ?1356 06/15/21 ?1625 06/15/21 ?1717 06/15/21 ?2001 06/15/21 ?2153  ?TROPONINIHS 112* 215* 226* 196* 181*  ?   ?Chemistry ?Recent Labs  ?Lab 06/15/21 ?0254 06/15/21 ?2706 06/15/21 ?2376 06/16/21 ?0439  ?NA 139 140  --  140  ?K 3.7 3.8  --  3.6  ?CL 107 106  --  109   ?CO2 21*  --   --  26  ?GLUCOSE 146* 144*  --  107*  ?BUN 12 13  --  8  ?CREATININE 1.20* 1.10*  --  0.84  ?CALCIUM 9.2  --   --  9.0  ?MG  --   --  2.1 1.9  ?PROT 7.2  --   --  6.4*  ?ALBUMIN 3.7  --   --  3.3*  ?AST 24  --   --  21  ?ALT 20  --   --  19  ?ALKPHOS 109  --   --  92  ?BILITOT 0.7  --   --  1.1  ?GFRNONAA 49*  --   --  >60  ?ANIONGAP 11  --   --  5  ?  ?Lipids  ?Recent Labs  ?Lab 06/15/21 ?1717  ?CHOL 193  ?TRIG 74  ?HDL 32*  ?LDLCALC 146*  ?CHOLHDL 6.0  ?  ?Hematology ?Recent Labs  ?Lab 06/15/21 ?2831 06/15/21 ?5176 06/16/21 ?0439  ?WBC 7.2  --  6.2  ?RBC 5.47*  --  4.70  ?HGB 12.6 14.6 10.6*  ?  HCT 40.5 43.0 34.8*  ?MCV 74.0*  --  74.0*  ?MCH 23.0*  --  22.6*  ?MCHC 31.1  --  30.5  ?RDW 16.5*  --  16.3*  ?PLT 388  --  308  ? ?Thyroid  ?Recent Labs  ?Lab 06/15/21 ?1713  ?TSH 1.538  ?  ?BNP ?Recent Labs  ?Lab 06/15/21 ?0842 06/15/21 ?1714  ?BNP 42.9 163.8*  ?  ?DDimer  ?Recent Labs  ?Lab 06/16/21 ?0125  ?DDIMER 0.37  ?  ? ?Radiology  ?  ?CT HEAD WO CONTRAST ? ?Result Date: 06/15/2021 ?CLINICAL DATA:  71 year old female with history of acute onset of neurologic deficit. Suspected stroke. EXAM: CT HEAD WITHOUT CONTRAST TECHNIQUE: Contiguous axial images were obtained from the base of the skull through the vertex without intravenous contrast. RADIATION DOSE REDUCTION: This exam was performed according to the departmental dose-optimization program which includes automated exposure control, adjustment of the mA and/or kV according to patient size and/or use of iterative reconstruction technique. COMPARISON:  No priors. FINDINGS: Brain: Patchy and confluent areas of decreased attenuation are noted throughout the deep and periventricular white matter of the cerebral hemispheres bilaterally, compatible with chronic microvascular ischemic disease. Well-defined area of low attenuation in the head of the left caudate nucleus and anterior limb of the left internal capsule, compatible with an old lacunar infarct.  No evidence of acute infarction, hemorrhage, hydrocephalus, extra-axial collection or mass lesion/mass effect. Vascular: No hyperdense vessel or unexpected calcification. Skull: Normal. Negative for fracture or focal lesion. Sinuses/Orbits: No acute finding. Other: None. IMPRESSION: 1. No acute intracranial abnormalities. 2. Mild chronic microvascular ischemic changes in the cerebral white matter, and old left basal ganglia lacunar infarct, as above. Electronically Signed   By: Vinnie Langton M.D.   On: 06/15/2021 08:38  ? ?MR Brain W and Wo Contrast ? ?Result Date: 06/15/2021 ?CLINICAL DATA:  Neuro deficit, acute, stroke suspected. Left-sided heaviness of upper and lower extremities. Dizziness. EXAM: MRI HEAD WITHOUT AND WITH CONTRAST TECHNIQUE: Multiplanar, multiecho pulse sequences of the brain and surrounding structures were obtained without and with intravenous contrast. CONTRAST:  18m GADAVIST GADOBUTROL 1 MMOL/ML IV SOLN COMPARISON:  CT head without contrast 06/15/2021 FINDINGS: Brain: No acute infarct, hemorrhage, or mass lesion is present. Periventricular and subcortical T2 hyperintensities are moderately advanced for age. The ventricles are of normal size. No significant extraaxial fluid collection is present. Remote hemorrhagic infarct present in the left caudate head and internal capsule with volume loss. The internal auditory canals are within normal limits. The brainstem and cerebellum are within normal limits. Postcontrast images demonstrate no pathologic enhancement. Vascular: Flow is present in the major intracranial arteries. Skull and upper cervical spine: The craniocervical junction is normal. Upper cervical spine is within normal limits. Marrow signal is unremarkable. Sinuses/Orbits: The paranasal sinuses and mastoid air cells are clear. Bilateral lens replacements are noted. Globes and orbits are otherwise unremarkable. IMPRESSION: 1. No acute intracranial abnormality. 2. Remote hemorrhagic  infarct of the left caudate head and internal capsule. 3. Periventricular and subcortical T2 hyperintensities bilaterally are moderately advanced for age. This likely reflects the sequela of chronic microvascular ischemia. Electronically Signed   By: CSan MorelleM.D.   On: 06/15/2021 15:26  ? ?DG Chest Portable 1 View ? ?Result Date: 06/15/2021 ?CLINICAL DATA:  Fatigue. Left-sided weakness. Hypotension. Tachycardia EXAM: PORTABLE CHEST 1 VIEW COMPARISON:  12/10/2014 FINDINGS: The heart size and mediastinal contours are within normal limits. Aortic atherosclerotic calcification noted. Both lungs are clear. The visualized skeletal structures  are unremarkable. IMPRESSION: No active disease. Electronically Signed   By: Marlaine Hind M.D.   On: 06/15/2021 09:24   ? ?Cardiac Studies  ? ?echo pending ?Cardiac CTA pending ? ?Patient Profile  ?   ?71 y.o. female history of coronary calcifications, palpitations, hypertension, lipidemia, GERD, and prior tobacco use admitted 06/15/21 with weakness and Lt sided numbness found to have SVT at 156. ?IV fluids with improvement in HR also with ache under lt breast. .  ? ?Assessment & Plan  ?  ?SVT no further episodes ? ?Chest pain - may be from SVT but has hx of CAD on Ca+ score which was 176.  PMH of MI in 2008? ?--for cardiac CTA today once has IVs -has been placed will order cCTA  will give another dose of BB ?--echo pending ?--pk troponin 226  ? ?HTN 156/95 and 130/90 on ASA and BB 12.5 BID ? ?HLD with LDL 146 not on statin will add crestor 40 - she had hair loss with lipitor  ? ?Pre-diabetes at 6.4 A1C  ? ? ?   ? ?For questions or updates, please contact [place] ?Please consult [email or web address] for contact info under  ? ?  ?   ?Signed, ?Cecilie Kicks, NP  ?06/16/2021, 11:02 AM   ? ?I have seen and examined the patient along with Cecilie Kicks, NP .  I have reviewed the chart, notes and new data.  I agree with PA/NP's note. ? ?Key new complaints: Reviewed her chest pain  complaint.  It was a very brief sharp discomfort lasting a fraction of a second.  It does not sound like angina pectoris.   ?Key examination changes: Normal cardiovascular exam, but blood pressure is consi

## 2021-06-16 NOTE — Progress Notes (Incomplete)
?PROGRESS NOTE ? ? ? ?Wendy Mosley  HQI:[id number] DOB: [birth year] DOA: 06/15/2021 ?PCP: [name], FNP  ? ?Brief Narrative:  ?Wendy Mosley is a 71 y.o. BF PMHx CAD native artery, MI, essential HTN, irregular heartbeat, HLD, palpitations, diverticulitis, ?  ?Patient underwent coronary calcium score in 04/2020 which was elevated at 176 (85 with percentile for age and sex) with calcifications noted in the left main, LAD, and LCx. ?  ?Patient presented to the ED today evaluation of weakness in left-sided numbness.  Upon arrival to the ED, patient tachycardic with rates as high as 150s and hypotensive with BP of 81/59.  EKG showed SVT, rate 156 bpm, with no acute ischemic changes.  High-sensitivity troponin elevated at 20 >> 33 >> 112.  BNP normal.  Chest  x-ray showed no acute findings.  Head CT showed evidence of old basal ganglia lacunar infarct but no acute findings.  Brain MRI showed remote hemorrhagic infarct of the left caudate head and internal capsule but no acute findings. WBC 7.2, Hgb 12.6, Plts 388. Na 139, K 3.7, Glucose 146, BUN 12, Cr 1.20.  Mag 2.1.  Lactic acid 2.0. Blood cultures pending.  She was given IV fluids with improvement in tachycardia. ? ? ?Subjective: ?4/17 afebrile overnight ? ? ?Assessment & Plan: ? Covid vaccination; ? ?Principal Problem: ?  Chest pain ?Active Problems: ?  Palpitations ?  CAD in native artery ?  SVT (supraventricular tachycardia) ([place]) ?  Benign essential HTN ?  HLD (hyperlipidemia) ?  Tobacco use ?  Controlled type 2 diabetes mellitus without complication, without long-term current use of insulin ([place]) ? ?Chest pain****** ?-Hold all nodal blocking agents patient had relative hypotension. ?-Patient currently NSR ?- Heparin drip overnight. ?- Cardiology consulted: Currently plan is for coronary cta in the a.m. ?- Echocardiogram pending ?- Trend troponin ? Latest Reference Range & Units 06/15/21 13:56 06/15/21 16:25 06/15/21 17:17 06/15/21 20:01 06/15/21 21:53   ?Troponin I (High Sensitivity) <18 ng/L 112 (HH) 215 (HH) 226 (HH) 196 (HH) 181 (HH)  ?(HH): Data is critically high ?-4/17 per cardiology patient to proceed to cardiac catheterization lab ?  ?SVT ?-Currently NSR ?-See chest pain ? ?Essential HTN ?-See chest pain ? ?CAD ?-See chest pain ?- Lipid panel pending ? ?Palpitations ?  ?Diabetes type 2 controlled******* ?- 3/23 Hemoglobin A1c= 6.7 ?- 4/17 hemoglobin A1c = 6.4  ?- Sensitive SSI ?-Prior to discharge would start patient on metformin clearly has prediabetes vs diabetes.  PCP to titrate medication ?  ?HLD ?-Lipid panel pending ? ?Tobacco use ?Mobility Assessment (last 72 hours)   ? ? Mobility Assessment   ?No documentation. ? ?  ?  ? ?  ? ? ?DVT prophylaxis: *** ?Code Status: *** ?Family Communication: *** ?Status is: Inpatient ? ?{Inpatient:23812} ? ?Dispo: The patient is from: {From:23814} ?             Anticipated d/c is to: {To:23815} ?             Anticipated d/c date is: {Days:23816} ?             Patient currently {Medically stable:23817} ? ? ? ? ? ?Consultants:  ?*** ? ?Procedures/Significant Events:  ?*** ? ? ?I have personally reviewed and interpreted all radiology studies and my findings are as above. ? ?VENTILATOR SETTINGS: ?*** ? ? ?Cultures ?4/16 influenza A/B negative ?4/16 SARS coronavirus negative ? ? ?Antimicrobials: ?*** ? ? ?Devices ?***  ? ?LINES / TUBES:  ?*** ? ? ? ?  Continuous Infusions: ? ? ?Objective: ?Vitals:  ? 06/16/21 0945 06/16/21 1030 06/16/21 1115 06/16/21 1227  ?BP: 130/90 (!) 156/95 (!) 159/88 (!) 149/86  ?Pulse: 80 75 68 66  ?Resp: '16 17 16 16  '$ ?Temp:      ?TempSrc:      ?SpO2: 97% 95% 98% 99%  ? ? ?Intake/Output Summary (Last 24 hours) at 06/16/2021 1312 ?Last data filed at 06/16/2021 0617 ?Gross per 24 hour  ?Intake --  ?Output 1000 ml  ?Net -1000 ml  ? ?There were no vitals filed for this visit. ? ?Examination: ? ?General: No acute respiratory distress ?Eyes: negative scleral hemorrhage, negative anisocoria, negative  icterus*** ?ENT: Negative Runny nose, negative gingival bleeding,*** ?Neck:  Negative scars, masses, torticollis, lymphadenopathy, JVD*** ?Lungs: Clear to auscultation bilaterally without wheezes or crackles ?Cardiovascular: Regular rate and rhythm without murmur gallop or rub normal S1 and S2 ?Abdomen: negative abdominal pain, nondistended, positive soft, bowel sounds, no rebound, no ascites, no appreciable mass ?Extremities: No significant cyanosis, clubbing, or edema bilateral lower extremities ?Skin: Negative rashes, lesions, ulcers*** ?Psychiatric:  Negative depression, negative anxiety, negative fatigue, negative mania *** ?Central nervous system:  Cranial nerves II through XII intact, tongue/uvula midline, all extremities muscle strength 5/5, sensation intact throughout, finger nose finger bilateral within normal limits, quick finger touch bilateral within normal limits, negative Romberg sign, heel to shin bilateral within normal limits, standing on 1 foot bilateral within normal limits, walking on tiptoes within normal limits, walking on heels within normal limits, negative dysarthria, negative expressive aphasia, negative receptive aphasia.*** ? ?.  ? ? ? ?Data Reviewed: Care during the described time interval was provided by me .  I have reviewed this patient's available data, including medical history, events of note, physical examination, and all test results as part of my evaluation.  ? ?CBC: ?Recent Labs  ?Lab 06/15/21 ?3086 06/15/21 ?5784 06/16/21 ?0439 06/16/21 ?1100  ?WBC 7.2  --  6.2 5.4  ?NEUTROABS 3.8  --   --   --   ?HGB 12.6 14.6 10.6* 12.9  ?HCT 40.5 43.0 34.8* 41.3  ?MCV 74.0*  --  74.0* 73.6*  ?PLT 388  --  308 226  ? ?Basic Metabolic Panel: ?Recent Labs  ?Lab 06/15/21 ?6962 06/15/21 ?9528 06/15/21 ?4132 06/16/21 ?0439  ?NA 139 140  --  140  ?K 3.7 3.8  --  3.6  ?CL 107 106  --  109  ?CO2 21*  --   --  26  ?GLUCOSE 146* 144*  --  107*  ?BUN 12 13  --  8  ?CREATININE 1.20* 1.10*  --  0.84   ?CALCIUM 9.2  --   --  9.0  ?MG  --   --  2.1 1.9  ?PHOS  --   --   --  4.0  ? ?GFR: ?CrCl cannot be calculated (Unknown ideal weight.). ?Liver Function Tests: ?Recent Labs  ?Lab 06/15/21 ?4401 06/16/21 ?0439  ?AST 24 21  ?ALT 20 19  ?ALKPHOS 109 92  ?BILITOT 0.7 1.1  ?PROT 7.2 6.4*  ?ALBUMIN 3.7 3.3*  ? ?No results for input(s): LIPASE, AMYLASE in the last 168 hours. ?No results for input(s): AMMONIA in the last 168 hours. ?Coagulation Profile: ?Recent Labs  ?Lab 06/15/21 ?0272  ?INR 1.1  ? ?Cardiac Enzymes: ?No results for input(s): CKTOTAL, CKMB, CKMBINDEX, TROPONINI in the last 168 hours. ?BNP (last 3 results) ?No results for input(s): PROBNP in the last 8760 hours. ?HbA1C: ?Recent Labs  ?  06/15/21 ?2001  ?  HGBA1C 6.4*  ? ?CBG: ?Recent Labs  ?Lab 06/15/21 ?2000 06/16/21 ?0030 06/16/21 ?0427 06/16/21 ?8185 06/16/21 ?1226  ?GLUCAP 93 112* 103* 107* 89  ? ?Lipid Profile: ?Recent Labs  ?  06/15/21 ?1717  ?CHOL 193  ?HDL 32*  ?LDLCALC 146*  ?TRIG 74  ?CHOLHDL 6.0  ? ?Thyroid Function Tests: ?Recent Labs  ?  06/15/21 ?1713  ?TSH 1.538  ? ?Anemia Panel: ?No results for input(s): VITAMINB12, FOLATE, FERRITIN, TIBC, IRON, RETICCTPCT in the last 72 hours. ?Urine analysis: ?   ?Component Value Date/Time  ? COLORURINE STRAW (A) 06/15/2021 2054  ? APPEARANCEUR CLEAR 06/15/2021 2054  ? LABSPEC 1.005 06/15/2021 2054  ? PHURINE 6.0 06/15/2021 2054  ? [place] NEGATIVE 06/15/2021 2054  ? [place] NEGATIVE 06/15/2021 2054  ? [place] NEGATIVE 06/15/2021 2054  ? BILIRUBINUR negative 05/22/2021 1643  ? [place] NEGATIVE 06/15/2021 2054  ? [place] NEGATIVE 06/15/2021 2054  ? UROBILINOGEN 0.2 05/22/2021 1643  ? NITRITE NEGATIVE 06/15/2021 2054  ? LEUKOCYTESUR TRACE (A) 06/15/2021 2054  ? ?Sepsis Labs: ?'@LABRCNTIP'$ (procalcitonin:4,lacticidven:4) ? ?) ?Recent Results (from the past 240 hour(s))  ?Blood culture (routine x 2)     Status: None (Preliminary result)  ? Collection Time: 06/15/21 10:38 AM  ? Specimen: BLOOD  ?Result Value  Ref Range Status  ? Specimen Description BLOOD RIGHT ANTECUBITAL  Final  ? Special Requests   Final  ?  BOTTLES DRAWN AEROBIC AND ANAEROBIC Blood Culture adequate volume  ? Culture   Final  ?  NO GROWTH < 24 HOURS ?Perf

## 2021-06-16 NOTE — ED Notes (Signed)
Patient transported to CT with Clarise Cruz, RN.  ?

## 2021-06-16 NOTE — Progress Notes (Signed)
ANTICOAGULATION CONSULT NOTE  ? ?Pharmacy Consult for heparin ?Indication: chest pain/ACS ? ?Allergies  ?Allergen Reactions  ? Sulfonamide Derivatives   ?  Rash/itching   ? ? ?Patient Measurements: ?  ?Heparin Dosing Weight: 72 kg  ? ?Vital Signs: ?BP: 161/66 (04/17 0120) ?Pulse Rate: 74 (04/17 0120) ? ?Labs: ?Recent Labs  ?  06/15/21 ?3875 06/15/21 ?6433 06/15/21 ?2951 06/15/21 ?1717 06/15/21 ?2001 06/15/21 ?2153 06/16/21 ?0125  ?HGB 12.6 14.6  --   --   --   --   --   ?HCT 40.5 43.0  --   --   --   --   --   ?PLT 388  --   --   --   --   --   --   ?APTT 34  --   --   --   --   --   --   ?LABPROT 13.7  --   --   --   --   --   --   ?INR 1.1  --   --   --   --   --   --   ?HEPARINUNFRC  --   --   --   --   --   --  0.18*  ?CREATININE 1.20* 1.10*  --   --   --   --   --   ?TROPONINIHS  --   --    < > 226* 196* 181*  --   ? < > = values in this interval not displayed.  ? ? ? ?CrCl cannot be calculated (Unknown ideal weight.). ? ? ?Medical History: ?Past Medical History:  ?Diagnosis Date  ? CAD in native artery 06/06/2020  ? CAD native.  Calcium score 176, 85th percentile 04/2020  ? Cataracts, bilateral   ? right eye - just watching  ? Diverticulitis   ? GERD (gastroesophageal reflux disease) 2006  ? Hearing loss   ? right ear - no hearing aid  ? Heart attack [facility]) 2008  ? Hypertension   ? Irregular heart beat 1973  ? Palpitations 11/30/2019  ? SVD (spontaneous vaginal delivery)   ? x 3  ? ? ?Medications:  ?(Not in a hospital admission) ? ?Assessment: ?72 YOF who presented with generalized weakness with rising troponins concerning for ACS. Pharmacy consulted to start IV heparin.  ? ?H/H and Plt wnl, SCr mildly elevated  ? ?Heparin level 0.18, RN reports the IV had to be moved earlier in shift, but only paused for ~15 minutes; Will increase rate and order follow up labs. No bleeding reported ? ?Goal of Therapy:  ?Heparin level 0.3-0.7 units/ml ?Monitor platelets by anticoagulation protocol: Yes ?  ?Plan:  ?-Increase heparin  infusion to 1150 units/hr  ?-F/u 8 hr Heparin level  ?-Monitor daily Heparin level , CBC and s/s of bleeding  ? ?Georga Bora, PharmD ?Clinical Pharmacist ?06/16/2021 2:31 AM ?Please check AMION for all [place] numbers ? ? ? ? ?

## 2021-06-17 ENCOUNTER — Telehealth: Payer: Self-pay

## 2021-06-17 DIAGNOSIS — I5042 Chronic combined systolic (congestive) and diastolic (congestive) heart failure: Secondary | ICD-10-CM

## 2021-06-17 DIAGNOSIS — I5041 Acute combined systolic (congestive) and diastolic (congestive) heart failure: Secondary | ICD-10-CM | POA: Diagnosis present

## 2021-06-17 HISTORY — DX: Chronic combined systolic (congestive) and diastolic (congestive) heart failure: I50.42

## 2021-06-17 NOTE — Telephone Encounter (Signed)
Transition Care Management Follow-up Telephone Call ?Date of discharge and from where: 06/16/2021 [facility] ?How have you been since you were released from the hospital? Pt states she is weak & sluggish ?Any questions or concerns? No ? ?Items Reviewed: ?Did the pt receive and understand the discharge instructions provided? Yes  ?Medications obtained and verified? Yes  ?Other? No  ?Any new allergies since your discharge? No  ?Dietary orders reviewed? Yes ?Do you have support at home? Yes  ? ?Home Care and Equipment/Supplies: ?Were home health services ordered? no ?If so, what is the name of the agency? N/a  ?Has the agency set up a time to come to the patient's home? no ?Were any new equipment or medical supplies ordered?  No ?What is the name of the medical supply agency? N/a ?Were you able to get the supplies/equipment? no ?Do you have any questions related to the use of the equipment or supplies? No ? ?Functional Questionnaire: (I = Independent and D = Dependent) ?ADLs: i ? ?Bathing/Dressing- i ? ?Meal Prep- i ? ?Eating- i ? ?Maintaining continence- i ? ?Transferring/Ambulation- i ? ?Managing Meds- i ? ?Follow up appointments reviewed: ? ?PCP Hospital f/u appt confirmed? Yes  Scheduled to see janece moore on 06/23/2021  @ 11:00am. ?[facility] f/u appt confirmed? No  Scheduled to see n/a on n/a @ n/a. ?Are transportation arrangements needed? No  ?If their condition worsens, is the pt aware to call PCP or go to the Emergency Dept.? Yes ?Was the patient provided with contact information for the PCP's office or ED? Yes ?Was to pt encouraged to call back with questions or concerns? Yes  ?

## 2021-06-20 LAB — CULTURE, BLOOD (ROUTINE X 2)
Culture: NO GROWTH
Culture: NO GROWTH
Special Requests: ADEQUATE
Special Requests: ADEQUATE

## 2021-06-23 ENCOUNTER — Telehealth (INDEPENDENT_AMBULATORY_CARE_PROVIDER_SITE_OTHER): Payer: Medicare HMO | Admitting: Nurse Practitioner

## 2021-06-23 ENCOUNTER — Encounter: Payer: Self-pay | Admitting: Nurse Practitioner

## 2021-06-23 DIAGNOSIS — I5042 Chronic combined systolic (congestive) and diastolic (congestive) heart failure: Secondary | ICD-10-CM | POA: Diagnosis not present

## 2021-06-23 DIAGNOSIS — L0291 Cutaneous abscess, unspecified: Secondary | ICD-10-CM | POA: Diagnosis not present

## 2021-06-23 DIAGNOSIS — I110 Hypertensive heart disease with heart failure: Secondary | ICD-10-CM | POA: Diagnosis not present

## 2021-06-23 DIAGNOSIS — E11620 Type 2 diabetes mellitus with diabetic dermatitis: Secondary | ICD-10-CM

## 2021-06-23 DIAGNOSIS — I5041 Acute combined systolic (congestive) and diastolic (congestive) heart failure: Secondary | ICD-10-CM

## 2021-06-23 DIAGNOSIS — I10 Essential (primary) hypertension: Secondary | ICD-10-CM

## 2021-06-23 DIAGNOSIS — E782 Mixed hyperlipidemia: Secondary | ICD-10-CM

## 2021-06-23 NOTE — Progress Notes (Addendum)
Virtual Visit via My Chart   This visit type was conducted due to national recommendations for restrictions regarding the COVID-19 Pandemic (e.g. social distancing) in an effort to limit this patient's exposure and mitigate transmission in our community.  Due to her co-morbid illnesses, this patient is at least at moderate risk for complications without adequate follow up.  This format is felt to be most appropriate for this patient at this time.  All issues noted in this document were discussed and addressed.  A limited physical exam was performed with this format.    This visit type was conducted due to national recommendations for restrictions regarding the COVID-19 Pandemic (e.g. social distancing) in an effort to limit this patient's exposure and mitigate transmission in our community.  Patients identity confirmed using two different identifiers.  This format is felt to be most appropriate for this patient at this time.  All issues noted in this document were discussed and addressed.  No physical exam was performed (except for noted visual exam findings with Video Visits).    Date:  07/13/2021   ID:  SHEKELA GOODRIDGE, DOB March 08, 1950, MRN [id number]  Patient Location:  Home - spoke with Laymond Purser  Provider location:   Office    Chief Complaint:  Hospital follow up  History of Present Illness:    BERDA SHELVIN is a 71 y.o. female who presents via video conferencing for a telehealth visit today.    The patient does not have symptoms concerning for COVID-19 infection (fever, chills, cough, or new shortness of breath).   Patient presents today for a hospital follow-up. She went due to having rapid heart beat and felt heavy to her legs. Her husband drove her to the hospital.  She also had some shortness of breath and speech was slurring.  With her MRI brain had an old stroke. She can tell that her speech is slurred has been going on for the last month. She was advised to get plenty of  rest and eat healthier. She did not need a stent placed. She is to follow up with Dr. Oval Linsey on May 4th at [facility]. She is to start diabetes class on May 9th.    She has started metformin and is tolerating well.   Hypertension This is a chronic problem. The current episode started more than 1 year ago. The problem is unchanged. The problem is controlled. Pertinent negatives include no anxiety, chest pain, headaches or palpitations. There are no associated agents to hypertension. Risk factors for coronary artery disease include sedentary lifestyle. Past treatments include nothing. Compliance problems include exercise and diet.  There is no history of chronic renal disease.    Past Medical History:  Diagnosis Date   CAD in native artery 06/06/2020   CAD native.  Calcium score 176, 85th percentile 04/2020   Cataracts, bilateral    right eye - just watching   Diverticulitis    GERD (gastroesophageal reflux disease) 2006   Hearing loss    right ear - no hearing aid   Heart attack ([place]) 2008   Hypertension    Irregular heart beat 1973   Palpitations 11/30/2019   SVD (spontaneous vaginal delivery)    x 3   Past Surgical History:  Procedure Laterality Date   APPENDECTOMY  1967   BREAST SURGERY     cyst removed from right breast at age 42   [place]  02/21/2018   Procedure: DILATATION & CURETTAGE/HYSTEROSCOPY WITH MYOSURE RESECTION OF ENDOMETRIAL POLYP;  Surgeon: Servando Salina, MD;  Location: [facility];  Service: Gynecology;  Laterality: N/A;   EXTERNAL EAR SURGERY  1974   TUBAL LIGATION  1977     Current Meds  Medication Sig   B COMPLEX-ZINC PO Take 1 tablet by mouth daily.   clobetasol cream (TEMOVATE) 0.05 % Apply to affected area twice as needed.   clotrimazole (GYNE-LOTRIMIN) 1 % vaginal cream Place 1 Applicatorful vaginally at bedtime.   diclofenac Sodium (VOLTAREN) 1 % GEL Apply 2 g topically 4  (four) times daily.   ibuprofen (ADVIL,MOTRIN) 200 MG tablet Take 400-600 mg by mouth every 6 (six) hours as needed for moderate pain.   metFORMIN (GLUCOPHAGE) 500 MG tablet Take 1 tablet (500 mg total) by mouth 2 (two) times daily with a meal.   nitroGLYCERIN (NITROSTAT) 0.4 MG SL tablet Place 1 tablet (0.4 mg total) under the tongue every 5 (five) minutes x 3 doses as needed for chest pain.   [DISCONTINUED] aspirin 81 MG tablet Take 81 mg by mouth every other day.   [DISCONTINUED] ezetimibe (ZETIA) 10 MG tablet Take 1 tablet (10 mg total) by mouth daily.   [DISCONTINUED] loratadine (CLARITIN) 10 MG tablet TAKE 1 TABLET BY MOUTH EVERY DAY (Patient taking differently: Take 10 mg by mouth every other day.)   [DISCONTINUED] metoprolol tartrate (LOPRESSOR) 25 MG tablet Take 1 tablet (25 mg total) by mouth 2 (two) times daily.   [DISCONTINUED] nystatin cream (MYCOSTATIN) Apply 1 application. topically 2 (two) times daily.     Allergies:   Lipitor [atorvastatin] and Sulfonamide derivatives   Social History   Tobacco Use   Smoking status: Former    Packs/day: 1.00    Years: 30.00    Pack years: 30.00    Types: Cigarettes    Quit date: 02/01/2011    Years since quitting: 10.4   Smokeless tobacco: Never  Vaping Use   Vaping Use: Never used  Substance Use Topics   Alcohol use: Not Currently    Comment: occasional glass of wine   Drug use: No    Comment: No IV drug     Family Hx: The patient's family history includes Asthma in her mother; COPD in her mother; Cancer in her maternal grandmother; Diabetes in her sister; Heart failure in her mother; Stroke in her mother. There is no history of Breast cancer.  ROS:   Please see the history of present illness.    Review of Systems  Constitutional: Negative.   Respiratory: Negative.    Cardiovascular: Negative.  Negative for chest pain and palpitations.  Gastrointestinal: Negative.   Neurological: Negative.  Negative for headaches.   Psychiatric/Behavioral: Negative.     All other systems reviewed and are negative.   Labs/Other Tests and Data Reviewed:    Recent Labs: 06/15/2021: B Natriuretic Peptide 163.8; TSH 1.538 06/16/2021: ALT 19; BUN 8; Creatinine, Ser 0.84; Hemoglobin 12.9; Magnesium 1.9; Platelets 226; Potassium 3.6; Sodium 140   Recent Lipid Panel Lab Results  Component Value Date/Time   CHOL 193 06/15/2021 05:17 PM   CHOL 201 (H) 05/22/2021 12:24 PM   TRIG 74 06/15/2021 05:17 PM   HDL 32 (L) 06/15/2021 05:17 PM   HDL 40 05/22/2021 12:24 PM   CHOLHDL 6.0 06/15/2021 05:17 PM   LDLCALC 146 (H) 06/15/2021 05:17 PM   LDLCALC 132 (H) 05/22/2021 12:24 PM   LDLDIRECT 138 (H) 07/02/2011 03:21 PM    Wt  Readings from Last 3 Encounters:  07/09/21 169 lb (76.7 kg)  07/03/21 171 lb (77.6 kg)  05/22/21 178 lb (80.7 kg)     Exam:    Vital Signs:  There were no vitals taken for this visit.    Physical Exam Vitals reviewed.  Constitutional:      General: She is not in acute distress.    Appearance: Normal appearance. She is obese.  Pulmonary:     Effort: Pulmonary effort is normal. No respiratory distress.     Breath sounds: No wheezing.  Neurological:     General: No focal deficit present.     Mental Status: She is alert and oriented to person, place, and time. Mental status is at baseline.     Cranial Nerves: No cranial nerve deficit.  Psychiatric:        Mood and Affect: Mood and affect normal.        Behavior: Behavior normal.        Thought Content: Thought content normal.        Cognition and Memory: Memory normal.        Judgment: Judgment normal.    ASSESSMENT & PLAN:    1. Hypertensive heart disease with chronic combined systolic and diastolic congestive heart failure TCM Performed. A member of the clinical team spoke with the patient upon dischare. Discharge summary was reviewed in full detail during the visit. Meds reconciled and compared to discharge meds. Medication list is updated  and reviewed with the patient.  Greater than 50% face to face time was spent in counseling an coordination of care.  All questions were answered to the satisfaction of the patient.  Continue current medications. She is to follow up with Dr. Oval Linsey in May.   2. Type 2 diabetes mellitus with diabetic dermatitis, without long-term current use of insulin (HCC) Stable, continue current medications  3. Mixed hyperlipidemia Stable, continue current medications    COVID-19 Education: The signs and symptoms of COVID-19 were discussed with the patient and how to seek care for testing (follow up with PCP or arrange E-visit).  The importance of social distancing was discussed today.  Patient Risk:   After full review of this patients clinical status, I feel that they are at least moderate risk at this time.  Time:   Today, I have spent 12 minutes/ seconds with the patient with telehealth technology discussing above diagnoses.     Medication Adjustments/Labs and Tests Ordered: Current medicines are reviewed at length with the patient today.  Concerns regarding medicines are outlined above.   Tests Ordered: No orders of the defined types were placed in this encounter.   Medication Changes: No orders of the defined types were placed in this encounter.   Disposition:  Follow up prn  Signed, [name], FNP

## 2021-06-23 NOTE — Patient Instructions (Signed)
Heart Failure and Exercise ?Heart failure is a condition in which the heart does not fill or pump enough blood and oxygen to support your body and its functions. Heart failure is a long-term (chronic) condition. Living with heart failure can be challenging. However, following your health care provider's instructions about a healthy lifestyle may help improve your symptoms. This includes choosing the right exercise plan. ?Doing daily physical activity is important after a diagnosis of heart failure. You may have some activity restrictions, so talk to your health care provider before doing any exercises. ?What are the benefits of exercise? ?Exercise may: ?Make your heart muscles stronger. ?Lower your blood pressure and cholesterol. ?Help you lose weight. ?Help your bones stay strong. ?Improve your blood circulation. ?Help your body use oxygen better. This relieves symptoms such as fatigue and shortness of breath. ?Help your mental health by lowering the risk of depression and other problems. ?Improve your quality of life. ?Decrease your chance of hospital admission for heart failure. ?What is an exercise plan? ?An exercise plan is a set of specific exercises and training activities. You will work with your health care provider to create the exercise plan that works for you. The plan may include: ?Different types of exercises and how to do them. ?Cardiac rehabilitation exercises. These are supervised programs that are designed to strengthen your heart. ?What are strengthening exercises? ?Strengthening exercises are a type of physical activity that involves using resistance to improve your muscle strength. Strengthening exercises usually have repetitive motions. These types of exercises can include: ?Lifting weights. ?Using weight machines. ?Using resistance tubes and bands. ?Using kettlebells. ?Using your body weight, such as doing push-ups or squats. ?What are balance exercises? ?Balance exercises are another type of  physical activity. They strengthen the muscles of the back, abdomen, and pelvis (core muscles) and improve your balance. They can also lower your risk of falling. These types of exercises can include: ?Standing on one leg. ?Walking backward, sideways, and in a straight line. ?Standing up after sitting, without using your hands. ?Shifting your weight from one leg to the other. ?Lifting one leg in front of you. ?Doing tai chi. This is a type of exercise that uses slow movements and deep breathing. ?How can I increase my flexibility? ?Having better flexibility can keep you from falling. It can also lengthen your muscles, improve your range of motion, and help your joints. You can increase your flexibility by: ?Doing tai chi. ?Doing yoga. ?Stretching. ?How much aerobic exercise should I get? ? ?Aerobic exercise strengthens your breathing and circulation system and increases your body's use of oxygen. This type of exercise causes your heart to beat faster while you are doing it. Examples of aerobic exercise include biking, walking, running, and swimming. Talk to your health care provider to find out how much aerobic exercise is safe for you. ?To do this type of exercise: ?Start exercising slowly, limiting the amount of time at first. You may need to start with 5 minutes of aerobic exercise every day. ?Slowly add more minutes until you can safely do at least 30 minutes of exercise at least 5 days a week. ?Summary ?Daily physical activity is important after a diagnosis of heart failure. ?Exercise can make your heart muscles stronger. It also offers other benefits that will improve your health. ?Exercise can decrease your chance of hospital admission for heart failure. ?Talk to your health care provider before doing any exercises. ?This information is not intended to replace advice given to you by your  health care provider. Make sure you discuss any questions you have with your health care provider. ?Document Revised:  10/02/2019 Document Reviewed: 10/02/2019 ?Elsevier Patient Education ? [place]. ? ?

## 2021-06-29 DIAGNOSIS — E782 Mixed hyperlipidemia: Secondary | ICD-10-CM

## 2021-06-29 DIAGNOSIS — I10 Essential (primary) hypertension: Secondary | ICD-10-CM

## 2021-07-02 ENCOUNTER — Ambulatory Visit (INDEPENDENT_AMBULATORY_CARE_PROVIDER_SITE_OTHER): Payer: Medicare HMO

## 2021-07-02 ENCOUNTER — Telehealth: Payer: Medicare HMO

## 2021-07-02 DIAGNOSIS — E11620 Type 2 diabetes mellitus with diabetic dermatitis: Secondary | ICD-10-CM

## 2021-07-02 DIAGNOSIS — I10 Essential (primary) hypertension: Secondary | ICD-10-CM

## 2021-07-02 DIAGNOSIS — E782 Mixed hyperlipidemia: Secondary | ICD-10-CM

## 2021-07-02 DIAGNOSIS — K219 Gastro-esophageal reflux disease without esophagitis: Secondary | ICD-10-CM

## 2021-07-02 NOTE — Patient Instructions (Signed)
Visit Information ? ?Thank you for taking time to visit with me today. Please don't hesitate to contact me if I can be of assistance to you before our next scheduled telephone appointment. ? ?Following are the goals we discussed today:  ?(Copy and paste patient goals from clinical care plan here) ? ?Our next appointment is by telephone on 07/31/21 at 11:15 AM ? ?Please call the care guide team at [phone] if you need to cancel or reschedule your appointment.  ? ?If you are experiencing a Mental Health or [place] or need someone to talk to, please call 1-800-273-TALK (toll free, 24 hour hotline)  ? ?Patient verbalizes understanding of instructions and care plan provided today and agrees to view in [facility]. Active MyChart status confirmed with patient.   ? ?Barb Merino, RN, BSN, CCM ?Care Management Coordinator ?[facility]  ?Direct Phone: [phone] ? ? ?

## 2021-07-02 NOTE — Chronic Care Management (AMB) (Signed)
?Chronic Care Management  ? ?CCM RN Visit Note ? ?07/02/2021 ?Name: Wendy Mosley MRN: [id number] DOB: Nov 03, 1950 ? ?Subjective: ?Wendy Mosley is a 71 y.o. year old female who is a primary care patient of [name], [facility]. The care management team was consulted for assistance with disease management and care coordination needs.   ? ?Engaged with patient by telephone for follow up visit in response to provider referral for case management and/or care coordination services.  ? ?Consent to Services:  ?The patient was given information about Chronic Care Management services, agreed to services, and gave verbal consent prior to initiation of services.  Please see initial visit note for detailed documentation.  ? ?Patient agreed to services and verbal consent obtained.  ? ?Assessment: Review of patient past medical history, allergies, medications, health status, including review of consultants reports, laboratory and other test data, was performed as part of comprehensive evaluation and provision of chronic care management services.  ? ?SDOH (Social Determinants of Health) assessments and interventions performed:  Yes, no acute needs  ? ?CCM Care Plan ? ?Allergies  ?Allergen Reactions  ? Lipitor [Atorvastatin] Other (See Comments)  ?  Hair loss  ? Sulfonamide Derivatives   ?  Rash/itching   ? ? ?Outpatient Encounter Medications as of 07/02/2021  ?Medication Sig  ? aspirin 81 MG tablet Take 81 mg by mouth every other day.  ? B COMPLEX-ZINC PO Take 1 tablet by mouth daily.  ? clobetasol cream (TEMOVATE) 0.05 % Apply to affected area twice as needed.  ? clotrimazole (GYNE-LOTRIMIN) 1 % vaginal cream Place 1 Applicatorful vaginally at bedtime.  ? diclofenac Sodium (VOLTAREN) 1 % GEL Apply 2 g topically 4 (four) times daily.  ? ezetimibe (ZETIA) 10 MG tablet Take 1 tablet (10 mg total) by mouth daily.  ? ibuprofen (ADVIL,MOTRIN) 200 MG tablet Take 400-600 mg by mouth every 6 (six) hours as needed for moderate pain.  ?  loratadine (CLARITIN) 10 MG tablet TAKE 1 TABLET BY MOUTH EVERY DAY (Patient taking differently: Take 10 mg by mouth every other day.)  ? metFORMIN (GLUCOPHAGE) 500 MG tablet Take 1 tablet (500 mg total) by mouth 2 (two) times daily with a meal.  ? metoprolol tartrate (LOPRESSOR) 25 MG tablet Take 1 tablet (25 mg total) by mouth 2 (two) times daily.  ? nitroGLYCERIN (NITROSTAT) 0.4 MG SL tablet Place 1 tablet (0.4 mg total) under the tongue every 5 (five) minutes x 3 doses as needed for chest pain.  ? nystatin cream (MYCOSTATIN) Apply 1 application. topically 2 (two) times daily.  ? ?No facility-administered encounter medications on file as of 07/02/2021.  ? ? ?Patient Active Problem List  ? Diagnosis Date Noted  ? Systolic and diastolic CHF, acute ([place]) 06/17/2021  ? Chest pain 06/15/2021  ? SVT (supraventricular tachycardia) ([place]) 06/15/2021  ? Benign essential HTN 06/15/2021  ? HLD (hyperlipidemia) 06/15/2021  ? Tobacco use 06/15/2021  ? Controlled type 2 diabetes mellitus without complication, without long-term current use of insulin ([place]) 06/15/2021  ? CAD in native artery 06/06/2020  ? Chronic right shoulder pain 05/15/2020  ? Palpitations 11/30/2019  ? Constipation 09/15/2018  ? Colonic polyp 12/24/2016  ? Hyperlipidemia 11/14/2015  ? HTN (hypertension) 11/12/2015  ? Arthritis of right knee 11/12/2015  ? Dermatitis 11/12/2015  ? Allergic rhinitis 07/27/2012  ? Lichen sclerosus et atrophicus of the vulva 07/12/2011  ? GERD (gastroesophageal reflux disease) 07/02/2011  ? Postmenopausal atrophic vaginitis 07/02/2011  ? ? ?Conditions to be addressed/monitored: HTN,  HLD, GERD, Type 2 diabetes mellitus ? ?Care Plan : RN Care Manager Plan of Care  ?Updates made by Lynne Logan, RN since 07/02/2021 12:00 AM  ?  ? ?Problem: No plan of care established for management of chronic disease states (HTN, HLD, GERD)   ?Priority: High  ?  ? ?Long-Range Goal: Development of plan of care for chronic disease management for HTN,  HLD, GERD   ?Start Date: 12/27/2020  ?Expected End Date: 12/27/2021  ?Recent Progress: On track  ?Priority: High  ?Note:   ?Current Barriers:  ?Knowledge Deficits related to plan of care for management of HTN, HLD, GERD ?Chronic Disease Management support and education needs related to HTN, HLD, GERD ? ?RNCM Clinical Goal(s):  ?Patient will verbalize basic understanding of  HTN, HLD, GERD disease process and self health management plan   ?take all medications exactly as prescribed and will call provider for medication related questions ?attend all scheduled medical appointments: reschedule PCP physical ?demonstrate Improved health management independence   ?continue to work with RN Care Manager to address care management and care coordination needs related to  HTN, HLD, GERD ?will demonstrate ongoing self health care management ability    through collaboration with RN Care manager, provider, and care team.  ? ?Interventions: ?1:1 collaboration with primary care provider regarding development and update of comprehensive plan of care as evidenced by provider attestation and co-signature ?Inter-disciplinary care team collaboration (see longitudinal plan of care) ?Evaluation of current treatment plan related to  self management and patient's adherence to plan as established by provider ? ?Hyperlipidemia Interventions:  (Status:  Goal on track:  Yes.) Long Term Goal ?Evaluation of current treatment plan related to Hyperlipidemia, self-management and patient's adherence to plan as established by provider ?Reviewed and discussed recent Assessment/Plan following a recent IP admission with the following dx:  ? ?SVT (supraventricular tachycardia) ([place])  ?Hypotension, unspecified hypotension type  ?Chest pain, unspecified type  ?Reviewed importance of limiting foods high in cholesterol ?Reviewed exercise goals and target of 150 minutes per week ?Mailed printed educational materials related to Hyperlipidemia  management ?Reviewed scheduled/upcoming provider appointment including: Cardiology follow up scheduled for 07/03/21 '@8'$ :00 AM  ?Discussed plans with patient for ongoing care management follow up and provided patient with direct contact information for care management team ?Lipid Panel  ?   ?Component Value Date/Time  ? CHOL 193 06/15/2021 1717  ? CHOL 201 (H) 05/22/2021 1224  ? TRIG 74 06/15/2021 1717  ? HDL 32 (L) 06/15/2021 1717  ? HDL 40 05/22/2021 1224  ? CHOLHDL 6.0 06/15/2021 1717  ? VLDL 15 06/15/2021 1717  ? [place] 146 (H) 06/15/2021 1717  ? LDLCALC 132 (H) 05/22/2021 1224  ? LDLDIRECT 138 (H) 07/02/2011 1521  ? LABVLDL 29 05/22/2021 1224  ?  ? ?Diabetes Interventions:  (Status:  New goal.) Long Term Goal ?Assessed patient's understanding of A1c goal:  <5.7% ?Provided education to patient about basic DM disease process ?Reviewed medications with patient and discussed importance of medication adherence ?Counseled on importance of regular laboratory monitoring as prescribed ?Review of patient status, including review of consultants reports, relevant laboratory and other test results, and medications completed ?Educated patient on dietary and exercise recommendations ?Mailed printed educational materials related diabetes meal planning, carb choice list, preventing complications from diabetes ?Lab Results  ?Component Value Date  ? HGBA1C 6.4 (H) 06/15/2021  ? ?Cataract Removal Interventions:  (Status:  Condition stable.  Not addressed this visit.)  Short Term Goal ?Evaluation of current treatment plan  related to  Cataract Removal , self-management and patient's adherence to plan as established by provider ?Determined patient completed her Cataract surgery with good results, although she is experiencing iritis  ?Determined patient is adhering to prescribed Prednisolone eye drops as directed with good effectiveness  ?Discussed the importance of keeping Dr. Katy Fitch well informed of changes to her eye or visual acuity  and to keep all MD follow up appointments  ?Discussed plans with patient for ongoing care management follow up and provided patient with direct contact information for care management team ? ?GERD Interventions:  (Status:  Conditi

## 2021-07-03 ENCOUNTER — Encounter (HOSPITAL_BASED_OUTPATIENT_CLINIC_OR_DEPARTMENT_OTHER): Payer: Self-pay | Admitting: Family

## 2021-07-03 ENCOUNTER — Ambulatory Visit (HOSPITAL_BASED_OUTPATIENT_CLINIC_OR_DEPARTMENT_OTHER): Payer: Medicare HMO | Admitting: Family

## 2021-07-03 VITALS — BP 150/80 | HR 76 | Ht 64.0 in | Wt 171.0 lb

## 2021-07-03 DIAGNOSIS — E785 Hyperlipidemia, unspecified: Secondary | ICD-10-CM

## 2021-07-03 DIAGNOSIS — I10 Essential (primary) hypertension: Secondary | ICD-10-CM

## 2021-07-03 DIAGNOSIS — I25118 Atherosclerotic heart disease of native coronary artery with other forms of angina pectoris: Secondary | ICD-10-CM

## 2021-07-03 DIAGNOSIS — G4730 Sleep apnea, unspecified: Secondary | ICD-10-CM | POA: Diagnosis not present

## 2021-07-03 DIAGNOSIS — E1165 Type 2 diabetes mellitus with hyperglycemia: Secondary | ICD-10-CM

## 2021-07-03 DIAGNOSIS — I471 Supraventricular tachycardia: Secondary | ICD-10-CM | POA: Diagnosis not present

## 2021-07-03 DIAGNOSIS — I5020 Unspecified systolic (congestive) heart failure: Secondary | ICD-10-CM | POA: Diagnosis not present

## 2021-07-03 DIAGNOSIS — R0683 Snoring: Secondary | ICD-10-CM

## 2021-07-03 MED ORDER — METOPROLOL TARTRATE 25 MG PO TABS: 25.0000 mg | ORAL_TABLET | Freq: Two times a day (BID) | ORAL | 3 refills | Status: DC

## 2021-07-03 MED ORDER — ASPIRIN EC 81 MG PO TBEC: 81.0000 mg | DELAYED_RELEASE_TABLET | Freq: Every day | ORAL | 3 refills | Status: AC

## 2021-07-03 MED ORDER — EZETIMIBE 10 MG PO TABS
10.0000 mg | ORAL_TABLET | Freq: Every day | ORAL | 3 refills | Status: DC
Start: 2021-07-03 — End: 2022-07-20

## 2021-07-03 NOTE — Patient Instructions (Signed)
Medication Instructions:  ?Your physician has recommended you make the following change in your medication:  ? ?Change: Aspirin '81mg'$  daily  ? ?Start: Losartan '25mg'$  daily  ? ?We sent in refills of your Losartan, Metoprolol and Zetia.  ? ?*If you need a refill on your cardiac medications before your next appointment, please call your pharmacy* ? ? ?Lab Work: ?Please return for Lab work in 1 month for Fasting Lipid Panel and CMET . You may come to the...  ? ?[facility] (3rd floor) ?[address], [place], [place]  ?Open: 8am-Noon and 1pm-4:30pm  ? ?[place] [facility] ?[place]  ? ?[employer]- Any location ? ?**no appointments needed** ? ?If you have labs (blood work) drawn today and your tests are completely normal, you will receive your results only by: ?MyChart Message (if you have MyChart) OR ?A paper copy in the mail ?If you have any lab test that is abnormal or we need to change your treatment, we will call you to review the results. ? ? ?Testing/Procedures: ?Your physician has recommended that you have a sleep study. This test records several body functions during sleep, including: brain activity, eye movement, oxygen and carbon dioxide blood levels, heart rate and rhythm, breathing rate and rhythm, the flow of air through your mouth and nose, snoring, body muscle movements, and chest and belly movement. ? ? ? ?Follow-Up: ?At [facility], you and your health needs are our priority.  As part of our continuing mission to provide you with exceptional heart care, we have created designated Provider Care Teams.  These Care Teams include your primary Cardiologist (physician) and Advanced Practice Providers (APPs -  Physician Assistants and Nurse Practitioners) who all work together to provide you with the care you need, when you need it. ? ?We recommend signing up for the patient portal called "MyChart".  Sign up information is provided on this After  Visit Summary.  MyChart is used to connect with patients for Virtual Visits (Telemedicine).  Patients are able to view lab/test results, encounter notes, upcoming appointments, etc.  Non-urgent messages can be sent to your provider as well.   ?To learn more about what you can do with MyChart, go to [email or web address].   ? ?Your next appointment:   ?2 month(s) ? ?The format for your next appointment:   ?In Person ? ?Provider:   ?Skeet Latch, MD or Laurann Montana, NP{ ? ?Other Instructions ?Heart Healthy Diet Recommendations: ?A low-salt diet is recommended. Meats should be grilled, baked, or boiled. Avoid fried foods. Focus on lean protein sources like fish or chicken with vegetables and fruits. The American Heart Association is a Microbiologist!  American Heart Association Diet and Lifeystyle Recommendations  ? ?Exercise recommendations: ?The American Heart Association recommends 150 minutes of moderate intensity exercise weekly. ?Try 30 minutes of moderate intensity exercise 4-5 times per week. ?This could include walking, jogging, or swimming. ? ? ?Important Information About Sugar ? ? ? ? ? ? ?

## 2021-07-03 NOTE — Progress Notes (Signed)
? ?Office Visit  ?  ?Patient Name: Wendy Mosley ?Date of Encounter: 07/03/2021 ? ?PCP:  [name], FNP ?  ?[place]  ?Cardiologist:  Skeet Latch, MD  ?Advanced Practice Provider:  No care team member to display ?Electrophysiologist:  None  ?   ?Chief Complaint  ?  ?Wendy Mosley is a 71 y.o. female with a hx of mild nonobstructive coronary disease, SVT, prediabetes, hypertension, hyperlipidemia, prior tobacco use, palpitations presents today for ED follow up  ? ?Past Medical History  ?  ?Past Medical History:  ?Diagnosis Date  ? CAD in native artery 06/06/2020  ? CAD native.  Calcium score 176, 85th percentile 04/2020  ? Cataracts, bilateral   ? right eye - just watching  ? Diverticulitis   ? GERD (gastroesophageal reflux disease) 2006  ? Hearing loss   ? right ear - no hearing aid  ? Heart attack [facility]) 2008  ? Hypertension   ? Irregular heart beat 1973  ? Palpitations 11/30/2019  ? SVD (spontaneous vaginal delivery)   ? x 3  ? ?Past Surgical History:  ?Procedure Laterality Date  ? APPENDECTOMY  1967  ? BREAST SURGERY    ? cyst removed from right breast at age 72  ? CHOLECYSTECTOMY  1975  ? COLONOSCOPY    ? DILATATION & CURETTAGE/HYSTEROSCOPY WITH MYOSURE N/A 02/21/2018  ? Procedure: DILATATION & CURETTAGE/HYSTEROSCOPY WITH MYOSURE RESECTION OF ENDOMETRIAL POLYP;  Surgeon: Servando Salina, MD;  Location: [facility];  Service: Gynecology;  Laterality: N/A;  ? EXTERNAL EAR SURGERY  1974  ? TUBAL LIGATION  1977  ? ? ?Allergies ? ?Allergies  ?Allergen Reactions  ? Lipitor [Atorvastatin] Other (See Comments)  ?  Hair loss  ? Sulfonamide Derivatives   ?  Rash/itching   ? ? ?History of Present Illness  ?  ?Wendy Mosley is a 71 y.o. female with a hx of mild nonobstructive coronary disease, SVT, prediabetes, hypertension, hyperlipidemia, prior tobacco use, palpitations last seen 06/06/20. ? ?Established with Dr. Tommie Raymond September 2021 due to palpitations.  EKG revealed sinus rhythm with no  arrhythmia.  Thyroid function, CMP, CBC unremarkable.  Her blood pressure and cholesterol are noted to be poorly controlled but she was hesitant regarding medications.  Coronary calcium score 176 placing her in the 85th percentile 04/2020.  When last seen 06/06/2020 she noted understanding of play grieving the loss of her daughter January 2022 from cancer.  She was recommended to continue atorvastatin for secondary prevention of coronary disease. ? ?She was seen in the ED 06/15/2021 after presenting with generalized weakness and chest pain.  CT head no acute findings.  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  There is also periventricular and subcortical T2 hyperintensities bilaterally likely reflect sequela of chronic microvascular ischemia.  Echo 06/16/21 EF 16-07%, grade 1 diastolic dysfunction, no significant valvular abnormalities.  Cardiac CTA 06/16/2021 with coronary calcium score to 38 placing her in the 87th percentile for age and sex matched control with mild nonobstructive coronary disease.  Due to hair loss on atorvastatin and was discharged on rosuvastatin. ? ?She presents today for follow-up.  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  We reviewed hospitalization and testing in detail. Since discharge has started a walking regimen in her neighborhood. Eats out and at home and we reviewed low-sodium, healthy diet.  Checking BP intermittently at home as earlier this week is 175 or 80.  Discussed additional need for antihypertensive therapy and BP goal less than 130/80.  Discussed LDL goal less than 70-she shares with me that "all statins about me "but is tolerating Zetia.  Previously on atorvastatin but no previous statin noted by my review.  She is agreeable to discuss with our pharmacist.  She does note that she snores and her husband has witnessed her stop breathing.  She is interested in sleep study. ? ?Reports no shortness of  breath nor dyspnea on exertion. Reports no chest pain, pressure, or tightness. No edema, orthopnea, PND. Reports no palpitations.   ? ?EKGs/Labs/Other Studies Reviewed:  ? ?The following studies were reviewed today: ? ?Coronary calcium score 04/18/2020 ?IMPRESSION: ?Coronary calcium score of 176. This was 85th percentile for age and ?sex matched control. ?  ?Recommend aggressive risk factor modification, including LDL goal ?<70. ?  ?Echo 06/16/2021 ? ? 1. Left ventricular ejection fraction, by estimation, is 45 to 50%. The  ?left ventricle has mildly decreased function. The left ventricle  ?demonstrates global hypokinesis. Left ventricular diastolic parameters are  ?consistent with Grade I diastolic  ?dysfunction (impaired relaxation).  ? 2. Right ventricular systolic function is normal. The right ventricular  ?size is normal. Tricuspid regurgitation signal is inadequate for assessing  ?PA pressure.  ? 3. The mitral valve is normal in structure. Trivial mitral valve  ?regurgitation. No evidence of mitral stenosis.  ? 4. The aortic valve is tricuspid. Aortic valve regurgitation is not  ?visualized. No aortic stenosis is present.  ? 5. The inferior vena cava is normal in size with greater than 50%  ?respiratory variability, suggesting right atrial pressure of 3 mmHg.  ? ?Coronary CTA 06/16/2021 ?FINDINGS: ?Coronary calcium score: The patient's coronary artery calcium score ?is 238, which places the patient in the 87 percentile. ?  ?Coronary arteries: Normal coronary origins.  Left dominance. ?  ?Right Coronary Artery: Small caliber vessel. Trivial mixed calcified ?and noncalcified plaque with 1-24% stenosis maximum. ?  ?Left Main Coronary Artery: Normal caliber vessel. Mixed calcified ?and noncalcified plaque with 1-24% stenosis. ?  ?Left Anterior Descending Coronary Artery: Normal caliber vessel. ?Mixed calcified and noncalcified plaque in proximal LAD and portions ?of mid LAD. Maximum 1-24% stenosis. Gives rise to one  large diagonal ?branch. ?  ?Left Circumflex Artery: Normal caliber vessel, gives rise to PDA. ?Mixed calcified and noncalcified plaque in proximal portion, with a ?focal calcified plaque at the origin of OM with 25-49% stenosis. ?Gives rise to one large OM branch. ?  ?Aorta: Normal size, 28 mm at the mid ascending aorta (level of the ?PA bifurcation) measured double oblique. Aortic atherosclerosis, ?with likely mural thrombus in abdominal aorta. No dissection seen in ?visualized portions of the aorta. ?  ?Aortic Valve: No calcifications. Trileaflet. ?  ?Other findings: ?  ?Normal pulmonary vein drainage into the left atrium. ?  ?Normal left atrial appendage without a thrombus. ?  ?Normal size of the pulmonary artery. ?  ?Normal appearance of the pericardium. ?  ?IMPRESSION: ?1. Minimal nonobstructive CAD, CADRADS = 2. ?  ?2. Coronary calcium score of 238. This was 87th percentile for age ?and sex matched control. ?  ?3. Normal coronary origin with left dominance. ?  ?4. Aortic atherosclerosis, with likely mural thrombus in abdominal ?aorta ?  ?EKG: No EKG today ? ?Recent Labs: ?06/15/2021: B Natriuretic Peptide 163.8; TSH 1.538 ?06/16/2021: ALT 19; BUN 8; Creatinine, Ser 0.84; Hemoglobin 12.9; Magnesium 1.9; Platelets 226; Potassium 3.6; Sodium 140  ?Recent Lipid Panel ?   ?  Component Value Date/Time  ? CHOL 193 06/15/2021 1717  ? CHOL 201 (H) 05/22/2021 1224  ? TRIG 74 06/15/2021 1717  ? HDL 32 (L) 06/15/2021 1717  ? HDL 40 05/22/2021 1224  ? CHOLHDL 6.0 06/15/2021 1717  ? VLDL 15 06/15/2021 1717  ? [place] 146 (H) 06/15/2021 1717  ? LDLCALC 132 (H) 05/22/2021 1224  ? LDLDIRECT 138 (H) 07/02/2011 1521  ? ? ?Home Medications  ? ?Current Meds  ?Medication Sig  ? aspirin EC 81 MG tablet Take 1 tablet (81 mg total) by mouth daily. Swallow whole.  ? B COMPLEX-ZINC PO Take 1 tablet by mouth daily.  ? clobetasol cream (TEMOVATE) 0.05 % Apply to affected area twice as needed.  ? clotrimazole (GYNE-LOTRIMIN) 1 % vaginal cream  Place 1 Applicatorful vaginally at bedtime.  ? diclofenac Sodium (VOLTAREN) 1 % GEL Apply 2 g topically 4 (four) times daily.  ? ibuprofen (ADVIL,MOTRIN) 200 MG tablet Take 400-600 mg by mouth every 6 (s

## 2021-07-04 ENCOUNTER — Telehealth (HOSPITAL_BASED_OUTPATIENT_CLINIC_OR_DEPARTMENT_OTHER): Payer: Self-pay

## 2021-07-04 ENCOUNTER — Encounter (INDEPENDENT_AMBULATORY_CARE_PROVIDER_SITE_OTHER): Payer: Medicare HMO | Admitting: Cardiology

## 2021-07-04 DIAGNOSIS — G4733 Obstructive sleep apnea (adult) (pediatric): Secondary | ICD-10-CM | POA: Diagnosis not present

## 2021-07-04 NOTE — Telephone Encounter (Signed)
Left message with sleep study approval and pin number  ?

## 2021-07-08 ENCOUNTER — Encounter: Payer: Medicare HMO | Attending: Nurse Practitioner | Admitting: Skilled Nursing Facility1

## 2021-07-08 ENCOUNTER — Ambulatory Visit: Payer: Medicare HMO

## 2021-07-08 DIAGNOSIS — E119 Type 2 diabetes mellitus without complications: Secondary | ICD-10-CM

## 2021-07-08 DIAGNOSIS — R0683 Snoring: Secondary | ICD-10-CM

## 2021-07-08 DIAGNOSIS — Z713 Dietary counseling and surveillance: Secondary | ICD-10-CM | POA: Diagnosis not present

## 2021-07-08 DIAGNOSIS — E11620 Type 2 diabetes mellitus with diabetic dermatitis: Secondary | ICD-10-CM | POA: Diagnosis not present

## 2021-07-08 NOTE — Procedures (Signed)
? ?  SLEEP STUDY REPORT ?Patient Information ?Study Date: 07/04/21 ?Patient Name: Wendy Mosley ?Patient ID: Ulah Kahl ?Birth Date: 22-Jan-2051 ?Age: 71 ?Gender: Female ?BMI: 29.4 (W=172 lb, H=5' 4'') ?Referring Physician: Laurann Montana, NP ? ?TEST DESCRIPTION: Home sleep apnea testing was completed using the WatchPat, a Type 1 device, utilizing  ?peripheral arterial tonometry (PAT), chest movement, actigraphy, pulse oximetry, pulse rate, body position and snore.  ?AHI was calculated with apnea and hypopnea using valid sleep time as the denominator. RDI includes apneas,  ?hypopneas, and RERAs. The data acquired and the scoring of sleep and all associated events were performed in  ?accordance with the recommended standards and specifications as outlined in the AASM Manual for the Scoring of  ?Sleep and Associated Events 2.2.0 (2015). ? ?FINDINGS: ?1. Severe Obstructive Sleep Apnea with AHI 29.5/hr.  ?2. No Central Sleep Apnea with pAHIc 0.4/hr. ?3. Oxygen desaturations as low as 79%. ?4. Severe snoring was present. O2 sats were < 88% for 103.4 min. ?5. Total sleep time was 5 hrs and 13 min. ?6. 28.2% of total sleep time was spent in REM sleep.  ?7. Prolonged sleep onset latency at 30 min.  ?8. Prolonged onset to REM sleep latency at 170mn ?9. Total awakenings were 11.  ? ?DIAGNOSIS:  ?Severe Obstructive Sleep Apnea (G47.33) ?Nocturnal Hypoxemia ? ?RECOMMENDATIONS: ?1. Clinical correlation of these findings is necessary. The decision to treat obstructive sleep apnea (OSA) is usually  ?based on the presence of apnea symptoms or the presence of associated medical conditions such as Hypertension,  ?Congestive Heart Failure, Atrial Fibrillation or Obesity. The most common symptoms of OSA are snoring, gasping for  ?breath while sleeping, daytime sleepiness and fatigue.  ? ?2. Initiating apnea therapy is recommended given the presence of symptoms and/or associated conditions.  ?Recommend proceeding with one of the  following: ? ? a. Auto-CPAP therapy with a pressure range of 5-20cm H2O. ? ? b. An oral appliance (OA) that can be obtained from certain dentists with expertise in sleep medicine. These are  ?primarily of use in non-obese patients with mild and moderate disease. ? ? c. An ENT consultation which may be useful to look for specific causes of obstruction and possible treatment  ?options. ? ? d. If patient is intolerant to PAP therapy, consider referral to ENT for evaluation for hypoglossal nerve stimulator.  ? ?3. Close follow-up is necessary to ensure success with CPAP or oral appliance therapy for maximum benefit . ? ?4. A follow-up oximetry study on CPAP is recommended to assess the adequacy of therapy and determine the need  ?for supplemental oxygen or the potential need for Bi-level therapy. An arterial blood gas to determine the adequacy of  ?baseline ventilation and oxygenation should also be considered. ? ?5. Healthy sleep recommendations include: adequate nightly sleep (normal 7-9 hrs/night), avoidance of caffeine after  ?noon and alcohol near bedtime, and maintaining a sleep environment that is cool, dark and quiet. ? ?6. Weight loss for overweight patients is recommended. Even modest amounts of weight loss can significantly  ?improve the severity of sleep apnea. ? ?7. Snoring recommendations include: weight loss where appropriate, side sleeping, and avoidance of alcohol before  ?bed. ? ?8. Operation of motor vehicle should be avoided when sleepy. ? ?Signature: ?Electronically Signed: 07/08/21 ?TFransico Him MD; F[facility] DRuna[place] ATerminousBoard of  Sleep Medicine ? ?

## 2021-07-09 ENCOUNTER — Encounter: Payer: Self-pay | Admitting: Skilled Nursing Facility1

## 2021-07-09 NOTE — Progress Notes (Signed)
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  ? ?Patient was seen on 07/08/2021 for the first of a series of three diabetes self-management courses at [facility]. ? ?Patient Education Plan per assessed needs and concerns is to attend three course education program for Diabetes Self Management Education. ? ?A1C was 6.4 on 06/15/2021 from 6.7 03/23 ? ?The following learning objectives were met by the patient during this class: ?Describe diabetes, types of diabetes and pathophysiology ?State some common risk factors for diabetes ?Defines the role of glucose and insulin ?Describe the relationship between diabetes and cardiovascular and other risks ?State the members of the Healthcare Team ?States the rationale for glucose monitoring and when to test ?State their individual Target Range ?State the importance of logging glucose readings and how to interpret the readings ?Identifies A1C target ?Explain the correlation between A1c and eAG values ?State symptoms and treatment of high blood glucose and low blood glucose ?Explain proper technique for glucose testing and identify proper sharps disposal ? ?Handouts given during class include: ?How to Thrive:  A Guide for Your Journey with Diabetes by the ADA ?Meal Plan Card and carbohydrate content list ?Dietary intake form ?Low Sodium Flavoring Tips ?Types of Fats ?Dining Out ?Label reading ?Snack list ?The diabetes portion plate ?Diabetes Resources ?A1c to eAG Conversion Chart ?Blood Glucose Log ?Diabetes Recommended Care Schedule ?Support Group ?Diabetes Success Plan ?Core Class Satisfaction Survey ? ? ?Follow-Up Plan: ?Attend core 2  ? ?

## 2021-07-15 ENCOUNTER — Encounter: Payer: Self-pay | Admitting: Dietician

## 2021-07-15 ENCOUNTER — Encounter: Payer: Medicare HMO | Admitting: Dietician

## 2021-07-15 DIAGNOSIS — E119 Type 2 diabetes mellitus without complications: Secondary | ICD-10-CM

## 2021-07-15 DIAGNOSIS — Z713 Dietary counseling and surveillance: Secondary | ICD-10-CM | POA: Diagnosis not present

## 2021-07-15 DIAGNOSIS — E11620 Type 2 diabetes mellitus with diabetic dermatitis: Secondary | ICD-10-CM | POA: Diagnosis not present

## 2021-07-15 NOTE — Progress Notes (Signed)
Patient was seen on 07/15/2021 for the second of a series of three diabetes self-management courses at the [facility]. The following learning objectives were met by the patient during this class: ? ?Describe the role of different macronutrients on glucose ?Explain how carbohydrates affect blood glucose ?State what foods contain the most carbohydrates ?Demonstrate carbohydrate counting ?Demonstrate how to read Nutrition Facts food label ?Describe effects of various fats on heart health ?Describe the importance of good nutrition for health and healthy eating strategies ?Describe techniques for managing your shopping, cooking and meal planning ?List strategies to follow meal plan when dining out ?Describe the effects of alcohol on glucose and how to use it safely ? ?Goals:  ?Follow Diabetes Meal Plan as instructed  ?Aim to spread carbs evenly throughout the day  ?Aim for 3 meals per day and snacks as needed ?Include lean protein foods to meals/snacks  ?Monitor glucose levels as instructed by your doctor  ? ?Follow-Up Plan: ?Attend Core 3 ?Work towards following your personal food plan.  ?

## 2021-07-21 ENCOUNTER — Other Ambulatory Visit: Payer: Self-pay

## 2021-07-21 ENCOUNTER — Encounter: Payer: Self-pay | Admitting: Nurse Practitioner

## 2021-07-21 MED ORDER — METFORMIN HCL 500 MG PO TABS: 500.0000 mg | ORAL_TABLET | Freq: Two times a day (BID) | ORAL | 0 refills | Status: DC

## 2021-07-22 ENCOUNTER — Encounter: Payer: Self-pay | Admitting: Dietician

## 2021-07-22 ENCOUNTER — Encounter: Payer: Medicare HMO | Admitting: Dietician

## 2021-07-22 DIAGNOSIS — E119 Type 2 diabetes mellitus without complications: Secondary | ICD-10-CM

## 2021-07-22 DIAGNOSIS — E11620 Type 2 diabetes mellitus with diabetic dermatitis: Secondary | ICD-10-CM | POA: Diagnosis not present

## 2021-07-22 DIAGNOSIS — Z713 Dietary counseling and surveillance: Secondary | ICD-10-CM | POA: Diagnosis not present

## 2021-07-22 NOTE — Progress Notes (Signed)
Patient was seen on 07/22/2021 for the third of a series of three diabetes self-management courses at the [facility].   State the amount of activity recommended for healthy living Describe activities suitable for individual needs Identify ways to regularly incorporate activity into daily life Identify barriers to activity and ways to over come these barriers Identify diabetes medications being personally used and their primary action for lowering glucose and possible side effects Describe role of stress on blood glucose and develop strategies to address psychosocial issues Identify diabetes complications and ways to prevent them Explain how to manage diabetes during illness Evaluate success in meeting personal goal Establish 2-3 goals that they will plan to diligently work on  Goals:  I will eat less unhealthy fats I will test my glucose at least 2 times a day, 7 days a week  Your patient has identified these potential barriers to change:  Motivation Finances  Your patient has identified their diabetes self-care support plan as  Family Support   Plan:  Attend Support Group as desired

## 2021-07-30 ENCOUNTER — Telehealth: Payer: Self-pay | Admitting: *Deleted

## 2021-07-30 DIAGNOSIS — G4733 Obstructive sleep apnea (adult) (pediatric): Secondary | ICD-10-CM

## 2021-07-30 DIAGNOSIS — E1169 Type 2 diabetes mellitus with other specified complication: Secondary | ICD-10-CM

## 2021-07-30 DIAGNOSIS — Z7984 Long term (current) use of oral hypoglycemic drugs: Secondary | ICD-10-CM

## 2021-07-30 DIAGNOSIS — E785 Hyperlipidemia, unspecified: Secondary | ICD-10-CM

## 2021-07-30 NOTE — Telephone Encounter (Signed)
The patient has been notified of the result. Per dpr Left detailed message on voicemail and informed patient to call back with questions.Wendy Mosley, [name] 07/30/2021 12:08 PM

## 2021-07-30 NOTE — Telephone Encounter (Signed)
-----   Message from Lauralee Evener, [place] sent at 07/08/2021  2:41 PM EDT -----  ----- Message ----- From: Sueanne Margarita, MD Sent: 07/08/2021  10:16 AM EDT To: [facility] Sleep Studies  Please let patient know that they have sleep apnea.  Recommend therapeutic CPAP titration for treatment of patient's sleep disordered breathing.  If unable to perform an in lab titration then initiate ResMed auto CPAP from 4 to 15cm H2O with heated humidity and mask of choice and overnight pulse ox on CPAP.

## 2021-07-31 ENCOUNTER — Telehealth: Payer: Medicare HMO

## 2021-07-31 ENCOUNTER — Ambulatory Visit (INDEPENDENT_AMBULATORY_CARE_PROVIDER_SITE_OTHER): Payer: Medicare HMO

## 2021-07-31 DIAGNOSIS — K219 Gastro-esophageal reflux disease without esophagitis: Secondary | ICD-10-CM

## 2021-07-31 DIAGNOSIS — E11620 Type 2 diabetes mellitus with diabetic dermatitis: Secondary | ICD-10-CM

## 2021-07-31 DIAGNOSIS — I10 Essential (primary) hypertension: Secondary | ICD-10-CM

## 2021-07-31 DIAGNOSIS — E782 Mixed hyperlipidemia: Secondary | ICD-10-CM

## 2021-07-31 NOTE — Telephone Encounter (Signed)
Return call: The patient has been notified of the result and verbalized understanding.  All questions (if any) were answered. Marolyn Hammock, [place] 07/31/2021 1:85 PM    Will precert titration

## 2021-07-31 NOTE — Addendum Note (Signed)
Addended by: Freada Bergeron on: 07/31/2021 02:45 PM   Modules accepted: Orders

## 2021-08-01 NOTE — Chronic Care Management (AMB) (Signed)
Chronic Care Management   CCM RN Visit Note  07/31/2021 Name: Wendy Mosley MRN: [id number] DOB: 1950/06/15  Subjective: Wendy Mosley is a 71 y.o. year old female who is a primary care patient of [name], [place]. The care management team was consulted for assistance with disease management and care coordination needs.    Engaged with patient by telephone for follow up visit in response to provider referral for case management and/or care coordination services.   Consent to Services:  The patient was given information about Chronic Care Management services, agreed to services, and gave verbal consent prior to initiation of services.  Please see initial visit note for detailed documentation.   Patient agreed to services and verbal consent obtained.   Assessment: Review of patient past medical history, allergies, medications, health status, including review of consultants reports, laboratory and other test data, was performed as part of comprehensive evaluation and provision of chronic care management services.   SDOH (Social Determinants of Health) assessments and interventions performed:  Yes, no acute needs   CCM Care Plan  Allergies  Allergen Reactions   Lipitor [Atorvastatin] Other (See Comments)    Hair loss   Sulfonamide Derivatives     Rash/itching     Outpatient Encounter Medications as of 07/31/2021  Medication Sig   aspirin EC 81 MG tablet Take 1 tablet (81 mg total) by mouth daily. Swallow whole.   B COMPLEX-ZINC PO Take 1 tablet by mouth daily.   clobetasol cream (TEMOVATE) 0.05 % Apply to affected area twice as needed.   clotrimazole (GYNE-LOTRIMIN) 1 % vaginal cream Place 1 Applicatorful vaginally at bedtime.   diclofenac Sodium (VOLTAREN) 1 % GEL Apply 2 g topically 4 (four) times daily.   ezetimibe (ZETIA) 10 MG tablet Take 1 tablet (10 mg total) by mouth daily.   ibuprofen (ADVIL,MOTRIN) 200 MG tablet Take 400-600 mg by mouth every 6 (six) hours as needed for  moderate pain.   loratadine (CLARITIN) 10 MG tablet Take 10 mg by mouth daily as needed for allergies.   metFORMIN (GLUCOPHAGE) 500 MG tablet Take 1 tablet (500 mg total) by mouth 2 (two) times daily with a meal.   metoprolol tartrate (LOPRESSOR) 25 MG tablet Take 1 tablet (25 mg total) by mouth 2 (two) times daily.   nitroGLYCERIN (NITROSTAT) 0.4 MG SL tablet Place 1 tablet (0.4 mg total) under the tongue every 5 (five) minutes x 3 doses as needed for chest pain.   pantoprazole (PROTONIX) 20 MG tablet Take 20 mg by mouth daily as needed for heartburn.   No facility-administered encounter medications on file as of 07/31/2021.    Patient Active Problem List   Diagnosis Date Noted   Systolic and diastolic CHF, acute ([place]) 06/17/2021   Chest pain 06/15/2021   SVT (supraventricular tachycardia) ([place]) 06/15/2021   Benign essential HTN 06/15/2021   HLD (hyperlipidemia) 06/15/2021   Tobacco use 06/15/2021   Controlled type 2 diabetes mellitus without complication, without long-term current use of insulin ([place]) 06/15/2021   CAD in native artery 06/06/2020   Chronic right shoulder pain 05/15/2020   Palpitations 11/30/2019   Constipation 09/15/2018   Colonic polyp 12/24/2016   Hyperlipidemia 11/14/2015   HTN (hypertension) 11/12/2015   Arthritis of right knee 11/12/2015   Dermatitis 11/12/2015   Allergic rhinitis 55/97/4163   Lichen sclerosus et atrophicus of the vulva 07/12/2011   GERD (gastroesophageal reflux disease) 07/02/2011   Postmenopausal atrophic vaginitis 07/02/2011    Conditions to be addressed/monitored: HTN,  HLD, GERD, Type 2 diabetes mellitus  Care Plan : RN Care Manager Plan of Care  Updates made by Lynne Logan, RN since 07/31/2021 12:00 AM     Problem: No plan of care established for management of chronic disease states (HTN, HLD, GERD)   Priority: High     Long-Range Goal: Development of plan of care for chronic disease management for HTN, HLD, GERD   Start Date:  12/27/2020  Expected End Date: 12/27/2021  Recent Progress: On track  Priority: High  Note:   Current Barriers:  Knowledge Deficits related to plan of care for management of HTN, HLD, GERD Chronic Disease Management support and education needs related to HTN, HLD, GERD  RNCM Clinical Goal(s):  Patient will verbalize basic understanding of  HTN, HLD, GERD disease process and self health management plan   take all medications exactly as prescribed and will call provider for medication related questions attend all scheduled medical appointments: reschedule PCP physical demonstrate Improved health management independence   continue to work with RN Care Manager to address care management and care coordination needs related to  HTN, HLD, GERD will demonstrate ongoing self health care management ability    through collaboration with RN Care manager, provider, and care team.   Interventions: 1:1 collaboration with primary care provider regarding development and update of comprehensive plan of care as evidenced by provider attestation and co-signature Inter-disciplinary care team collaboration (see longitudinal plan of care) Evaluation of current treatment plan related to  self management and patient's adherence to plan as established by provider  Hyperlipidemia Interventions:  (Status:  Condition stable.  Not addressed this visit.) Long Term Goal Evaluation of current treatment plan related to Hyperlipidemia, self-management and patient's adherence to plan as established by provider Reviewed and discussed recent Assessment/Plan following a recent IP admission with the following dx:   SVT (supraventricular tachycardia) (HCC)  Hypotension, unspecified hypotension type  Chest pain, unspecified type  Reviewed importance of limiting foods high in cholesterol Reviewed exercise goals and target of 150 minutes per week Mailed printed educational materials related to Hyperlipidemia management Reviewed  scheduled/upcoming provider appointment including: Cardiology follow up scheduled for 07/03/21 '@8' :00 AM  Discussed plans with patient for ongoing care management follow up and provided patient with direct contact information for care management team Lipid Panel     Component Value Date/Time   CHOL 193 06/15/2021 1717   CHOL 201 (H) 05/22/2021 1224   TRIG 74 06/15/2021 1717   HDL 32 (L) 06/15/2021 1717   HDL 40 05/22/2021 1224   CHOLHDL 6.0 06/15/2021 1717   VLDL 15 06/15/2021 1717   LDLCALC 146 (H) 06/15/2021 1717   LDLCALC 132 (H) 05/22/2021 1224   LDLDIRECT 138 (H) 07/02/2011 1521   LABVLDL 29 05/22/2021 1224     Diabetes Interventions:  (Status:  Goal on track:  Yes.) Long Term Goal Assessed patient's understanding of A1c goal:  <5.7% Provided education to patient about basic DM disease process Reviewed medications with patient and discussed importance of medication adherence, patient reports adherence although, she would like to come off of this medication in the near future, she will discuss with PCP  Counseled on importance of regular laboratory monitoring as prescribed Review of patient status, including review of consultants reports, relevant laboratory and other test results, and medications completed Determined patient is self monitoring her sugars once daily, reports last cbg was 106 after a meal  Educated patient on dietary and exercise recommendations; daily glycemic control FBS 80-130, <  180 after meals;15'15' rule Mailed printed educational materials related to Hypo/Hyperglycemic safety tool; Diabetes Care Schedule; Preventing complications from Diabetes  Lab Results  Component Value Date   HGBA1C 6.4 (H) 06/15/2021   Cataract Removal Interventions:  (Status:  Goal Met.)  Short Term Goal Evaluation of current treatment plan related to  Cataract Removal , self-management and patient's adherence to plan as established by provider Determined patient completed her Cataract  surgery with good results, although she is experiencing iritis  Determined patient is adhering to prescribed Prednisolone eye drops as directed with good effectiveness  Discussed the importance of keeping Dr. Katy Fitch well informed of changes to her eye or visual acuity and to keep all MD follow up appointments  Discussed plans with patient for ongoing care management follow up and provided patient with direct contact information for care management team  GERD Interventions:  (Status:  Condition stable.  Not addressed this visit.)  Long Term Goal Evaluation of current treatment plan related to GERD, self-management and patient's adherence to plan as established by provider. Determined patient continues to have mild intermittent symptoms suggestive of GERD Determined patient would like to restart Dexilant and would like samples if available due to cost  Sent in basket message to PCP provider requesting samples of Dexilant if available  Reviewed and discussed dietary changes patient is making to help eliminate food/liquids that may trigger her GERD Discussed plans with patient for ongoing care management follow up and provided patient with direct contact information for care management team  Inpatient admission:  (Status:  Goal Met.)  Short Term Goal Reviewed and discussed post discharge instructions from recent IP event Determined patient verbalizes understanding of her discharge recommendations Determined patient completed her post d/c follow up with PCP Discussed upcoming follow up with Cardiology scheduled for 07/03/21 '@8' :00 AM See other goals for ongoing chronic problems/interventions  Patient Goals/Self-Care Activities: Take all medications as prescribed Attend all scheduled provider appointments Call pharmacy for medication refills 3-7 days in advance of running out of medications Perform all self care activities independently  Perform IADL's (shopping, preparing meals, housekeeping,  managing finances) independently Call provider office for new concerns or questions  drink 6 to 8 glasses of water each day fill half of plate with vegetables manage portion size adhere to prescribed diet: low trans/saturated fat develop an exercise routine  Follow Up Plan:  Telephone follow up appointment with care management team member scheduled for:  11/28/21       Barb Merino, RN, BSN, CCM Care Management Coordinator [place] [facility]  Direct Phone: [phone]

## 2021-08-01 NOTE — Patient Instructions (Signed)
Visit Information  Thank you for taking time to visit with me today. Please don't hesitate to contact me if I can be of assistance to you before our next scheduled telephone appointment.  Following are the goals we discussed today:  (Copy and paste patient goals from clinical care plan here)  Our next appointment is by telephone on 11/28/21 at 11:15 AM   Please call the care guide team at [phone] if you need to cancel or reschedule your appointment.   If you are experiencing a Mental Health or [place] or need someone to talk to, please call [phone]-TALK (toll free, 24 hour hotline)   Patient verbalizes understanding of instructions and care plan provided today and agrees to view in [place]. Active MyChart status and patient understanding of how to access instructions and care plan via MyChart confirmed with patient.     Barb Merino, RN, BSN, CCM Care Management Coordinator [place] [facility]  Direct Phone: [phone]

## 2021-08-04 ENCOUNTER — Encounter (HOSPITAL_BASED_OUTPATIENT_CLINIC_OR_DEPARTMENT_OTHER): Payer: Self-pay

## 2021-08-16 ENCOUNTER — Other Ambulatory Visit: Payer: Self-pay | Admitting: Nurse Practitioner

## 2021-08-16 DIAGNOSIS — R1013 Epigastric pain: Secondary | ICD-10-CM

## 2021-08-18 NOTE — Telephone Encounter (Signed)
Prior Authorization for TITRATION sent to [facility] via web portal. Tracking Number [id number]  -APPROVED-AUTHORIZATION NUMBER [id number] Procedure Tracking #: [id number] VALID- Sep 17 2021 - Oct 17 2021

## 2021-08-29 DIAGNOSIS — E11620 Type 2 diabetes mellitus with diabetic dermatitis: Secondary | ICD-10-CM

## 2021-08-29 DIAGNOSIS — I10 Essential (primary) hypertension: Secondary | ICD-10-CM

## 2021-08-29 DIAGNOSIS — E782 Mixed hyperlipidemia: Secondary | ICD-10-CM

## 2021-09-03 NOTE — Progress Notes (Unsigned)
Office Visit    Patient Name: Wendy Mosley Date of Encounter: 09/04/2021  PCP:  [name], [facility]  Cardiologist:  Skeet Latch, MD  Advanced Practice Provider:  No care team member to display Electrophysiologist:  None     Chief Complaint    Wendy Mosley is a 71 y.o. female with a hx of mild nonobstructive coronary disease, SVT, prediabetes, hypertension, hyperlipidemia, prior tobacco use, HFmrEF, palpitations presents today for BP follow up  Past Medical History    Past Medical History:  Diagnosis Date   CAD in native artery 06/06/2020   CAD native.  Calcium score 176, 85th percentile 04/2020   Cataracts, bilateral    right eye - just watching   Diabetes mellitus without complication ([place])    Diverticulitis    GERD (gastroesophageal reflux disease) 2006   Hearing loss    right ear - no hearing aid   Heart attack ([place]) 2008   Hypertension    Irregular heart beat 1973   Palpitations 11/30/2019   SVD (spontaneous vaginal delivery)    x 3   Past Surgical History:  Procedure Laterality Date   APPENDECTOMY  1967   BREAST SURGERY     cyst removed from right breast at age 34   [place] N/A 02/21/2018   Procedure: DILATATION & CURETTAGE/HYSTEROSCOPY WITH [place];  Surgeon: Servando Salina, MD;  Location: [facility];  Service: Gynecology;  Laterality: N/A;   EXTERNAL EAR SURGERY  1974   TUBAL LIGATION  1977    Allergies  Allergies  Allergen Reactions   Lipitor [Atorvastatin] Other (See Comments)    Hair loss   Sulfonamide Derivatives     Rash/itching     History of Present Illness    Wendy Mosley is a 71 y.o. female with a hx of mild nonobstructive coronary disease, SVT, HFmrEF, prediabetes, hypertension, hyperlipidemia, prior tobacco use, palpitations last seen 06/06/20.  Established with Dr. Tommie Raymond  September 2021 due to palpitations.  EKG revealed sinus rhythm with no arrhythmia.  Thyroid function, CMP, CBC unremarkable.  Her blood pressure and cholesterol are noted to be poorly controlled but she was hesitant regarding medications.  Coronary calcium score 176 placing her in the 85th percentile 04/2020.  When last seen 06/06/2020 she noted understanding of play grieving the loss of her daughter January 2022 from cancer.  She was recommended to continue atorvastatin for secondary prevention of coronary disease.  She was seen in the ED 06/15/2021 after presenting with generalized weakness and chest pain.  CT head no acute findings.  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  There is also periventricular and subcortical T2 hyperintensities bilaterally likely reflect sequela of chronic microvascular ischemia.  Echo 06/16/21 EF 40-81%, grade 1 diastolic dysfunction, no significant valvular abnormalities.  Cardiac CTA 06/16/2021 with coronary calcium score to 38 placing her in the 87th percentile for age and sex matched control with mild nonobstructive coronary disease.  Due to hair loss on atorvastatin and was discharged on rosuvastatin.  At follow up 07/03/21 noted snoring, elevated BP. Losartan initiated. Sleep study ordered and found OSA.   She presents today for follow-up.  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  SInce last seen lost her sister unexpectedly to cancer, offered my condolences. BP has been well  controlled at home. Notes stomach issues, indigestion which she attributes to Metformin. Interested in trialing alternate agent. Walks in neighborhood for exercise. Reports no shortness of breath nor dyspnea on exertion. Reports no chest pain, pressure, or tightness. No edema, orthopnea, PND. Reports no palpitations.    EKGs/Labs/Other Studies Reviewed:   The following studies were reviewed today:  Coronary calcium score  04/18/2020 IMPRESSION: Coronary calcium score of 176. This was 85th percentile for age and sex matched control.   Recommend aggressive risk factor modification, including LDL goal <70.   Echo 06/16/2021   1. Left ventricular ejection fraction, by estimation, is 45 to 50%. The  left ventricle has mildly decreased function. The left ventricle  demonstrates global hypokinesis. Left ventricular diastolic parameters are  consistent with Grade I diastolic  dysfunction (impaired relaxation).   2. Right ventricular systolic function is normal. The right ventricular  size is normal. Tricuspid regurgitation signal is inadequate for assessing  PA pressure.   3. The mitral valve is normal in structure. Trivial mitral valve  regurgitation. No evidence of mitral stenosis.   4. The aortic valve is tricuspid. Aortic valve regurgitation is not  visualized. No aortic stenosis is present.   5. The inferior vena cava is normal in size with greater than 50%  respiratory variability, suggesting right atrial pressure of 3 mmHg.   Coronary CTA 06/16/2021 FINDINGS: Coronary calcium score: The patient's coronary artery calcium score is 238, which places the patient in the 87 percentile.   Coronary arteries: Normal coronary origins.  Left dominance.   Right Coronary Artery: Small caliber vessel. Trivial mixed calcified and noncalcified plaque with 1-24% stenosis maximum.   Left Main Coronary Artery: Normal caliber vessel. Mixed calcified and noncalcified plaque with 1-24% stenosis.   Left Anterior Descending Coronary Artery: Normal caliber vessel. Mixed calcified and noncalcified plaque in proximal LAD and portions of mid LAD. Maximum 1-24% stenosis. Gives rise to one large diagonal branch.   Left Circumflex Artery: Normal caliber vessel, gives rise to PDA. Mixed calcified and noncalcified plaque in proximal portion, with a focal calcified plaque at the origin of OM with 25-49% stenosis. Gives rise  to one large OM branch.   Aorta: Normal size, 28 mm at the mid ascending aorta (level of the PA bifurcation) measured double oblique. Aortic atherosclerosis, with likely mural thrombus in abdominal aorta. No dissection seen in visualized portions of the aorta.   Aortic Valve: No calcifications. Trileaflet.   Other findings:   Normal pulmonary vein drainage into the left atrium.   Normal left atrial appendage without a thrombus.   Normal size of the pulmonary artery.   Normal appearance of the pericardium.   IMPRESSION: 1. Minimal nonobstructive CAD, CADRADS = 2.   2. Coronary calcium score of 238. This was 87th percentile for age and sex matched control.   3. Normal coronary origin with left dominance.   4. Aortic atherosclerosis, with likely mural thrombus in abdominal aorta   EKG: No EKG today  Recent Labs: 06/15/2021: B Natriuretic Peptide 163.8; TSH 1.538 06/16/2021: ALT 19; BUN 8; Creatinine, Ser 0.84; Hemoglobin 12.9; Magnesium 1.9; Platelets 226; Potassium 3.6; Sodium 140  Recent Lipid Panel    Component Value Date/Time   CHOL 193 06/15/2021 1717   CHOL 201 (H) 05/22/2021 1224   TRIG 74 06/15/2021 1717   HDL 32 (L) 06/15/2021 1717   HDL 40 05/22/2021 1224   CHOLHDL 6.0 06/15/2021 1717   VLDL 15 06/15/2021 1717   LDLCALC 146 (H) 06/15/2021  1717   LDLCALC 132 (H) 05/22/2021 1224   LDLDIRECT 138 (H) 07/02/2011 1521    Home Medications   Current Meds  Medication Sig   aspirin EC 81 MG tablet Take 1 tablet (81 mg total) by mouth daily. Swallow whole.   B COMPLEX-ZINC PO Take 1 tablet by mouth daily.   clobetasol cream (TEMOVATE) 0.05 % Apply to affected area twice as needed.   clotrimazole (GYNE-LOTRIMIN) 1 % vaginal cream Place 1 Applicatorful vaginally at bedtime.   diclofenac Sodium (VOLTAREN) 1 % GEL Apply 2 g topically 4 (four) times daily.   ezetimibe (ZETIA) 10 MG tablet Take 1 tablet (10 mg total) by mouth daily.   ibuprofen (ADVIL,MOTRIN) 200 MG  tablet Take 400-600 mg by mouth every 6 (six) hours as needed for moderate pain.   loratadine (CLARITIN) 10 MG tablet Take 10 mg by mouth daily as needed for allergies.   metFORMIN (GLUCOPHAGE) 500 MG tablet Take 1 tablet (500 mg total) by mouth 2 (two) times daily with a meal.   metoprolol tartrate (LOPRESSOR) 25 MG tablet Take 1 tablet (25 mg total) by mouth 2 (two) times daily.   nitroGLYCERIN (NITROSTAT) 0.4 MG SL tablet Place 1 tablet (0.4 mg total) under the tongue every 5 (five) minutes x 3 doses as needed for chest pain.   pantoprazole (PROTONIX) 20 MG tablet TAKE 1 TABLET BY MOUTH EVERY DAY     Review of Systems      All other systems reviewed and are otherwise negative except as noted above.  Physical Exam    VS:  BP 124/86   Pulse 65   Ht '5\' 4"'$  (1.626 m)   Wt 169 lb (76.7 kg)   BMI 29.01 kg/m  , BMI Body mass index is 29.01 kg/m.  Wt Readings from Last 3 Encounters:  09/04/21 169 lb (76.7 kg)  07/09/21 169 lb (76.7 kg)  07/03/21 171 lb (77.6 kg)     GEN: Well nourished, well developed, in no acute distress. HEENT: normal. Neck: Supple, no JVD, carotid bruits, or masses. Cardiac: RRR, no murmurs, rubs, or gallops. No clubbing, cyanosis, edema.  Radials/PT 2+ and equal bilaterally.  Respiratory:  Respirations regular and unlabored, clear to auscultation bilaterally. GI: Soft, nontender, nondistended. MS: No deformity or atrophy. Skin: Warm and dry, no rash. Neuro:  Strength and sensation are intact. Psych: Normal affect.  Assessment & Plan    Mild nonobstructive CAD -cardiac CTA 05/2021 with mild nonobstructive coronary disease.  GDMT includes aspirin, metoprolol, zetia.  Intolerant of statin-detailed below.  Reports no anginal symptoms.  Heart healthy diet and regular cardiovascular exercise encouraged.    HFmrEF -Echo 05/2021 LVEF 45 to 50%.  Nonischemic as cardiac CTA 06/2018 revealed mild nonobstructive coronary disease.  06/15/2021 TSH 1.53.  Pending CPAP  titration study.  Reviewed low-sodium, heart healthy diet and 150 minutes of exercise weekly recommendation.  GDMT includes losartan 20 mg daily, metoprolol titrate 25 mg twice daily.  We will add Jardiance 10 mg daily.  BMP in 2 weeks.  Future considerations include addition of MRA.  Does not require loop diuretic at this time.  SVT -quiescent on metoprolol 25 mg twice daily. May take additional half tablet as needed for breakthrough palpitations.  HTN - BP well controlled. Continue current antihypertensive regimen.    HLD, LDL goal <70 - 06/15/21 total cholesterol 201, HDL 32, LDL 146, triglycerides 74.  Intolerant of atorvastatin and hesitant to try alternate statin.  She is agreeable to continue Zetia  10 mg daily. Referred to our pharmacist for lipid lowering therapy but they were unable to reach her to schedule appt and letter mailed. Update lipid panel, CMP.   DM2 -Reports stomach side effects and indigestion on metformin.  Discontinue metformin.  Start Jardiance 10 mg daily.  Samples provided in clinic.  Increase Protonix to 40 mg daily for 2 weeks then return to 40 mg daily.  OSA - Recommended for CPAP titration by Dr. Radford Pax. Upcoming in a couple weeks.      Disposition: Follow up in 3 mos with Skeet Latch, MD or APP.  Signed, Loel Dubonnet, NP 09/04/2021, 8:18 AM [place]

## 2021-09-04 ENCOUNTER — Telehealth (HOSPITAL_BASED_OUTPATIENT_CLINIC_OR_DEPARTMENT_OTHER): Payer: Self-pay

## 2021-09-04 ENCOUNTER — Ambulatory Visit (HOSPITAL_BASED_OUTPATIENT_CLINIC_OR_DEPARTMENT_OTHER): Payer: Medicare HMO | Admitting: Family

## 2021-09-04 ENCOUNTER — Encounter (HOSPITAL_BASED_OUTPATIENT_CLINIC_OR_DEPARTMENT_OTHER): Payer: Self-pay | Admitting: Family

## 2021-09-04 VITALS — BP 124/86 | HR 65 | Ht 64.0 in | Wt 169.0 lb

## 2021-09-04 DIAGNOSIS — I471 Supraventricular tachycardia: Secondary | ICD-10-CM | POA: Diagnosis not present

## 2021-09-04 DIAGNOSIS — I10 Essential (primary) hypertension: Secondary | ICD-10-CM | POA: Diagnosis not present

## 2021-09-04 DIAGNOSIS — I5022 Chronic systolic (congestive) heart failure: Secondary | ICD-10-CM

## 2021-09-04 DIAGNOSIS — E1165 Type 2 diabetes mellitus with hyperglycemia: Secondary | ICD-10-CM

## 2021-09-04 DIAGNOSIS — G4733 Obstructive sleep apnea (adult) (pediatric): Secondary | ICD-10-CM

## 2021-09-04 DIAGNOSIS — R1013 Epigastric pain: Secondary | ICD-10-CM | POA: Diagnosis not present

## 2021-09-04 DIAGNOSIS — E785 Hyperlipidemia, unspecified: Secondary | ICD-10-CM | POA: Diagnosis not present

## 2021-09-04 DIAGNOSIS — I25118 Atherosclerotic heart disease of native coronary artery with other forms of angina pectoris: Secondary | ICD-10-CM | POA: Diagnosis not present

## 2021-09-04 LAB — LIPID PANEL
Chol/HDL Ratio: 4.8 ratio — ABNORMAL HIGH (ref 0.0–4.4)
Cholesterol, Total: 187 mg/dL (ref 100–199)
HDL: 39 mg/dL — ABNORMAL LOW (ref >39)
LDL Chol Calc (NIH): 121 mg/dL — ABNORMAL HIGH (ref 0–99)
Triglycerides: 150 mg/dL — ABNORMAL HIGH (ref 0–149)
VLDL Cholesterol Cal: 27 mg/dL (ref 5–40)

## 2021-09-04 LAB — COMPREHENSIVE METABOLIC PANEL
ALT: 14 IU/L (ref 0–32)
AST: 16 IU/L (ref 0–40)
Albumin/Globulin Ratio: 1.7 (ref 1.2–2.2)
Albumin: 4.5 g/dL (ref 3.7–4.7)
Alkaline Phosphatase: 128 IU/L — ABNORMAL HIGH (ref 44–121)
BUN/Creatinine Ratio: 12 (ref 12–28)
BUN: 11 mg/dL (ref 8–27)
Bilirubin Total: 0.3 mg/dL (ref 0.0–1.2)
CO2: 23 mmol/L (ref 20–29)
Calcium: 9.5 mg/dL (ref 8.7–10.3)
Chloride: 104 mmol/L (ref 96–106)
Creatinine, Ser: 0.91 mg/dL (ref 0.57–1.00)
Globulin, Total: 2.7 g/dL (ref 1.5–4.5)
Glucose: 94 mg/dL (ref 70–99)
Potassium: 4.9 mmol/L (ref 3.5–5.2)
Sodium: 142 mmol/L (ref 134–144)
Total Protein: 7.2 g/dL (ref 6.0–8.5)
eGFR: 67 mL/min/{1.73_m2} (ref >59)

## 2021-09-04 LAB — HEMOGLOBIN A1C
Est. average glucose Bld gHb Est-mCnc: 131 mg/dL
Hgb A1c MFr Bld: 6.2 % — ABNORMAL HIGH (ref 4.8–5.6)

## 2021-09-04 MED ORDER — EMPAGLIFLOZIN 10 MG PO TABS
10.0000 mg | ORAL_TABLET | Freq: Every day | ORAL | 5 refills | Status: DC
Start: 2021-09-04 — End: 2022-01-02

## 2021-09-04 MED ORDER — PANTOPRAZOLE SODIUM 20 MG PO TBEC: DELAYED_RELEASE_TABLET | ORAL | 1 refills | Status: DC

## 2021-09-04 NOTE — Telephone Encounter (Signed)
Per Cover My Med Prior Auth not required. Pt. Notified via mychart message

## 2021-09-04 NOTE — Progress Notes (Signed)
Jardiance Samples given today-   2 week supply ( 2 box)  LOT #: [id number] EXP: 1/25  14 day free card and copay card given!

## 2021-09-04 NOTE — Patient Instructions (Addendum)
Medication Instructions:  Your physician has recommended you make the following change in your medication:    INCREASE Pantoprazole (Protonix) to '40mg'$  daily for 2 weeks THEN return to Pantoprazole (Protonix) '20mg'$  daily  *this is to help with acid reflux  STOP Metformin  START Jardiance one '10mg'$  tablet daily *this is to treat your diabetes, help your heart muscle work well, and reduce cardiovascular risk  CONTINUE Metoprolol '25mg'$  twice daily. If you have breakthrough palpitations you may take an extra half or whole tablet of your Metoprolol.  *If you need a refill on your cardiac medications before your next appointment, please call your pharmacy*  Lab Work: Your physician recommends that you return for lab work today: CMP, lipid panel, hemoglobin A1c  Your physician recommends that you return for lab work in 2 weeks for BMP  If you have labs (blood work) drawn today and your tests are completely normal, you will receive your results only by: [place] (if you have MyChart) OR A paper copy in the mail If you have any lab test that is abnormal or we need to change your treatment, we will call you to review the results.   Testing/Procedures: None ordered today.   Follow-Up: At [facility], you and your health needs are our priority.  As part of our continuing mission to provide you with exceptional heart care, we have created designated Provider Care Teams.  These Care Teams include your primary Cardiologist (physician) and Advanced Practice Providers (APPs -  Physician Assistants and Nurse Practitioners) who all work together to provide you with the care you need, when you need it.  We recommend signing up for the patient portal called "MyChart".  Sign up information is provided on this After Visit Summary.  MyChart is used to connect with patients for Virtual Visits (Telemedicine).  Patients are able to view lab/test results, encounter notes, upcoming appointments, etc.   Non-urgent messages can be sent to your provider as well.   To learn more about what you can do with MyChart, go to [email or web address].    Your next appointment:   3 month(s)  The format for your next appointment:   In Person  Provider:   Skeet Latch, MD or Loel Dubonnet, NP     Other Instructions  Heart Healthy Diet Recommendations: A low-salt diet is recommended. Meats should be grilled, baked, or boiled. Avoid fried foods. Focus on lean protein sources like fish or chicken with vegetables and fruits. The American Heart Association is a Microbiologist!  American Heart Association Diet and Lifeystyle Recommendations   Exercise recommendations: The American Heart Association recommends 150 minutes of moderate intensity exercise weekly. Try 30 minutes of moderate intensity exercise 4-5 times per week. This could include walking, jogging, or swimming.   Important Information About Sugar

## 2021-09-05 ENCOUNTER — Telehealth (HOSPITAL_BASED_OUTPATIENT_CLINIC_OR_DEPARTMENT_OTHER): Payer: Self-pay

## 2021-09-05 ENCOUNTER — Other Ambulatory Visit (HOSPITAL_BASED_OUTPATIENT_CLINIC_OR_DEPARTMENT_OTHER): Payer: Self-pay

## 2021-09-05 DIAGNOSIS — E785 Hyperlipidemia, unspecified: Secondary | ICD-10-CM

## 2021-09-05 NOTE — Telephone Encounter (Addendum)
Results called to patient who verbalizes understanding! Orders placed.   ----- Message from Loel Dubonnet, NP sent at 09/05/2021  9:01 AM EDT ----- A1c improving.  Normal kidney, electrolytes, liver enzymes.  Alkaline phosphatase very mildly elevated-not of concern.  Cholesterol numbers improved but remain above goal despite Zetia.  She had previously been referred to pharmacy team for consideration of alternate agents but was unable to be reached-please place new referral and provide patient phone number to call them to schedule appointment.

## 2021-09-10 DIAGNOSIS — H524 Presbyopia: Secondary | ICD-10-CM | POA: Diagnosis not present

## 2021-09-10 DIAGNOSIS — H04123 Dry eye syndrome of bilateral lacrimal glands: Secondary | ICD-10-CM | POA: Diagnosis not present

## 2021-09-10 DIAGNOSIS — Z961 Presence of intraocular lens: Secondary | ICD-10-CM | POA: Diagnosis not present

## 2021-09-10 DIAGNOSIS — E119 Type 2 diabetes mellitus without complications: Secondary | ICD-10-CM | POA: Diagnosis not present

## 2021-09-10 DIAGNOSIS — H43393 Other vitreous opacities, bilateral: Secondary | ICD-10-CM | POA: Diagnosis not present

## 2021-09-10 LAB — HM DIABETES EYE EXAM

## 2021-09-17 ENCOUNTER — Other Ambulatory Visit: Payer: Self-pay

## 2021-09-28 ENCOUNTER — Ambulatory Visit (HOSPITAL_BASED_OUTPATIENT_CLINIC_OR_DEPARTMENT_OTHER): Payer: Medicare HMO | Attending: Cardiology | Admitting: Cardiology

## 2021-09-28 DIAGNOSIS — G4733 Obstructive sleep apnea (adult) (pediatric): Secondary | ICD-10-CM | POA: Insufficient documentation

## 2021-09-30 ENCOUNTER — Encounter: Payer: Self-pay | Admitting: *Deleted

## 2021-10-04 NOTE — Procedures (Signed)
   Patient Name: Wendy Mosley, Wendy Mosley Date: 09/28/2021 Gender: Female D.O.B: 1950-08-02 Age (years): 44 Referring Provider: Fransico Him MD, ABSM Height (inches): 64 Interpreting Physician: Fransico Him MD, ABSM Weight (lbs): 168 RPSGT: Zadie Rhine BMI: 29 MRN: [id number] Neck Size: 15.00  CLINICAL INFORMATION The patient is referred for a CPAP titration to treat sleep apnea.  SLEEP STUDY TECHNIQUE As per the AASM Manual for the Scoring of Sleep and Associated Events v2.3 (April 2016) with a hypopnea requiring 4% desaturations.  The channels recorded and monitored were frontal, central and occipital EEG, electrooculogram (EOG), submentalis EMG (chin), nasal and oral airflow, thoracic and abdominal wall motion, anterior tibialis EMG, snore microphone, electrocardiogram, and pulse oximetry. Continuous positive airway pressure (CPAP) was initiated at the beginning of the study and titrated to treat sleep-disordered breathing.  MEDICATIONS Medications self-administered by patient taken the night of the study : Jardiance, EZETIMIBE, METOPROLOL  TECHNICIAN COMMENTS Comments added by technician: One restroom visted Comments added by scorer: N/A  RESPIRATORY PARAMETERS Optimal PAP Pressure (cm):8  AHI at Optimal Pressure (/hr):0 Overall Minimal O2 (%):75.0  Supine % at Optimal Pressure (%):100 Minimal O2 at Optimal Pressure (%): 89.0   SLEEP ARCHITECTURE The study was initiated at 10:36:13 PM and ended at 4:50:25 AM.  Sleep onset time was 5.5 minutes and the sleep efficiency was 80.0%. The total sleep time was 299.5 minutes.  The patient spent 3.3% of the night in stage N1 sleep, 64.6% in stage N2 sleep, 1.0% in stage N3 and 31.1% in REM.Stage REM latency was 89.0 minutes  Wake after sleep onset was 69.2. Alpha intrusion was absent. Supine sleep was 65.58%.  CARDIAC DATA The 2 lead EKG demonstrated sinus rhythm. The mean heart rate was 64.9 beats per minute. Other EKG findings  include: None.  LEG MOVEMENT DATA The total Periodic Limb Movements of Sleep (PLMS) were 0. The PLMS index was 0.0. A PLMS index of <15 is considered normal in adults.  IMPRESSIONS - The optimal PAP pressure was 8 cm of water. - Central sleep apnea was not noted during this titration (CAI = 0.4/h). - Severe oxygen desaturations were observed during this titration (min O2 = 75.0%). - No snoring was audible during this study. - No cardiac abnormalities were observed during this study. - Clinically significant periodic limb movements were not noted during this study. Arousals associated with PLMs were rare.  DIAGNOSIS - Obstructive Sleep Apnea (G47.33)  RECOMMENDATIONS - Trial of CPAP therapy on 8 cm H2O with a Small size Resmed Full Face AirFit F20 mask and heated humidification. - Avoid alcohol, sedatives and other CNS depressants that may worsen sleep apnea and disrupt normal sleep architecture. - Sleep hygiene should be reviewed to assess factors that may improve sleep quality. - Weight management and regular exercise should be initiated or continued. - [facility] to [facility] for [facility]-evaluation after 6 weeks of therapy  [Electronically signed] 10/04/2021 05:49 PM  Fransico Him MD, ABSM Diplomate, American Board of Sleep Medicine

## 2021-10-16 ENCOUNTER — Telehealth: Payer: Self-pay | Admitting: *Deleted

## 2021-10-16 NOTE — Telephone Encounter (Signed)
-----   Message from Lauralee Evener, [place] sent at 10/06/2021  8:17 AM EDT -----  ----- Message ----- From: Sueanne Margarita, MD Sent: 10/04/2021   5:50 PM EDT To: [facility] Sleep Studies  Please let patient know that they had a successful PAP titration and let DME know that orders are in EPIC.  Please set up 6 week OV with me.

## 2021-10-16 NOTE — Telephone Encounter (Signed)
The patient has been notified of the result and verbalized understanding.  All questions (if any) were answered. Wendy Mosley, [place] 10/16/2021 12:12 PM    Upon patient request DME selection is [name] Patient understands he will be contacted by [facility] to set up his cpap. Patient understands to call if [name] does not contact him with new setup in a timely manner. Patient understands they will be called once confirmation has been received from [facility] that they have received their new machine to schedule 10 week follow up appointment.   [name] notified of new cpap order  Please add to airview Patient was grateful for the call and thanked me.

## 2021-10-17 ENCOUNTER — Encounter: Payer: Self-pay | Admitting: Nurse Practitioner

## 2021-10-21 NOTE — Progress Notes (Signed)
This encounter was created in error - please disregard.

## 2021-10-22 MED ORDER — AMOXICILLIN-POT CLAVULANATE 500-125 MG PO TABS
1.0000 | ORAL_TABLET | Freq: Three times a day (TID) | ORAL | 0 refills | Status: DC
Start: 2021-10-22 — End: 2021-11-24

## 2021-10-22 NOTE — Addendum Note (Signed)
Addended by: [name] F on: 10/22/2021 11:44 AM   Modules accepted: Orders

## 2021-10-24 ENCOUNTER — Encounter: Payer: Self-pay | Admitting: Nurse Practitioner

## 2021-11-19 ENCOUNTER — Ambulatory Visit (INDEPENDENT_AMBULATORY_CARE_PROVIDER_SITE_OTHER): Payer: Medicare HMO

## 2021-11-19 VITALS — BP 120/70 | HR 64 | Temp 98.1°F | Ht 63.2 in | Wt 164.2 lb

## 2021-11-19 DIAGNOSIS — Z0000 Encounter for general adult medical examination without abnormal findings: Secondary | ICD-10-CM | POA: Diagnosis not present

## 2021-11-19 DIAGNOSIS — Z1231 Encounter for screening mammogram for malignant neoplasm of breast: Secondary | ICD-10-CM

## 2021-11-19 DIAGNOSIS — Z23 Encounter for immunization: Secondary | ICD-10-CM

## 2021-11-19 NOTE — Patient Instructions (Addendum)
Wendy Mosley , Thank you for taking time to come for your Medicare Wellness Visit. I appreciate your ongoing commitment to your health goals. Please review the following plan we discussed and let me know if I can assist you in the future.   Screening recommendations/referrals: Colonoscopy: completed 09/12/2019, due 09/11/2029 Mammogram: ordered today Bone Density: completed 08/08/2020 Recommended yearly ophthalmology/optometry visit for glaucoma screening and checkup Recommended yearly dental visit for hygiene and checkup  Vaccinations: Influenza vaccine: today Pneumococcal vaccine: completed 10/25/2019 Tdap vaccine: due Shingles vaccine: needs second dose   Covid-19: 03/15/2021, 02/15/2020, 06/06/2019, 05/16/2019  Advanced directives: dvance directive discussed with you today. Even though you declined this today please call our office should you change your mind and we can give you the proper paperwork for you to fill out.  Conditions/risks identified: none  Next appointment: Follow up in one year for your annual wellness visit    Preventive Care 65 Years and Older, Female Preventive care refers to lifestyle choices and visits with your health care provider that can promote health and wellness. What does preventive care include? A yearly physical exam. This is also called an annual well check. Dental exams once or twice a year. Routine eye exams. Ask your health care provider how often you should have your eyes checked. Personal lifestyle choices, including: Daily care of your teeth and gums. Regular physical activity. Eating a healthy diet. Avoiding tobacco and drug use. Limiting alcohol use. Practicing safe sex. Taking low-dose aspirin every day. Taking vitamin and mineral supplements as recommended by your health care provider. What happens during an annual well check? The services and screenings done by your health care provider during your annual well check will depend on your age,  overall health, lifestyle risk factors, and family history of disease. Counseling  Your health care provider may ask you questions about your: Alcohol use. Tobacco use. Drug use. Emotional well-being. Home and relationship well-being. Sexual activity. Eating habits. History of falls. Memory and ability to understand (cognition). Work and work Statistician. Reproductive health. Screening  You may have the following tests or measurements: Height, weight, and BMI. Blood pressure. Lipid and cholesterol levels. These may be checked every 5 years, or more frequently if you are over 22 years old. Skin check. Lung cancer screening. You may have this screening every year starting at age 30 if you have a 30-pack-year history of smoking and currently smoke or have quit within the past 15 years. Fecal occult blood test (FOBT) of the stool. You may have this test every year starting at age 13. Flexible sigmoidoscopy or colonoscopy. You may have a sigmoidoscopy every 5 years or a colonoscopy every 10 years starting at age 50. Hepatitis C blood test. Hepatitis B blood test. Sexually transmitted disease (STD) testing. Diabetes screening. This is done by checking your blood sugar (glucose) after you have not eaten for a while (fasting). You may have this done every 1-3 years. Bone density scan. This is done to screen for osteoporosis. You may have this done starting at age 65. Mammogram. This may be done every 1-2 years. Talk to your health care provider about how often you should have regular mammograms. Talk with your health care provider about your test results, treatment options, and if necessary, the need for more tests. Vaccines  Your health care provider may recommend certain vaccines, such as: Influenza vaccine. This is recommended every year. Tetanus, diphtheria, and acellular pertussis (Tdap, Td) vaccine. You may need a Td booster every 10  years. Zoster vaccine. You may need this after age  15. Pneumococcal 13-valent conjugate (PCV13) vaccine. One dose is recommended after age 63. Pneumococcal polysaccharide (PPSV23) vaccine. One dose is recommended after age 49. Talk to your health care provider about which screenings and vaccines you need and how often you need them. This information is not intended to replace advice given to you by your health care provider. Make sure you discuss any questions you have with your health care provider. Document Released: 03/15/2015 Document Revised: 11/06/2015 Document Reviewed: 12/18/2014 Elsevier Interactive Patient Education  2017 [place] Prevention in the Home Falls can cause injuries. They can happen to people of all ages. There are many things you can do to make your home safe and to help prevent falls. What can I do on the outside of my home? Regularly fix the edges of walkways and driveways and fix any cracks. Remove anything that might make you trip as you walk through a door, such as a raised step or threshold. Trim any bushes or trees on the path to your home. Use bright outdoor lighting. Clear any walking paths of anything that might make someone trip, such as rocks or tools. Regularly check to see if handrails are loose or broken. Make sure that both sides of any steps have handrails. Any raised decks and porches should have guardrails on the edges. Have any leaves, snow, or ice cleared regularly. Use sand or salt on walking paths during winter. Clean up any spills in your garage right away. This includes oil or grease spills. What can I do in the bathroom? Use night lights. Install grab bars by the toilet and in the tub and shower. Do not use towel bars as grab bars. Use non-skid mats or decals in the tub or shower. If you need to sit down in the shower, use a plastic, non-slip stool. Keep the floor dry. Clean up any water that spills on the floor as soon as it happens. Remove soap buildup in the tub or shower  regularly. Attach bath mats securely with double-sided non-slip rug tape. Do not have throw rugs and other things on the floor that can make you trip. What can I do in the bedroom? Use night lights. Make sure that you have a light by your bed that is easy to reach. Do not use any sheets or blankets that are too big for your bed. They should not hang down onto the floor. Have a firm chair that has side arms. You can use this for support while you get dressed. Do not have throw rugs and other things on the floor that can make you trip. What can I do in the kitchen? Clean up any spills right away. Avoid walking on wet floors. Keep items that you use a lot in easy-to-reach places. If you need to reach something above you, use a strong step stool that has a grab bar. Keep electrical cords out of the way. Do not use floor polish or wax that makes floors slippery. If you must use wax, use non-skid floor wax. Do not have throw rugs and other things on the floor that can make you trip. What can I do with my stairs? Do not leave any items on the stairs. Make sure that there are handrails on both sides of the stairs and use them. Fix handrails that are broken or loose. Make sure that handrails are as long as the stairways. Check any carpeting to make sure  that it is firmly attached to the stairs. Fix any carpet that is loose or worn. Avoid having throw rugs at the top or bottom of the stairs. If you do have throw rugs, attach them to the floor with carpet tape. Make sure that you have a light switch at the top of the stairs and the bottom of the stairs. If you do not have them, ask someone to add them for you. What else can I do to help prevent falls? Wear shoes that: Do not have high heels. Have rubber bottoms. Are comfortable and fit you well. Are closed at the toe. Do not wear sandals. If you use a stepladder: Make sure that it is fully opened. Do not climb a closed stepladder. Make sure that  both sides of the stepladder are locked into place. Ask someone to hold it for you, if possible. Clearly mark and make sure that you can see: Any grab bars or handrails. First and last steps. Where the edge of each step is. Use tools that help you move around (mobility aids) if they are needed. These include: Canes. Walkers. Scooters. Crutches. Turn on the lights when you go into a dark area. Replace any light bulbs as soon as they burn out. Set up your furniture so you have a clear path. Avoid moving your furniture around. If any of your floors are uneven, fix them. If there are any pets around you, be aware of where they are. Review your medicines with your doctor. Some medicines can make you feel dizzy. This can increase your chance of falling. Ask your doctor what other things that you can do to help prevent falls. This information is not intended to replace advice given to you by your health care provider. Make sure you discuss any questions you have with your health care provider. Document Released: 12/13/2008 Document Revised: 07/25/2015 Document Reviewed: 03/23/2014 Elsevier Interactive Patient Education  2017 [employer].

## 2021-11-19 NOTE — Addendum Note (Signed)
Addended by: Glenna Durand E on: 11/19/2021 12:11 PM   Modules accepted: Orders

## 2021-11-19 NOTE — Progress Notes (Signed)
Subjective:   Wendy Mosley is a 71 y.o. female who presents for Medicare Annual (Subsequent) preventive examination.  Review of Systems     Cardiac Risk Factors include: diabetes mellitus;advanced age (>35mn, >>73women);dyslipidemia;hypertension     Objective:    Today's Vitals   11/19/21 1126 11/19/21 1131  BP: 120/70   Pulse: 64   Temp: 98.1 F (36.7 C)   TempSrc: Oral   SpO2: 95%   Weight: 164 lb 3.2 oz (74.5 kg)   Height: 5' 3.2" (1.605 m)   PainSc:  5    Body mass index is 28.9 kg/m.     11/19/2021   11:40 AM 09/28/2021   11:04 PM 11/07/2020   12:45 PM 10/25/2019   10:35 AM 09/15/2018   11:20 AM 05/27/2018   12:26 PM 02/14/2018   10:28 AM  Advanced Directives  Does Patient Have a Medical Advance Directive? No No No No No No No  Would patient like information on creating a medical advance directive? No - Patient declined No - Patient declined  No - Patient declined No - Patient declined Yes (MAU/Ambulatory/Procedural Areas - Information given) No - Patient declined    Current Medications (verified) Outpatient Encounter Medications as of 11/19/2021  Medication Sig   aspirin EC 81 MG tablet Take 1 tablet (81 mg total) by mouth daily. Swallow whole.   B COMPLEX-ZINC PO Take 1 tablet by mouth daily.   clobetasol cream (TEMOVATE) 0.05 % Apply to affected area twice as needed.   diclofenac Sodium (VOLTAREN) 1 % GEL Apply 2 g topically 4 (four) times daily.   empagliflozin (JARDIANCE) 10 MG TABS tablet Take 1 tablet (10 mg total) by mouth daily before breakfast.   ezetimibe (ZETIA) 10 MG tablet Take 1 tablet (10 mg total) by mouth daily.   ibuprofen (ADVIL,MOTRIN) 200 MG tablet Take 400-600 mg by mouth every 6 (six) hours as needed for moderate pain.   loratadine (CLARITIN) 10 MG tablet Take 10 mg by mouth daily as needed for allergies.   metoprolol tartrate (LOPRESSOR) 25 MG tablet Take 1 tablet (25 mg total) by mouth 2 (two) times daily.   nitroGLYCERIN (NITROSTAT)  0.4 MG SL tablet Place 1 tablet (0.4 mg total) under the tongue every 5 (five) minutes x 3 doses as needed for chest pain.   pantoprazole (PROTONIX) 20 MG tablet Take two tablets ('40mg'$ ) daily for two weeks. Then return to one tablet ('20mg'$ ) daily.   amoxicillin-clavulanate (AUGMENTIN) 500-125 MG tablet Take 1 tablet (500 mg total) by mouth 3 (three) times daily. (Patient not taking: Reported on 11/19/2021)   clotrimazole (GYNE-LOTRIMIN) 1 % vaginal cream Place 1 Applicatorful vaginally at bedtime. (Patient not taking: Reported on 11/19/2021)   No facility-administered encounter medications on file as of 11/19/2021.    Allergies (verified) Lipitor [atorvastatin] and Sulfonamide derivatives   History: Past Medical History:  Diagnosis Date   CAD in native artery 06/06/2020   CAD native.  Calcium score 176, 85th percentile 04/2020   Cataracts, bilateral    right eye - just watching   Diabetes mellitus without complication ([place]    Diverticulitis    GERD (gastroesophageal reflux disease) 2006   Hearing loss    right ear - no hearing aid   Heart attack [place] 2008   Hypertension    Irregular heart beat 1973   Palpitations 11/30/2019   SVD (spontaneous vaginal delivery)    x 3   Past Surgical History:  Procedure Laterality Date   APPENDECTOMY  1967   BREAST SURGERY     cyst removed from right breast at age 25   [place] N/A 02/21/2018   Procedure: DILATATION & CURETTAGE/HYSTEROSCOPY WITH MYOSURE RESECTION OF ENDOMETRIAL POLYP;  Surgeon: Servando Salina, MD;  Location: [facility];  Service: Gynecology;  Laterality: N/A;   EXTERNAL EAR SURGERY  1974   TUBAL LIGATION  1977   Family History  Problem Relation Age of Onset   Asthma Mother    Stroke Mother    COPD Mother    Heart failure Mother    Diabetes Sister    Cancer Maternal Grandmother        Lukemia   Breast cancer Neg Hx    Social History    Socioeconomic History   Marital status: Married    Spouse name: Not on file   Number of children: Not on file   Years of education: Not on file   Highest education level: Not on file  Occupational History   Not on file  Tobacco Use   Smoking status: Former    Packs/day: 1.00    Years: 30.00    Total pack years: 30.00    Types: Cigarettes    Quit date: 02/01/2011    Years since quitting: 10.8   Smokeless tobacco: Never  Vaping Use   Vaping Use: Never used  Substance and Sexual Activity   Alcohol use: Not Currently    Comment: occasional glass of wine   Drug use: No    Comment: No IV drug   Sexual activity: Not Currently    Birth control/protection: Post-menopausal  Other Topics Concern   Not on file  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[place]   [address] [place]   Legally Separated to Second Husband         Social Determinants of Health   Financial Resource Strain: Low Risk  (11/19/2021)   Overall Financial Resource Strain (CARDIA)    Difficulty of Paying Living Expenses: Not hard at all  Food Insecurity: Food Insecurity Present (11/19/2021)   Hunger Vital Sign    Worried About [place] in the Last Year: Sometimes true    Ran Out of Food in the Last Year: Sometimes true  Transportation Needs: No Transportation Needs (11/19/2021)   PRAPARE - Hydrologist (Medical): No    Lack of Transportation (Non-Medical): No  Physical Activity: Inactive (11/19/2021)   Exercise Vital Sign    Days of Exercise per Week: 0 days    Minutes of Exercise per Session: 0 min  Stress: No Stress Concern Present (11/19/2021)   [place]    Feeling of Stress : Not at all  Social Connections: Not on file    Tobacco Counseling Counseling given: Not Answered   Clinical Intake:  Pre-visit preparation completed: Yes  Pain : 0-10 Pain Score: 5  Pain  Type: Chronic pain, Acute pain Pain Location: Head Pain Orientation: Right Pain Descriptors / Indicators: Nagging Pain Onset: In the past 7 days Pain Frequency: Intermittent     Nutritional Status: BMI 25 -29 Overweight Nutritional Risks: None Diabetes: Yes  How often do you need to have someone help you when you read instructions, pamphlets, or other written materials from your doctor or pharmacy?: 1 - Never What is the last grade level you completed in  school?: some college  Diabetic? Yes Nutrition Risk Assessment:  Has the patient had any N/V/D within the last 2 months?  No  Does the patient have any non-healing wounds?  No  Has the patient had any unintentional weight loss or weight gain?  No   Diabetes:  Is the patient diabetic?  Yes  If diabetic, was a CBG obtained today?  No  Did the patient bring in their glucometer from home?  No  How often do you monitor your CBG's? Once month.   Financial Strains and Diabetes Management:  Are you having any financial strains with the device, your supplies or your medication? No .  Does the patient want to be seen by Chronic Care Management for management of their diabetes?  No  Would the patient like to be referred to a Nutritionist or for Diabetic Management?  No   Diabetic Exams:  Diabetic Eye Exam: Completed 09/10/2021 Diabetic Foot Exam: Overdue, Pt has been advised about the importance in completing this exam. Pt is scheduled for diabetic foot exam on next appointment.   Interpreter Needed?: No  Information entered by :: NAllen LPN   Activities of Daily Living    11/19/2021   11:42 AM  In your present state of health, do you have any difficulty performing the following activities:  Hearing? 0  Vision? 0  Difficulty concentrating or making decisions? 0  Walking or climbing stairs? 0  Dressing or bathing? 0  Doing errands, shopping? 0  Preparing Food and eating ? N  Using the Toilet? N  In the past six months,  have you accidently leaked urine? Y  Comment with laughing  Do you have problems with loss of bowel control? N  Managing your Medications? N  Managing your Finances? N  Housekeeping or managing your Housekeeping? N    Patient Care Team: [name], FNP as PCP - General ([place]) Skeet Latch, MD as PCP - Cardiology (Cardiology) Rex Kras, Claudette Stapler, RN as Case Manager  Indicate any recent Medical Services you may have received from other than [facility] providers in the past year (date may be approximate).     Assessment:   This is a routine wellness examination for Wendy Mosley.  Hearing/Vision screen Vision Screening - Comments:: Regular eye exams, Dr. Katy Fitch  Dietary issues and exercise activities discussed: Current Exercise Habits: The patient does not participate in regular exercise at present   Goals Addressed             This Visit's Progress    Patient Stated       11/19/2021, wants to get tone       Depression Screen    11/19/2021   11:41 AM 07/09/2021   10:25 AM 11/07/2020   12:46 PM 10/25/2019   10:36 AM 03/28/2019   12:14 PM 09/15/2018   11:22 AM 07/26/2018    2:24 PM  PHQ 2/9 Scores  PHQ - 2 Score 0 0 0 0 0 0 0  PHQ- 9 Score    0  0     Fall Risk    11/19/2021   11:41 AM 07/09/2021   10:25 AM 11/07/2020   12:46 PM 10/25/2019   10:35 AM 03/28/2019   12:14 PM  Fall Risk   Falls in the past year? 0 0 0 0 0  Number falls in past yr: 0      Injury with Fall? 0      Risk for fall due to : Medication side effect  Medication side effect No Fall Risks   Follow up Falls prevention discussed;Education provided;Falls evaluation completed  Falls evaluation completed;Education provided;Falls prevention discussed Falls evaluation completed;Education provided;Falls prevention discussed     FALL RISK PREVENTION PERTAINING TO THE HOME:  Any stairs in or around the home? No  If so, are there any without handrails? N/a Home free of loose throw rugs in walkways, pet  beds, electrical cords, etc? Yes  Adequate lighting in your home to reduce risk of falls? Yes   ASSISTIVE DEVICES UTILIZED TO PREVENT FALLS:  Life alert? No  Use of a cane, walker or w/c? No  Grab bars in the bathroom? No  Shower chair or bench in shower? No  Elevated toilet seat or a handicapped toilet? Yes   TIMED UP AND GO:  Was the test performed? Yes .  Length of time to ambulate 10 feet: 5 sec.   Gait steady and fast without use of assistive device  Cognitive Function:        11/19/2021   11:43 AM 11/07/2020   12:49 PM 10/25/2019   10:38 AM 09/15/2018   11:26 AM  6CIT Screen  What Year? 0 points 0 points 0 points 0 points  What month? 0 points 0 points 0 points 0 points  What time? 0 points 0 points 0 points 0 points  Count back from 20 0 points 0 points 0 points 2 points  Months in reverse 0 points 0 points 0 points 0 points  Repeat phrase 2 points 2 points 0 points 0 points  Total Score 2 points 2 points 0 points 2 points    Immunizations Immunization History  Administered Date(s) Administered   Fluad Quad(high Dose 65+) 11/19/2021   Influenza, High Dose Seasonal PF 03/15/2021   Influenza,inj,Quad PF,6+ Mos 11/12/2015, 03/29/2017   Influenza-Unspecified 03/29/2017, 02/02/2019   PFIZER(Purple Top)SARS-COV-2 Vaccination 05/16/2019, 06/06/2019, 02/15/2020   Pfizer Covid-19 Vaccine Bivalent Booster 65yr & up 03/15/2021   Pneumococcal Conjugate-13 03/29/2017, 03/29/2017   Pneumococcal Polysaccharide-23 10/25/2019   Tdap 07/02/2011   Zoster Recombinat (Shingrix) 05/22/2021    TDAP status: Due, Education has been provided regarding the importance of this vaccine. Advised may receive this vaccine at local pharmacy or Health Dept. Aware to provide a copy of the vaccination record if obtained from local pharmacy or Health Dept. Verbalized acceptance and understanding.  Flu Vaccine status: Completed at today's visit  Pneumococcal vaccine status: Up to date  Covid-19  vaccine status: Completed vaccines  Qualifies for Shingles Vaccine? Yes   Zostavax completed No   Shingrix Completed?: needs second dose  Screening Tests Health Maintenance  Topic Date Due   FOOT EXAM  Never done   Diabetic kidney evaluation - Urine ACR  10/24/2020   TETANUS/TDAP  07/01/2021   COVID-19 Vaccine (5 - Pfizer series) 07/13/2021   Zoster Vaccines- Shingrix (2 of 2) 07/17/2021   MAMMOGRAM  12/20/2021   HEMOGLOBIN A1C  03/07/2022   Diabetic kidney evaluation - GFR measurement  09/05/2022   OPHTHALMOLOGY EXAM  09/11/2022   COLONOSCOPY (Pts 45-490yrInsurance coverage will need to be confirmed)  09/11/2029   Pneumonia Vaccine 6577Years old  Completed   INFLUENZA VACCINE  Completed   DEXA SCAN  Completed   Hepatitis C Screening  Completed   HPV VACCINES  Aged Out    Health Maintenance  Health Maintenance Due  Topic Date Due   FOOT EXAM  Never done   Diabetic kidney evaluation - Urine ACR  10/24/2020   TETANUS/TDAP  07/01/2021   COVID-19 Vaccine (5 - Pfizer series) 07/13/2021   Zoster Vaccines- Shingrix (2 of 2) 07/17/2021    Colorectal cancer screening: Type of screening: Colonoscopy. Completed 09/12/2019. Repeat every 10 years  Mammogram status: Ordered today. Pt provided with contact info and advised to call to schedule appt.   Bone Density status: Completed 08/08/2020.   Lung Cancer Screening: (Low Dose CT Chest recommended if Age 55-80 years, 30 pack-year currently smoking OR have quit w/in 15years.) does not qualify.   Lung Cancer Screening Referral: no  Additional Screening:  Hepatitis C Screening: does qualify; Completed 11/12/2015  Vision Screening: Recommended annual ophthalmology exams for early detection of glaucoma and other disorders of the eye. Is the patient up to date with their annual eye exam?  Yes  Who is the provider or what is the name of the office in which the patient attends annual eye exams? Dr. Katy Fitch  If pt is not established with a  provider, would they like to be referred to a provider to establish care? No .   Dental Screening: Recommended annual dental exams for proper oral hygiene  Community Resource Referral / Chronic Care Management: CRR required this visit?  No   CCM required this visit?  No      Plan:     I have personally reviewed and noted the following in the patient's chart:   Medical and social history Use of alcohol, tobacco or illicit drugs  Current medications and supplements including opioid prescriptions. Patient is not currently taking opioid prescriptions. Functional ability and status Nutritional status Physical activity Advanced directives List of other physicians Hospitalizations, surgeries, and ER visits in previous 12 months Vitals Screenings to include cognitive, depression, and falls Referrals and appointments  In addition, I have reviewed and discussed with patient certain preventive protocols, quality metrics, and best practice recommendations. A written personalized care plan for preventive services as well as general preventive health recommendations were provided to patient.     Kellie Simmering, LPN   [id number]/31/[id number]   Nurse Notes: none

## 2021-11-20 ENCOUNTER — Telehealth: Payer: Self-pay

## 2021-11-20 NOTE — Telephone Encounter (Signed)
   Telephone encounter was:  Successful.  11/20/2021 Name: Wendy Mosley MRN: [id number] DOB: 06-20-1950  Wendy Mosley is a 71 y.o. year old female who is a primary care patient of [name], [place] . The community resource team was consulted for assistance with [place] guide performed the following interventions: Spoke with patient about food stamps, based on the income that she provided I don't think she will qualify, but she estimated and did not know the exact amount.  Verified her mailing address to mail food stamp application with income limit chart and food pantry list. Patient has my name and number if she does not receive the letter in the next 7-10 business days. Letter saved in Epic.  Follow Up Plan:  No further follow up planned at this time. The patient has been provided with needed resources.  [place] Resource Care Guide   [email or web address]  ?? [phone]   Website: [email or web address]  [email or web address]  "We don't say no, we SHOW how!"         The [facility]

## 2021-11-24 ENCOUNTER — Encounter: Payer: Self-pay | Admitting: Nurse Practitioner

## 2021-11-24 ENCOUNTER — Ambulatory Visit (INDEPENDENT_AMBULATORY_CARE_PROVIDER_SITE_OTHER): Payer: Medicare HMO | Admitting: Nurse Practitioner

## 2021-11-24 ENCOUNTER — Other Ambulatory Visit: Payer: Medicare HMO

## 2021-11-24 VITALS — BP 128/70 | HR 62 | Temp 98.0°F | Ht 63.2 in | Wt 162.0 lb

## 2021-11-24 DIAGNOSIS — I10 Essential (primary) hypertension: Secondary | ICD-10-CM | POA: Diagnosis not present

## 2021-11-24 DIAGNOSIS — E782 Mixed hyperlipidemia: Secondary | ICD-10-CM

## 2021-11-24 DIAGNOSIS — Z0000 Encounter for general adult medical examination without abnormal findings: Secondary | ICD-10-CM | POA: Diagnosis not present

## 2021-11-24 DIAGNOSIS — Z79899 Other long term (current) drug therapy: Secondary | ICD-10-CM

## 2021-11-24 DIAGNOSIS — Z23 Encounter for immunization: Secondary | ICD-10-CM | POA: Diagnosis not present

## 2021-11-24 DIAGNOSIS — E11620 Type 2 diabetes mellitus with diabetic dermatitis: Secondary | ICD-10-CM

## 2021-11-24 LAB — POCT URINALYSIS DIPSTICK
Bilirubin, UA: NEGATIVE
Blood, UA: NEGATIVE
Glucose, UA: POSITIVE — AB
Leukocytes, UA: NEGATIVE
Nitrite, UA: NEGATIVE
Protein, UA: NEGATIVE
Spec Grav, UA: 1.02 (ref 1.010–1.025)
Urobilinogen, UA: 0.2 E.U./dL
pH, UA: 5.5 (ref 5.0–8.0)

## 2021-11-24 NOTE — Progress Notes (Unsigned)
I,Tianna Badgett,acting as a Education administrator for [employer], FNP.,have documented all relevant documentation on the behalf of [name], FNP,as directed by  [name], FNP while in the presence of [name], [place].  Subjective:     Patient ID: Wendy Mosley , female    DOB: 11/14/1950 , 71 y.o.   MRN: [id number]   Chief Complaint  Patient presents with   Annual Exam    HPI  Patient presents today for HM. She has seen Cardiology. She reports vaginal itching. She was recently on antibiotics for urinary tract infection.   Wt Readings from Last 3 Encounters: 11/24/21 : 162 lb (73.5 kg) 11/19/21 : 164 lb 3.2 oz (74.5 kg) 09/28/21 : 168 lb (76.2 kg)    Diabetes She presents for her follow-up diabetic visit. She has type 2 diabetes mellitus. There are no hypoglycemic associated symptoms. Pertinent negatives for hypoglycemia include no headaches. Pertinent negatives for diabetes include no chest pain. There are no hypoglycemic complications. There are no diabetic complications. Risk factors for coronary artery disease include sedentary lifestyle. Current diabetic treatment includes diet and oral agent (monotherapy). When asked about meal planning, she reported none. She has not had a previous visit with a dietitian. She does not see a podiatrist.Eye exam is not current.  Hypertension This is a chronic problem. The current episode started more than 1 year ago. The problem is unchanged. The problem is controlled. Pertinent negatives include no anxiety, chest pain, headaches or palpitations. There are no associated agents to hypertension. Risk factors for coronary artery disease include sedentary lifestyle. Past treatments include nothing. Compliance problems include exercise and diet.  There is no history of chronic renal disease.     Past Medical History:  Diagnosis Date   CAD in native artery 06/06/2020   CAD native.  Calcium score 176, 85th percentile 04/2020   Cataracts, bilateral     right eye - just watching   Diabetes mellitus without complication (HCC)    Diverticulitis    GERD (gastroesophageal reflux disease) 2006   Hearing loss    right ear - no hearing aid   Heart attack ([place]) 2008   Hypertension    Irregular heart beat 1973   Palpitations 11/30/2019   SVD (spontaneous vaginal delivery)    x 3     Family History  Problem Relation Age of Onset   Asthma Mother    Stroke Mother    COPD Mother    Heart failure Mother    Diabetes Sister    Cancer Maternal Grandmother        Lukemia   Breast cancer Neg Hx      Current Outpatient Medications:    aspirin EC 81 MG tablet, Take 1 tablet (81 mg total) by mouth daily. Swallow whole., Disp: 90 tablet, Rfl: 3   B COMPLEX-ZINC PO, Take 1 tablet by mouth daily., Disp: , Rfl:    clobetasol cream (TEMOVATE) 0.05 %, Apply to affected area twice as needed., Disp: 30 g, Rfl: 0   diclofenac Sodium (VOLTAREN) 1 % GEL, Apply 2 g topically 4 (four) times daily., Disp: 100 g, Rfl: 3   empagliflozin (JARDIANCE) 10 MG TABS tablet, Take 1 tablet (10 mg total) by mouth daily before breakfast., Disp: 30 tablet, Rfl: 5   ezetimibe (ZETIA) 10 MG tablet, Take 1 tablet (10 mg total) by mouth daily., Disp: 90 tablet, Rfl: 3   ibuprofen (ADVIL,MOTRIN) 200 MG tablet, Take 400-600 mg by mouth every 6 (six) hours as needed  for moderate pain., Disp: , Rfl:    loratadine (CLARITIN) 10 MG tablet, Take 10 mg by mouth daily as needed for allergies., Disp: , Rfl:    metoprolol tartrate (LOPRESSOR) 25 MG tablet, Take 1 tablet (25 mg total) by mouth 2 (two) times daily., Disp: 180 tablet, Rfl: 3   nitroGLYCERIN (NITROSTAT) 0.4 MG SL tablet, Place 1 tablet (0.4 mg total) under the tongue every 5 (five) minutes x 3 doses as needed for chest pain., Disp: 10 tablet, Rfl: 0   pantoprazole (PROTONIX) 20 MG tablet, Take two tablets (50m) daily for two weeks. Then return to one tablet (254m daily., Disp: 90 tablet, Rfl: 1   conjugated estrogens  (PREMARIN) vaginal cream, Place 1 Applicatorful vaginally daily., Disp: 42.5 g, Rfl: 6   Allergies  Allergen Reactions   Lipitor [Atorvastatin] Other (See Comments)    Hair loss   Sulfonamide Derivatives     Rash/itching       The patient states she is post menopausal status.  No LMP recorded. Patient is postmenopausal.. Negative for Dysmenorrhea and Negative for Menorrhagia. Negative for: breast discharge, breast lump(s), breast pain and breast self exam. Associated symptoms include abnormal vaginal bleeding. Pertinent negatives include abnormal bleeding (hematology), anxiety, decreased libido, depression, difficulty falling sleep, dyspareunia, history of infertility, nocturia, sexual dysfunction, sleep disturbances, urinary incontinence, urinary urgency, vaginal discharge and vaginal itching. Diet regular.The patient states her exercise level is minimal   . The patient's tobacco use is:  Social History   Tobacco Use  Smoking Status Former   Packs/day: 1.00   Years: 30.00   Total pack years: 30.00   Types: Cigarettes   Quit date: 02/01/2011   Years since quitting: 10.8  Smokeless Tobacco Never  . She has been exposed to passive smoke. The patient's alcohol use is:  Social History   Substance and Sexual Activity  Alcohol Use Not Currently   Comment: occasional glass of wine    Review of Systems  Constitutional: Negative.   HENT: Negative.    Eyes: Negative.   Respiratory: Negative.    Cardiovascular: Negative.  Negative for chest pain and palpitations.  Gastrointestinal: Negative.   Endocrine: Negative.   Genitourinary: Negative.   Musculoskeletal: Negative.   Skin: Negative.   Allergic/Immunologic: Negative.   Neurological: Negative.  Negative for headaches.  Hematological: Negative.   Psychiatric/Behavioral: Negative.       Today's Vitals   11/24/21 1025  BP: 128/70  Pulse: 62  Temp: 98 F (36.7 C)  TempSrc: Oral  Weight: 162 lb (73.5 kg)  Height: 5' 3.2"  (1.605 m)   Body mass index is 28.52 kg/m.   Objective:  Physical Exam Vitals reviewed.  Constitutional:      General: She is not in acute distress.    Appearance: Normal appearance. She is well-developed. She is obese.  HENT:     Head: Normocephalic and atraumatic.     Right Ear: Hearing, tympanic membrane, ear canal and external ear normal. There is no impacted cerumen.     Left Ear: Hearing, tympanic membrane, ear canal and external ear normal. There is no impacted cerumen.     Nose:     Comments: Deferred - masked    Mouth/Throat:     Comments: Deferred - masked Eyes:     General: Lids are normal.     Extraocular Movements: Extraocular movements intact.     Conjunctiva/sclera: Conjunctivae normal.     Pupils: Pupils are equal, round, and reactive to light.  Funduscopic exam:    Right eye: No papilledema.        Left eye: No papilledema.  Neck:     Thyroid: No thyroid mass.     Vascular: No carotid bruit.  Cardiovascular:     Rate and Rhythm: Normal rate and regular rhythm.     Pulses: Normal pulses.     Heart sounds: Normal heart sounds. No murmur heard. Pulmonary:     Effort: Pulmonary effort is normal.     Breath sounds: Normal breath sounds.  Chest:     Chest wall: No mass.  Breasts:    Tanner Score is 5.     Right: Normal. No tenderness.     Left: Normal. No tenderness.  Abdominal:     General: Abdomen is flat. Bowel sounds are normal. There is no distension.     Palpations: Abdomen is soft.     Tenderness: There is no abdominal tenderness.  Musculoskeletal:        General: No swelling. Normal range of motion.     Cervical back: Full passive range of motion without pain, normal range of motion and neck supple.     Right lower leg: No edema.     Left lower leg: No edema.  Lymphadenopathy:     Upper Body:     Right upper body: No supraclavicular, axillary or pectoral adenopathy.     Left upper body: No supraclavicular, axillary or pectoral adenopathy.   Skin:    General: Skin is warm and dry.     Capillary Refill: Capillary refill takes less than 2 seconds.  Neurological:     General: No focal deficit present.     Mental Status: She is alert and oriented to person, place, and time.     Cranial Nerves: No cranial nerve deficit.     Sensory: No sensory deficit.     Motor: No weakness.  Psychiatric:        Mood and Affect: Mood normal.        Behavior: Behavior normal.        Thought Content: Thought content normal.        Judgment: Judgment normal.         Assessment And Plan:     1. Encounter for annual physical exam Behavior modifications discussed and diet history reviewed.   Pt will continue to exercise regularly and modify diet with low GI, plant based foods and decrease intake of processed foods.  Recommend intake of daily multivitamin, Vitamin D, and calcium.  Recommend mammogram and colonoscopy for preventive screenings, as well as recommend immunizations that include influenza, TDAP, and Shingles  2. Need for Tdap vaccination Will give tetanus vaccine today while in office. Refer to order management. TDAP will be administered to adults 68-59 years old every 10 years. - Tdap vaccine greater than or equal to 7yo IM  3. Essential hypertension Comments: Blood pressure is well controlled, continue current medications  4. Mixed hyperlipidemia Comments: Continue medications as directed and limit intake of high fat foods - CMP14+EGFR - Lipid panel  5. Type 2 diabetes mellitus with diabetic dermatitis, without long-term current use of insulin (HCC) Comments: She is on Jardiance now started by Cardiology, stopped metformin was causing side effects. - POCT Urinalysis Dipstick (81002) - Microalbumin / Creatinine Urine Ratio - Hemoglobin A1c  6. Other long term (current) drug therapy - CBC   Patient was given opportunity to ask questions. Patient verbalized understanding of the plan and was able to  repeat key elements of  the plan. All questions were answered to their satisfaction.   [name], FNP   I, [name], FNP, have reviewed all documentation for this visit. The documentation on 11/24/21 for the exam, diagnosis, procedures, and orders are all accurate and complete.   THE PATIENT IS ENCOURAGED TO PRACTICE SOCIAL DISTANCING DUE TO THE COVID-19 PANDEMIC.

## 2021-11-24 NOTE — Patient Instructions (Signed)

## 2021-11-25 ENCOUNTER — Ambulatory Visit: Payer: Self-pay

## 2021-11-25 LAB — CMP14+EGFR
ALT: 16 IU/L (ref 0–32)
AST: 23 IU/L (ref 0–40)
Albumin/Globulin Ratio: 1.5 (ref 1.2–2.2)
Albumin: 4.4 g/dL (ref 3.8–4.8)
Alkaline Phosphatase: 149 IU/L — ABNORMAL HIGH (ref 44–121)
BUN/Creatinine Ratio: 9 — ABNORMAL LOW (ref 12–28)
BUN: 10 mg/dL (ref 8–27)
Bilirubin Total: 0.6 mg/dL (ref 0.0–1.2)
CO2: 22 mmol/L (ref 20–29)
Calcium: 10.1 mg/dL (ref 8.7–10.3)
Chloride: 101 mmol/L (ref 96–106)
Creatinine, Ser: 1.12 mg/dL — ABNORMAL HIGH (ref 0.57–1.00)
Globulin, Total: 3 g/dL (ref 1.5–4.5)
Glucose: 121 mg/dL — ABNORMAL HIGH (ref 70–99)
Potassium: 4.4 mmol/L (ref 3.5–5.2)
Sodium: 139 mmol/L (ref 134–144)
Total Protein: 7.4 g/dL (ref 6.0–8.5)
eGFR: 53 mL/min/{1.73_m2} — ABNORMAL LOW (ref >59)

## 2021-11-25 LAB — CBC
Hematocrit: 43.4 % (ref 34.0–46.6)
Hemoglobin: 13.4 g/dL (ref 11.1–15.9)
MCH: 22.6 pg — ABNORMAL LOW (ref 26.6–33.0)
MCHC: 30.9 g/dL — ABNORMAL LOW (ref 31.5–35.7)
MCV: 73 fL — ABNORMAL LOW (ref 79–97)
Platelets: 373 10*3/uL (ref 150–450)
RBC: 5.93 x10E6/uL — ABNORMAL HIGH (ref 3.77–5.28)
RDW: 17 % — ABNORMAL HIGH (ref 11.7–15.4)
WBC: 5.7 10*3/uL (ref 3.4–10.8)

## 2021-11-25 LAB — MICROALBUMIN / CREATININE URINE RATIO
Creatinine, Urine: 118.9 mg/dL
Microalb/Creat Ratio: 12 mg/g creat (ref 0–29)
Microalbumin, Urine: 14.1 ug/mL

## 2021-11-25 LAB — LIPID PANEL
Chol/HDL Ratio: 4.6 ratio — ABNORMAL HIGH (ref 0.0–4.4)
Cholesterol, Total: 190 mg/dL (ref 100–199)
HDL: 41 mg/dL (ref >39)
LDL Chol Calc (NIH): 124 mg/dL — ABNORMAL HIGH (ref 0–99)
Triglycerides: 137 mg/dL (ref 0–149)
VLDL Cholesterol Cal: 25 mg/dL (ref 5–40)

## 2021-11-25 LAB — HEMOGLOBIN A1C
Est. average glucose Bld gHb Est-mCnc: 134 mg/dL
Hgb A1c MFr Bld: 6.3 % — ABNORMAL HIGH (ref 4.8–5.6)

## 2021-11-25 NOTE — Patient Outreach (Signed)
  Care Coordination   11/25/2021 Name: Wendy Mosley MRN: [id number] DOB: Apr 28, 1950   Care Coordination Outreach Attempts:  An unsuccessful telephone outreach was attempted for a scheduled appointment today.  Follow Up Plan:  Additional outreach attempts will be made to offer the patient care coordination information and services.   Encounter Outcome:  No Answer  Care Coordination Interventions Activated:  No   Care Coordination Interventions:  No, not indicated    Barb Merino, RN, BSN, CCM Care Management Coordinator [facility] Direct Phone: [phone]

## 2021-11-26 ENCOUNTER — Other Ambulatory Visit: Payer: Self-pay | Admitting: Nurse Practitioner

## 2021-11-26 DIAGNOSIS — E11620 Type 2 diabetes mellitus with diabetic dermatitis: Secondary | ICD-10-CM

## 2021-11-26 DIAGNOSIS — N898 Other specified noninflammatory disorders of vagina: Secondary | ICD-10-CM

## 2021-11-26 DIAGNOSIS — Z780 Asymptomatic menopausal state: Secondary | ICD-10-CM

## 2021-11-26 MED ORDER — PREMARIN 0.625 MG/GM VA CREA: 1.0000 | TOPICAL_CREAM | Freq: Every day | VAGINAL | 6 refills | Status: DC

## 2021-12-02 ENCOUNTER — Telehealth: Payer: Self-pay

## 2021-12-02 NOTE — Telephone Encounter (Signed)
Call to pt reference PREP referral Mailbox was full  Sent text message requesting call back to discuss PREP

## 2021-12-03 DIAGNOSIS — K219 Gastro-esophageal reflux disease without esophagitis: Secondary | ICD-10-CM | POA: Diagnosis not present

## 2021-12-03 DIAGNOSIS — K5900 Constipation, unspecified: Secondary | ICD-10-CM | POA: Diagnosis not present

## 2021-12-03 DIAGNOSIS — R1032 Left lower quadrant pain: Secondary | ICD-10-CM | POA: Diagnosis not present

## 2021-12-05 ENCOUNTER — Telehealth: Payer: Self-pay | Admitting: Cardiovascular Disease

## 2021-12-05 NOTE — Telephone Encounter (Signed)
Pt states she dropped her DOT forms at the office and needs to come pick them up.

## 2021-12-05 NOTE — Telephone Encounter (Signed)
Do you have DOT forms on this patient?

## 2021-12-05 NOTE — Telephone Encounter (Signed)
Did not reach out to patient

## 2021-12-05 NOTE — Telephone Encounter (Signed)
Patient states she is returning an additional call. Contacted sleep coordinator to see if she called, but no response as of right now.

## 2021-12-05 NOTE — Telephone Encounter (Signed)
Spoke with patient and advised received Cardiac Condition check list and faxed to number listed but would fax again.   She was asking about sleep study papers that needed to be filled out that were dropped off Advised patient that Dr Oval Linsey did not receive any sleep papers and the may have been sent to another office where her sleep MD worked Advised patient would forward to sleep pool and ask them to follow up Patient would like a call back if no papers received as soon as possible since all of the paperwork needs to be submitted by end of month.  She also will need to know which office sleep papers need to be dropped off at

## 2021-12-08 NOTE — Telephone Encounter (Signed)
I know nothing about any DOT paperwork. I did not call this patient.

## 2021-12-10 ENCOUNTER — Ambulatory Visit (HOSPITAL_BASED_OUTPATIENT_CLINIC_OR_DEPARTMENT_OTHER): Payer: Medicare HMO | Admitting: Cardiovascular Disease

## 2021-12-12 ENCOUNTER — Ambulatory Visit: Payer: Self-pay

## 2021-12-12 NOTE — Patient Outreach (Signed)
  Care Coordination   12/12/2021 Name: Wendy Mosley MRN: [id number] DOB: 1950/09/05   Care Coordination Outreach Attempts:  An unsuccessful telephone outreach was attempted for a scheduled appointment today.  Follow Up Plan:  Additional outreach attempts will be made to offer the patient care coordination information and services.   Encounter Outcome:  No Answer  Care Coordination Interventions Activated:  No   Care Coordination Interventions:  No, not indicated    Barb Merino, RN, BSN, CCM Care Management Coordinator [facility] Direct Phone: [phone]

## 2021-12-19 ENCOUNTER — Telehealth: Payer: Self-pay | Admitting: *Deleted

## 2021-12-19 NOTE — Chronic Care Management (AMB) (Signed)
  Care Coordination Note  12/19/2021 Name: HAILIE SEARIGHT MRN: [id number] DOB: [birth year]  KAROLYNE TIMMONS is a 71 y.o. year old female who is a primary care patient of [name], [place] and is actively engaged with the care management team. I reached out to [name] by phone today to assist with re-scheduling a follow up visit with the RN Case Manager  Follow up plan: Unsuccessful telephone outreach attempt made.   [place]  Direct Dial: [phone]

## 2021-12-22 NOTE — Chronic Care Management (AMB) (Signed)
  Care Coordination Note  12/22/2021 Name: Wendy Mosley MRN: [id number] DOB: [birth year]  Wendy Mosley is a 71 y.o. year old female who is a primary care patient of [name], [place] and is actively engaged with the care management team. I reached out to [name] by phone today to assist with re-scheduling a follow up visit with the RN Case Manager  Follow up plan: Unsuccessful telephone outreach attempt made.    [place]  Direct Dial: [phone]

## 2021-12-29 ENCOUNTER — Telehealth: Payer: Self-pay | Admitting: Cardiovascular Disease

## 2021-12-29 NOTE — Telephone Encounter (Signed)
Patient is requesting a copy of her medication list.

## 2021-12-29 NOTE — Telephone Encounter (Signed)
Mailed as requested.

## 2021-12-29 NOTE — Telephone Encounter (Signed)
Called back to say she need a copy of her sleep study as well. Please advise

## 2021-12-30 ENCOUNTER — Telehealth (HOSPITAL_BASED_OUTPATIENT_CLINIC_OR_DEPARTMENT_OTHER): Payer: Self-pay | Admitting: *Deleted

## 2021-12-30 NOTE — Telephone Encounter (Signed)
Patient came into office today asking about sleep information that needed to be submitted showing progression or sleep results which needs to be submitted before the end of the month.   Advised patient that Dr Oval Linsey did not receive any sleep papers and the may have been sent to another office where her sleep MD worked  Advised patient would forward to sleep pool and ask them to reach out to patient to see exactly what exactly she is needing   Patient was leaving office to go to office requiring information for DOT to discuss with them as well

## 2021-12-30 NOTE — Telephone Encounter (Signed)
December 30, 2021 Freada Bergeron, CMA to Me      12/30/21  4:13 PM  Called her. Thanks

## 2021-12-31 NOTE — Chronic Care Management (AMB) (Signed)
  Care Coordination  Outreach Note  12/31/2021 Name: Wendy Mosley MRN: [id number] DOB: 08/19/1950   Care Coordination Outreach Attempts: A third unsuccessful outreach was attempted today to reschedule the patient for care coordination services as a benefit of their health plan.   Follow Up Plan:  No further outreach attempts will be made at this time. We have been unable to contact the patient to offer or enroll patient in care coordination services  Encounter Outcome:  No Answer  [place]: [phone]

## 2022-01-02 ENCOUNTER — Encounter (HOSPITAL_BASED_OUTPATIENT_CLINIC_OR_DEPARTMENT_OTHER): Payer: Self-pay | Admitting: Family

## 2022-01-02 ENCOUNTER — Ambulatory Visit (HOSPITAL_BASED_OUTPATIENT_CLINIC_OR_DEPARTMENT_OTHER): Payer: Medicare HMO | Admitting: Family

## 2022-01-02 ENCOUNTER — Telehealth: Payer: Self-pay | Admitting: *Deleted

## 2022-01-02 VITALS — BP 132/82 | HR 63 | Ht 63.2 in | Wt 169.0 lb

## 2022-01-02 DIAGNOSIS — E1165 Type 2 diabetes mellitus with hyperglycemia: Secondary | ICD-10-CM | POA: Diagnosis not present

## 2022-01-02 DIAGNOSIS — E785 Hyperlipidemia, unspecified: Secondary | ICD-10-CM | POA: Diagnosis not present

## 2022-01-02 DIAGNOSIS — I5022 Chronic systolic (congestive) heart failure: Secondary | ICD-10-CM | POA: Diagnosis not present

## 2022-01-02 DIAGNOSIS — B379 Candidiasis, unspecified: Secondary | ICD-10-CM | POA: Diagnosis not present

## 2022-01-02 DIAGNOSIS — G4733 Obstructive sleep apnea (adult) (pediatric): Secondary | ICD-10-CM | POA: Diagnosis not present

## 2022-01-02 DIAGNOSIS — I25118 Atherosclerotic heart disease of native coronary artery with other forms of angina pectoris: Secondary | ICD-10-CM | POA: Diagnosis not present

## 2022-01-02 DIAGNOSIS — I471 Supraventricular tachycardia, unspecified: Secondary | ICD-10-CM | POA: Diagnosis not present

## 2022-01-02 MED ORDER — FLUCONAZOLE 150 MG PO TABS: 150.0000 mg | ORAL_TABLET | Freq: Every day | ORAL | 0 refills | Status: DC

## 2022-01-02 NOTE — Progress Notes (Unsigned)
Office Visit    Patient Name: Wendy Mosley Date of Encounter: 01/02/2022  PCP:  [name], [facility]  Cardiologist:  Skeet Latch, MD  Advanced Practice Provider:  No care team member to display Electrophysiologist:  None     Chief Complaint    Wendy Mosley is a 71 y.o. female with a hx of mild nonobstructive coronary disease, SVT, prediabetes, hypertension, hyperlipidemia, prior tobacco use, HFmrEF, palpitations presents today for BP follow up  Past Medical History    Past Medical History:  Diagnosis Date   CAD in native artery 06/06/2020   CAD native.  Calcium score 176, 85th percentile 04/2020   Cataracts, bilateral    right eye - just watching   Diabetes mellitus without complication ([place])    Diverticulitis    GERD (gastroesophageal reflux disease) 2006   Hearing loss    right ear - no hearing aid   Heart attack ([place]) 2008   Hypertension    Irregular heart beat 1973   Palpitations 11/30/2019   SVD (spontaneous vaginal delivery)    x 3   Past Surgical History:  Procedure Laterality Date   APPENDECTOMY  1967   BREAST SURGERY     cyst removed from right breast at age 53   [place] N/A 02/21/2018   Procedure: DILATATION & CURETTAGE/HYSTEROSCOPY WITH [place];  Surgeon: Servando Salina, MD;  Location: [facility];  Service: Gynecology;  Laterality: N/A;   EXTERNAL EAR SURGERY  1974   TUBAL LIGATION  1977   Allergies  Allergies  Allergen Reactions   Lipitor [Atorvastatin] Other (See Comments)    Hair loss   Sulfonamide Derivatives     Rash/itching     History of Present Illness    Wendy Mosley is a 71 y.o. female with a hx of mild nonobstructive coronary disease, SVT, HFmrEF, prediabetes, hypertension, hyperlipidemia, prior tobacco use, palpitations last seen 09/04/21.  Established with Dr. Oval Linsey  September 2021 due to palpitations.  EKG revealed sinus rhythm with no arrhythmia.  Thyroid function, CMP, CBC unremarkable.  Her blood pressure and cholesterol are noted to be poorly controlled but she was hesitant regarding medications.  Coronary calcium score 176 placing her in the 85th percentile 04/2020.  When last seen 06/06/2020 she noted understanding of play grieving the loss of her daughter January 2022 from cancer.  She was recommended to continue atorvastatin for secondary prevention of coronary disease.  She was seen in the ED 06/15/2021 after presenting with generalized weakness and chest pain.  CT head no acute findings.  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  There is also periventricular and subcortical T2 hyperintensities bilaterally likely reflect sequela of chronic microvascular ischemia.  Echo 06/16/21 EF 60-63%, grade 1 diastolic dysfunction, no significant valvular abnormalities.  Cardiac CTA 06/16/2021 with coronary calcium score to 38 placing her in the 87th percentile for age and sex matched control with mild nonobstructive coronary disease.  Due to hair loss on atorvastatin and was discharged on rosuvastatin.  At follow up 07/03/21 noted snoring, elevated BP. Losartan initiated. Sleep study ordered and found OSA. Seen 08/2021 noting indigestion and stomach upset. Metformin transitioned to [place].   She presents today for follow-up.  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  Tells me stopped Jardiance due to  yeast infection, unclear when. She has been reading the side effects of Metoprolol and is concerned. She inquires whether natural methods would be enough to manage her heart failure and CAD. She is concerned about CPAP as her daughter wore it and developed fluid around her lungs, we reviewed this was due to her daughter's stage 4 lung cancer. Notes daytime somnolence and tells me she is agreeable to trial  CPAP.   EKGs/Labs/Other Studies Reviewed:   The following studies were reviewed today:  Coronary calcium score 04/18/2020 IMPRESSION: Coronary calcium score of 176. This was 85th percentile for age and sex matched control.   Recommend aggressive risk factor modification, including LDL goal <70.   Echo 06/16/2021   1. Left ventricular ejection fraction, by estimation, is 45 to 50%. The  left ventricle has mildly decreased function. The left ventricle  demonstrates global hypokinesis. Left ventricular diastolic parameters are  consistent with Grade I diastolic  dysfunction (impaired relaxation).   2. Right ventricular systolic function is normal. The right ventricular  size is normal. Tricuspid regurgitation signal is inadequate for assessing  PA pressure.   3. The mitral valve is normal in structure. Trivial mitral valve  regurgitation. No evidence of mitral stenosis.   4. The aortic valve is tricuspid. Aortic valve regurgitation is not  visualized. No aortic stenosis is present.   5. The inferior vena cava is normal in size with greater than 50%  respiratory variability, suggesting right atrial pressure of 3 mmHg.   Coronary CTA 06/16/2021 FINDINGS: Coronary calcium score: The patient's coronary artery calcium score is 238, which places the patient in the 87 percentile.   Coronary arteries: Normal coronary origins.  Left dominance.   Right Coronary Artery: Small caliber vessel. Trivial mixed calcified and noncalcified plaque with 1-24% stenosis maximum.   Left Main Coronary Artery: Normal caliber vessel. Mixed calcified and noncalcified plaque with 1-24% stenosis.   Left Anterior Descending Coronary Artery: Normal caliber vessel. Mixed calcified and noncalcified plaque in proximal LAD and portions of mid LAD. Maximum 1-24% stenosis. Gives rise to one large diagonal branch.   Left Circumflex Artery: Normal caliber vessel, gives rise to PDA. Mixed calcified and  noncalcified plaque in proximal portion, with a focal calcified plaque at the origin of OM with 25-49% stenosis. Gives rise to one large OM branch.   Aorta: Normal size, 28 mm at the mid ascending aorta (level of the PA bifurcation) measured double oblique. Aortic atherosclerosis, with likely mural thrombus in abdominal aorta. No dissection seen in visualized portions of the aorta.   Aortic Valve: No calcifications. Trileaflet.   Other findings:   Normal pulmonary vein drainage into the left atrium.   Normal left atrial appendage without a thrombus.   Normal size of the pulmonary artery.   Normal appearance of the pericardium.   IMPRESSION: 1. Minimal nonobstructive CAD, CADRADS = 2.   2. Coronary calcium score of 238. This was 87th percentile for age and sex matched control.   3. Normal coronary origin with left dominance.   4. Aortic atherosclerosis, with likely mural thrombus in abdominal aorta   EKG: No EKG today  Recent Labs: 06/15/2021: B Natriuretic Peptide 163.8; TSH 1.538 06/16/2021: Magnesium 1.9 11/24/2021: ALT 16; BUN 10; Creatinine, Ser 1.12; Hemoglobin 13.4; Platelets 373; Potassium 4.4; Sodium 139  Recent Lipid Panel    Component Value Date/Time   CHOL 190 11/24/2021 1152   TRIG 137 11/24/2021 1152   HDL 41 11/24/2021 1152   CHOLHDL 4.6 (H) 11/24/2021  1152   CHOLHDL 6.0 06/15/2021 1717   VLDL 15 06/15/2021 1717   LDLCALC 124 (H) 11/24/2021 1152   LDLDIRECT 138 (H) 07/02/2011 1521    Home Medications   Current Meds  Medication Sig   aspirin EC 81 MG tablet Take 1 tablet (81 mg total) by mouth daily. Swallow whole.   B COMPLEX-ZINC PO Take 1 tablet by mouth daily.   clobetasol cream (TEMOVATE) 0.05 % Apply to affected area twice as needed.   conjugated estrogens (PREMARIN) vaginal cream Place 1 Applicatorful vaginally daily.   diclofenac Sodium (VOLTAREN) 1 % GEL Apply 2 g topically 4 (four) times daily.   empagliflozin (JARDIANCE) 10 MG TABS  tablet Take 1 tablet (10 mg total) by mouth daily before breakfast.   ezetimibe (ZETIA) 10 MG tablet Take 1 tablet (10 mg total) by mouth daily.   ibuprofen (ADVIL,MOTRIN) 200 MG tablet Take 400-600 mg by mouth every 6 (six) hours as needed for moderate pain.   loratadine (CLARITIN) 10 MG tablet Take 10 mg by mouth daily as needed for allergies.   metoprolol tartrate (LOPRESSOR) 25 MG tablet Take 1 tablet (25 mg total) by mouth 2 (two) times daily.   nitroGLYCERIN (NITROSTAT) 0.4 MG SL tablet Place 1 tablet (0.4 mg total) under the tongue every 5 (five) minutes x 3 doses as needed for chest pain.   pantoprazole (PROTONIX) 20 MG tablet Take two tablets ('40mg'$ ) daily for two weeks. Then return to one tablet ('20mg'$ ) daily.     Review of Systems      All other systems reviewed and are otherwise negative except as noted above.  Physical Exam    VS:  BP 132/82   Pulse 63   Ht 5' 3.2" (1.605 m)   Wt 169 lb (76.7 kg)   BMI 29.75 kg/m  , BMI Body mass index is 29.75 kg/m.  Wt Readings from Last 3 Encounters:  01/02/22 169 lb (76.7 kg)  11/24/21 162 lb (73.5 kg)  11/19/21 164 lb 3.2 oz (74.5 kg)    GEN: Well nourished, well developed, in no acute distress. HEENT: normal. Neck: Supple, no JVD, carotid bruits, or masses. Cardiac: RRR, no murmurs, rubs, or gallops. No clubbing, cyanosis, edema.  Radials/PT 2+ and equal bilaterally.  Respiratory:  Respirations regular and unlabored, clear to auscultation bilaterally. GI: Soft, nontender, nondistended. MS: No deformity or atrophy. Skin: Warm and dry, no rash. Neuro:  Strength and sensation are intact. Psych: Normal affect.  Assessment & Plan    Mild nonobstructive CAD -cardiac CTA 05/2021 with mild nonobstructive coronary disease.  GDMT includes aspirin,  zetia.  Intolerant of statin-detailed below.  Reports no anginal symptoms.  Heart healthy diet and regular cardiovascular exercise encouraged.    HFmrEF -Echo 05/2021 LVEF 45 to 50%.   Nonischemic as cardiac CTA 06/2018 revealed mild nonobstructive coronary disease.  06/15/2021 TSH 1.53. Known OSA.  Reviewed low-sodium, heart healthy diet and 150 minutes of exercise weekly recommendation.  She did not tolerate Jardiance with yeast infection. Losartan stopped, unclear when or by whom. She is very hesitant regarding medications and attributes her low dose Metoprolol to her not feeling - we will trial off Metoprolol. Suspect her symptoms are more related to untreated OSA, HFmrEF. Update echo to reassess LVEF.   SVT -quiescent on metoprolol 25 mg twice daily. However, she requests to trial off Metoprolol so will discontinue today.   HTN - BP mildly elevated. Losartan discontinued, unclear why or by whom. She is uninterested in resuming.  Discussed to monitor BP at home at least 2 hours after medications and sitting for 5-10 minutes.   HLD, LDL goal <70 - 06/15/21 total cholesterol 201, HDL 32, LDL 146, triglycerides 74.  Intolerant of atorvastatin and hesitant to try alternate statin.  She is agreeable to continue Zetia 10 mg daily. Referred to our pharmacist for lipid lowering therapy but they were unable to reach her to schedule appt and letter mailed. She is agreeable to consider meeting with them. Updated referral placed.   DM2/Yeast infection - Continue to follow with PCP. Reports s/e with both Metformin and Jardiance. Rx for diflucan given for yeast infection today.   OSA - Sleep study with severe OSA O2 as low as 75%. She completed in lab sleep study and is coordinating with our sleep team to obtain CPAP. Lengthy discussion regarding the importance of CPAP.    Disposition: Follow up in 3 mos with Skeet Latch, MD or APP.  Signed, Loel Dubonnet, NP 01/02/2022, 3:40 PM [place]

## 2022-01-02 NOTE — Telephone Encounter (Signed)
Patient seen by Hoy Morn today and notified us she had not received her cpap device. Called [facility] and per Darryl ordered received but patient insurance was out of network. I was not previously notified of this. Order has been resent to [facility] via [employer] portal. Patient notified.

## 2022-01-02 NOTE — Patient Instructions (Addendum)
Medication Instructions:  Your physician has recommended you make the following change in your medication:   STOP Metoprolol  STOP Jardiance  *If you need a refill on your cardiac medications before your next appointment, please call your pharmacy*   Lab Work/Testing/Procedures:  Your physician has requested that you have an echocardiogram. Echocardiography is a painless test that uses sound waves to create images of your heart. It provides your doctor with information about the size and shape of your heart and how well your heart's chambers and valves are working. This procedure takes approximately one hour. There are no restrictions for this procedure. Please do NOT wear cologne, perfume, aftershave, or lotions (deodorant is allowed). Please arrive 15 minutes prior to your appointment time.    Follow-Up: At [facility], you and your health needs are our priority.  As part of our continuing mission to provide you with exceptional heart care, we have created designated Provider Care Teams.  These Care Teams include your primary Cardiologist (physician) and Advanced Practice Providers (APPs -  Physician Assistants and Nurse Practitioners) who all work together to provide you with the care you need, when you need it.  We recommend signing up for the patient portal called "MyChart".  Sign up information is provided on this After Visit Summary.  MyChart is used to connect with patients for Virtual Visits (Telemedicine).  Patients are able to view lab/test results, encounter notes, upcoming appointments, etc.  Non-urgent messages can be sent to your provider as well.   To learn more about what you can do with MyChart, go to [email or web address].    Your next appointment:   3-4 month(s)  The format for your next appointment:   In Person  Provider:   Skeet Latch, MD    Other Instructions  We have referred you to our Lipid Clinic to discuss cholesterol lowering.   Please  call [facility] [phone] to get your CPAP.   Heart Healthy Diet Recommendations: A low-salt diet is recommended. Meats should be grilled, baked, or boiled. Avoid fried foods. Focus on lean protein sources like fish or chicken with vegetables and fruits. The American Heart Association is a Microbiologist!  American Heart Association Diet and Lifeystyle Recommendations   Exercise recommendations: The American Heart Association recommends 150 minutes of moderate intensity exercise weekly. Try 30 minutes of moderate intensity exercise 4-5 times per week. This could include walking, jogging, or swimming.  Important Information About Sugar

## 2022-01-03 ENCOUNTER — Encounter (HOSPITAL_BASED_OUTPATIENT_CLINIC_OR_DEPARTMENT_OTHER): Payer: Self-pay | Admitting: Family

## 2022-01-13 ENCOUNTER — Ambulatory Visit (HOSPITAL_BASED_OUTPATIENT_CLINIC_OR_DEPARTMENT_OTHER): Payer: Medicare HMO | Admitting: Family

## 2022-01-21 ENCOUNTER — Ambulatory Visit (INDEPENDENT_AMBULATORY_CARE_PROVIDER_SITE_OTHER): Payer: Medicare HMO

## 2022-01-21 DIAGNOSIS — I5022 Chronic systolic (congestive) heart failure: Secondary | ICD-10-CM | POA: Diagnosis not present

## 2022-01-21 LAB — ECHOCARDIOGRAM COMPLETE
Area-P 1/2: 3.99 cm2
S' Lateral: 2.45 cm

## 2022-01-28 ENCOUNTER — Telehealth: Payer: Self-pay | Admitting: Family

## 2022-01-28 NOTE — Telephone Encounter (Signed)
Spoke with patient regarding sleep study  She does not think the study was accurate because she was cold and did not sleep well at all that night  Wants study redone, advised insurance would likely not cover. Is changing insurance next year Patient stated she called [facility] to follow up with machine and was told she needed appointment but could not get appointment at this time.   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 Needs to be done by 12/1  Will forward to [place] Mam NP who last saw patient

## 2022-01-28 NOTE — Telephone Encounter (Signed)
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 Pt states she could potentially lose her job because this process has been prolonged. Please advise.

## 2022-01-29 ENCOUNTER — Encounter (HOSPITAL_BASED_OUTPATIENT_CLINIC_OR_DEPARTMENT_OTHER): Payer: Self-pay | Admitting: Family

## 2022-01-29 NOTE — Telephone Encounter (Signed)
Her CPAP order had to be transitioned to a new DME company due to insurance coverage. She is now with [facility] they can be reached at [phone] to check the status of her order. She was previously informed of this by sleep study team.   Her oxygen level dropped as low as 75% during her sleep study which would not be due to being cold. Once she starts CPAP she will have a follow up with Dr. Radford Pax who will manage her OSA. Repeat sleep study unlikely to change recommendation and insurance will not approve at this time. She can discuss with Dr. Radford Pax. If she wishes to schedule sooner sleep follow up to discuss, can send request to Dr. 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 Will re-write letter and we can leave up front for her to pick up.   Loel Dubonnet, NP

## 2022-01-29 NOTE — Telephone Encounter (Signed)
Pt calling back for update  

## 2022-01-29 NOTE — Telephone Encounter (Signed)
Spoke with patient who stated she was scheduled 12/8 with [facility] for CPAP  Advised patient about letter Urban Gibson typed Faxed to Dr Drema Pry and placed copy at front desk for patient to pick up

## 2022-02-02 ENCOUNTER — Ambulatory Visit
Admission: RE | Admit: 2022-02-02 | Discharge: 2022-02-02 | Disposition: A | Discharge status: Home / Self Care | Payer: Medicare HMO | Source: Ambulatory Visit | Attending: Nurse Practitioner | Admitting: Nurse Practitioner

## 2022-02-02 DIAGNOSIS — Z1231 Encounter for screening mammogram for malignant neoplasm of breast: Secondary | ICD-10-CM | POA: Diagnosis not present

## 2022-02-17 ENCOUNTER — Encounter: Payer: Self-pay | Admitting: Pharmacist

## 2022-02-17 DIAGNOSIS — Z9229 Personal history of other drug therapy: Secondary | ICD-10-CM

## 2022-02-17 NOTE — Progress Notes (Signed)
[facility])                                            [place] Team                                        Statin Quality Measure Assessment    02/17/2022  Wendy Mosley 1950-11-05 [id number]  Per review of chart and payor information, patient has a diagnosis of diabetes but is not currently filling a statin prescription.  This places patient into the SUPD (Statin Use In Patients with Diabetes) measure for CMS.    Patient has documented trials of atorvastatin  with reported hair loss, but no corresponding CPT codes that would exclude patient from SUPD measure. Although hair loss does not have an "Approved" statin exclusion code, there was some mention of her being on rosuvastatin but there is no fill history to support rosuvastatin being filled.    Patient has an appointment on 02/18/22.  If deemed therapeutically appropriate, patient could be assess for statin therapy with an alternative statin or investigating the rosuvastatin.   The ASCVD Risk score (Arnett DK, et al., 2019) failed to calculate for the following reasons:   The patient has a prior MI or stroke diagnosis 11/24/2021     Component Value Date/Time   CHOL 190 11/24/2021 1152   TRIG 137 11/24/2021 1152   HDL 41 11/24/2021 1152   CHOLHDL 4.6 (H) 11/24/2021 1152   CHOLHDL 6.0 06/15/2021 1717   VLDL 15 06/15/2021 1717   LDLCALC 124 (H) 11/24/2021 1152   LDLDIRECT 138 (H) 07/02/2011 1521    Please consider ONE of the following recommendations:  Initiate high intensity statin Atorvastatin '40mg'$  once daily, #90, 3 refills   Rosuvastatin '20mg'$  once daily, #90, 3 refills    Initiate moderate intensity          statin with reduced frequency if prior          statin intolerance 1x weekly, #13, 3 refills   2x weekly, #26, 3 refills   3x weekly, #39, 3 refills    Code for past statin intolerance or  other exclusions (required annually)   Provider  Requirements:  Associate code during an office visit or telehealth encounter  Drug Induced Myopathy G72.0   Myopathy, unspecified G72.9   Myositis, unspecified M60.9   Rhabdomyolysis W09.81   Alcoholic fatty liver X91.4   Cirrhosis of liver K74.69   Prediabetes R73.03   PCOS E28.2   Toxic liver disease, unspecified K71.9        Plan:  Send note to patient's provider prior to the upcoming appointment on 02/18/22.  Elayne Guerin, PharmD, [place] Clinical Pharmacist [phone]

## 2022-02-17 NOTE — Progress Notes (Signed)
HPI: Wendy Mosley is a 71 y.o. female with PMHx  significant for DM II, aortic atherosclerosis,HLD, and HTN here today to establish care.  Former PCP: [name] Last preventive routine visit: 11/24/21  GERD on Gelusil daily as needed. She does not take Protonix 20 mg. Occasional heartburn.  She reports having headaches for the past few months, located in the frontoparietal area, which are more "aggravating" than painful. She does not take any medication for the headaches, and they usually resolve spontaneously. No associated symptoms. She has not identified exacerbating or alleviating factors. She has felt some "knots" on scalp and wonders this is causing her headaches. Problem has been going on for a while and has not noted changes.  HTN on non pharmacologic treatment. Negative for visual changes, chest pain, dyspnea, palpitation focal weakness, or edema.  She has a smoking history of one pack per day for 30 years.She quit in 2012.  HLD and aortic atherosclerosis. She has been experiencing hair loss and body aches with statins. She is on Zetia 10 mg daily. Lab Results  Component Value Date   CHOL 190 11/24/2021   HDL 41 11/24/2021   LDLCALC 124 (H) 11/24/2021   LDLDIRECT 138 (H) 07/02/2011   TRIG 137 11/24/2021   CHOLHDL 4.6 (H) 11/24/2021   DM II: Dx'ed in 2022. She is no longer taking metformin or Jardiance because they were causing recurrent yeast infections. She has not been checking her blood sugars recently. Stopped medications about 2-3 months ago.  Negative for symptoms of hypoglycemia, polyuria, polydipsia, numbness extremities, foot ulcers/trauma  Lab Results  Component Value Date   HGBA1C 6.3 (H) 11/24/2021   Lab Results  Component Value Date   MICROALBUR <0.7 02/18/2022   She is still having external genital pruritus despite of stopping hypoglycemic agents. She mentions having used topical estrogen, which caused breast tenderness that resolved after  discontinuing the medication.Topical estrogens did not help with pruritus. Occasional vaginal discharge, no bleeding. Denies risk factors for STD's.  Review of Systems  Constitutional:  Negative for activity change, appetite change and fever.  HENT:  Negative for mouth sores and nosebleeds.   Respiratory:  Negative for cough and wheezing.   Gastrointestinal:  Negative for abdominal pain, nausea and vomiting.       Negative for changes in bowel habits.  Genitourinary:  Negative for decreased urine volume, dysuria and hematuria.  Skin:  Negative for rash.  Neurological:  Negative for syncope, facial asymmetry and weakness.  See other pertinent positives and negatives in HPI.  Current Outpatient Medications on File Prior to Visit  Medication Sig Dispense Refill   aspirin EC 81 MG tablet Take 1 tablet (81 mg total) by mouth daily. Swallow whole. 90 tablet 3   B COMPLEX-ZINC PO Take 1 tablet by mouth daily.     diclofenac Sodium (VOLTAREN) 1 % GEL Apply 2 g topically 4 (four) times daily. 100 g 3   ezetimibe (ZETIA) 10 MG tablet Take 1 tablet (10 mg total) by mouth daily. 90 tablet 3   loratadine (CLARITIN) 10 MG tablet Take 10 mg by mouth daily as needed for allergies.     nitroGLYCERIN (NITROSTAT) 0.4 MG SL tablet Place 1 tablet (0.4 mg total) under the tongue every 5 (five) minutes x 3 doses as needed for chest pain. 10 tablet 0   pantoprazole (PROTONIX) 20 MG tablet Take two tablets ('40mg'$ ) daily for two weeks. Then return to one tablet ('20mg'$ ) daily. 90 tablet 1  No current facility-administered medications on file prior to visit.   Past Medical History:  Diagnosis Date   CAD in native artery 06/06/2020   CAD native.  Calcium score 176, 85th percentile 04/2020   Cataracts, bilateral    right eye - just watching   Diabetes mellitus without complication (HCC)    Diverticulitis    GERD (gastroesophageal reflux disease) 2006   Hearing loss    right ear - no hearing aid   Heart attack  ([place]) 2008   Hypertension    Irregular heart beat 1973   Palpitations 11/30/2019   SVD (spontaneous vaginal delivery)    x 3   Allergies  Allergen Reactions   Lipitor [Atorvastatin] Other (See Comments)    Hair loss   Sulfonamide Derivatives     Rash/itching    Family History  Problem Relation Age of Onset   Asthma Mother    Stroke Mother    COPD Mother    Heart failure Mother    Diabetes Sister    Cancer Maternal Grandmother        Lukemia   Breast cancer Neg Hx    Social History   Socioeconomic History   Marital status: Married    Spouse name: Not on file   Number of children: Not on file   Years of education: Not on file   Highest education level: Not on file  Occupational History   Not on file  Tobacco Use   Smoking status: Former    Packs/day: 1.00    Years: 30.00    Total pack years: 30.00    Types: Cigarettes    Quit date: 02/01/2011    Years since quitting: 11.0   Smokeless tobacco: Never  Vaping Use   Vaping Use: Never used  Substance and Sexual Activity   Alcohol use: Not Currently    Comment: occasional glass of wine   Drug use: No    Comment: No IV drug   Sexual activity: Not Currently    Birth control/protection: Post-menopausal  Other Topics Concern   Not on file  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[place]   [address] [place]   Legally Separated to Second Husband         Social Determinants of Health   Financial Resource Strain: Low Risk  (11/19/2021)   Overall Financial Resource Strain (CARDIA)    Difficulty of Paying Living Expenses: Not hard at all  Food Insecurity: Food Insecurity Present (11/20/2021)   Hunger Vital Sign    Worried About [place] in the Last Year: Sometimes true    Ran Out of Food in the Last Year: Sometimes true  Transportation Needs: No Transportation Needs (11/19/2021)   PRAPARE - Hydrologist (Medical): No    Lack of Transportation  (Non-Medical): No  Physical Activity: Inactive (11/19/2021)   Exercise Vital Sign    Days of Exercise per Week: 0 days    Minutes of Exercise per Session: 0 min  Stress: No Stress Concern Present (11/19/2021)   [place]    Feeling of Stress : Not at all  Social Connections: Not on file   Vitals:   02/18/22 1054  BP: 128/80  Pulse: 72  Resp: 16  Temp: 98 F (36.7 C)  SpO2: 97%   Body mass index is 29.59 kg/m.  Physical Exam Vitals and nursing note reviewed. Exam conducted  with a chaperone present.  Constitutional:      General: She is not in acute distress.    Appearance: She is well-developed.  HENT:     Head: Normocephalic and atraumatic.     Comments: Mild skull irregular surface but no clear mass or suspicious lesion. No tenderness.    Nose:     Right Sinus: No maxillary sinus tenderness or frontal sinus tenderness.     Left Sinus: No maxillary sinus tenderness or frontal sinus tenderness.     Mouth/Throat:     Mouth: Mucous membranes are moist.     Pharynx: Oropharynx is clear.     Comments: Missing teeth.  Eyes:     Conjunctiva/sclera: Conjunctivae normal.  Cardiovascular:     Rate and Rhythm: Normal rate and regular rhythm.     Pulses:          Dorsalis pedis pulses are 2+ on the right side and 2+ on the left side.     Heart sounds: No murmur heard. Pulmonary:     Effort: Pulmonary effort is normal. No respiratory distress.     Breath sounds: Normal breath sounds.  Abdominal:     Palpations: Abdomen is soft. There is no hepatomegaly or mass.     Tenderness: There is no abdominal tenderness.  Genitourinary:    Exam position: Lithotomy position.     Labia:        Right: No tenderness.        Left: No tenderness.      Vagina: No vaginal discharge or erythema.     Comments: Vaginal mucosa atrophic. Whitish plaques on major labium, R>L. No erythema or induration. Defined  borders. Lymphadenopathy:     Cervical: No cervical adenopathy.  Skin:    General: Skin is warm.     Findings: No erythema or rash.  Neurological:     General: No focal deficit present.     Mental Status: She is alert and oriented to person, place, and time.     Cranial Nerves: No cranial nerve deficit.     Gait: Gait normal.  Psychiatric:        Mood and Affect: Mood and affect normal.    Diabetic Foot Exam - Simple   Simple Foot Form Diabetic Foot exam was performed with the following findings: Yes 02/18/2022 11:30 AM  Visual Inspection No deformities, no ulcerations, no other skin breakdown bilaterally: Yes Sensation Testing Intact to touch and monofilament testing bilaterally: Yes Pulse Check Posterior Tibialis and Dorsalis pulse intact bilaterally: Yes Comments     ASSESSMENT AND PLAN: Ms.Clint was seen today for establish care.  Diagnoses and all orders for this visit: Type 2 diabetes mellitus with other specified complication, without long-term current use of insulin ([place]) Assessment & Plan: HgA1C at goal, 6.3 on 11/24/21. Continue non pharmacologic treatment. Regular exercise and healthy diet with avoidance of added sugar food intake is an important part of treatment and recommended. Annual eye exam, periodic dental and foot care recommended. F/U in 3 months.  Orders: -     Microalbumin / creatinine urine ratio; Future  Primary hypertension Assessment & Plan: BP adequately controlled. Continue monitoring BP regularly. Continue non pharmacologic treatment.   Gastroesophageal reflux disease without esophagitis Assessment & Plan: She does not take Protonix frequently, she takes Gelusil daily as needed instead. Continue GERD precautions.   Screening for lung cancer -     Ambulatory Referral for Lung Cancer Scre  Lichen sclerosus et atrophicus of the vulva  Assessment & Plan: C/O vulvar pruritus, examination suggestive of lichen sclerosus, which she has  been dx'ed with in 2017.  We discussed Dx, prognosis,and treatment options. She has not seen gyn, may need Bx done. Triamcinolone cream daily on affected area recommended. Gyn referral placed.  Orders: -     Ambulatory referral to Gynecology -     Triamcinolone Acetonide; Apply 1 Application topically daily as needed (on affected skin.).  Dispense: 30 g; Refill: 1  Headache, unspecified headache type Assessment & Plan: Hx and examination do not suggest a serious process. ? Tension like headache, allergies can also be a contributing factors. Monitor for new symptoms. Instructed about warning signs. 05/2021 head CT was negative for acute intracranial abnormalities.Mild chronic microvascular ischemic changes in the cerebral white matter, and old left basal ganglia lacunar infarct, as above.   Hyperlipidemia, unspecified hyperlipidemia type Assessment & Plan: LDL is not at goal, 124 in 10/2021. She has not tolerated statins in the past. Continue Zetia 10 mg daily and low fat diet.    I spent a total of 41 minutes in both face to face and non face to face activities for this visit on the date of this encounter. During this time history was obtained and documented, examination was performed, prior labs/imaging reviewed, and assessment/plan discussed.  Return in about 3 months (around 05/20/2022) for chronic problems.  Treshun Wold G. Martinique, MD  [facility]. [place] office.

## 2022-02-18 ENCOUNTER — Ambulatory Visit (INDEPENDENT_AMBULATORY_CARE_PROVIDER_SITE_OTHER): Payer: Medicare HMO | Admitting: Family Medicine

## 2022-02-18 ENCOUNTER — Encounter: Payer: Self-pay | Admitting: Family Medicine

## 2022-02-18 VITALS — BP 128/80 | HR 72 | Temp 98.0°F | Resp 16 | Ht 63.2 in | Wt 168.1 lb

## 2022-02-18 DIAGNOSIS — N904 Leukoplakia of vulva: Secondary | ICD-10-CM | POA: Diagnosis not present

## 2022-02-18 DIAGNOSIS — E1169 Type 2 diabetes mellitus with other specified complication: Secondary | ICD-10-CM

## 2022-02-18 DIAGNOSIS — R519 Headache, unspecified: Secondary | ICD-10-CM | POA: Diagnosis not present

## 2022-02-18 DIAGNOSIS — L292 Pruritus vulvae: Secondary | ICD-10-CM

## 2022-02-18 DIAGNOSIS — Z122 Encounter for screening for malignant neoplasm of respiratory organs: Secondary | ICD-10-CM

## 2022-02-18 DIAGNOSIS — E785 Hyperlipidemia, unspecified: Secondary | ICD-10-CM | POA: Diagnosis not present

## 2022-02-18 DIAGNOSIS — K219 Gastro-esophageal reflux disease without esophagitis: Secondary | ICD-10-CM

## 2022-02-18 DIAGNOSIS — I10 Essential (primary) hypertension: Secondary | ICD-10-CM

## 2022-02-18 LAB — MICROALBUMIN / CREATININE URINE RATIO
Creatinine,U: 38 mg/dL
Microalb Creat Ratio: 1.8 mg/g (ref 0.0–30.0)
Microalb, Ur: <0.7 mg/dL (ref 0.0–1.9)

## 2022-02-18 MED ORDER — TRIAMCINOLONE ACETONIDE 0.1 % EX CREA: 1.0000 | TOPICAL_CREAM | Freq: Every day | CUTANEOUS | 1 refills | Status: DC | PRN

## 2022-02-18 NOTE — Patient Instructions (Addendum)
A few things to remember from today's visit:  Primary hypertension  Gastroesophageal reflux disease without esophagitis  Hyperlipidemia, unspecified hyperlipidemia type  Screening for lung cancer - Plan: Ambulatory Referral for Lung Cancer Scre  Vulvar itching - Plan: Ambulatory referral to Gynecology  Type 2 diabetes mellitus with other specified complication, without long-term current use of insulin ([place]) - Plan: Microalbumin / creatinine urine ratio  Headache, unspecified headache type Headache seems tension headache. Avoid ibuprofen, take Acetaminophen if neeed and no more than 2 times per week.  Apply cream outside, small amount for itching. Appt with gynecologist will be arranged.  If you need refills for medications you take chronically, please call your pharmacy. Do not use My Chart to request refills or for acute issues that need immediate attention. If you send a my chart message, it may take a few days to be addressed, specially if I am not in the office.  Please be sure medication list is accurate. If a new problem present, please set up appointment sooner than planned today.

## 2022-02-21 ENCOUNTER — Encounter: Payer: Self-pay | Admitting: Family Medicine

## 2022-02-21 NOTE — Assessment & Plan Note (Signed)
BP adequately controlled. Continue monitoring BP regularly. Continue non pharmacologic treatment.

## 2022-02-21 NOTE — Assessment & Plan Note (Signed)
She does not take Protonix frequently, she takes Gelusil daily as needed instead. Continue GERD precautions.

## 2022-02-21 NOTE — Assessment & Plan Note (Signed)
HgA1C at goal, 6.3 on 11/24/21. Continue non pharmacologic treatment. Regular exercise and healthy diet with avoidance of added sugar food intake is an important part of treatment and recommended. Annual eye exam, periodic dental and foot care recommended. F/U in 3 months.

## 2022-02-21 NOTE — Assessment & Plan Note (Signed)
LDL is not at goal, 124 in 10/2021. She has not tolerated statins in the past. Continue Zetia 10 mg daily and low fat diet.

## 2022-02-21 NOTE — Assessment & Plan Note (Signed)
Hx and examination do not suggest a serious process. ? Tension like headache, allergies can also be a contributing factors. Monitor for new symptoms. Instructed about warning signs. 05/2021 head CT was negative for acute intracranial abnormalities.Mild chronic microvascular ischemic changes in the cerebral white matter, and old left basal ganglia lacunar infarct, as above.

## 2022-02-21 NOTE — Assessment & Plan Note (Signed)
C/O vulvar pruritus, examination suggestive of lichen sclerosus, which she has been dx'ed with in 2017.  We discussed Dx, prognosis,and treatment options. She has not seen gyn, may need Bx done. Triamcinolone cream daily on affected area recommended. Gyn referral placed.

## 2022-03-13 DIAGNOSIS — G4733 Obstructive sleep apnea (adult) (pediatric): Secondary | ICD-10-CM | POA: Diagnosis not present
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
## 2022-03-26 ENCOUNTER — Ambulatory Visit: Payer: Medicare HMO | Admitting: Nurse Practitioner

## 2022-03-27 ENCOUNTER — Encounter: Payer: Self-pay | Admitting: Family Medicine

## 2022-03-27 ENCOUNTER — Ambulatory Visit (INDEPENDENT_AMBULATORY_CARE_PROVIDER_SITE_OTHER): Payer: Medicare Other | Admitting: Family Medicine

## 2022-03-27 VITALS — BP 128/70 | HR 81 | Temp 98.1°F | Resp 16 | Ht 63.2 in | Wt 171.0 lb

## 2022-03-27 DIAGNOSIS — R202 Paresthesia of skin: Secondary | ICD-10-CM | POA: Diagnosis not present

## 2022-03-27 DIAGNOSIS — I10 Essential (primary) hypertension: Secondary | ICD-10-CM | POA: Diagnosis not present

## 2022-03-27 DIAGNOSIS — M549 Dorsalgia, unspecified: Secondary | ICD-10-CM | POA: Diagnosis not present

## 2022-03-27 DIAGNOSIS — N904 Leukoplakia of vulva: Secondary | ICD-10-CM

## 2022-03-27 LAB — BASIC METABOLIC PANEL
BUN: 13 mg/dL (ref 6–23)
CO2: 28 mEq/L (ref 19–32)
Calcium: 9.4 mg/dL (ref 8.4–10.5)
Chloride: 105 mEq/L (ref 96–112)
Creatinine, Ser: 0.81 mg/dL (ref 0.40–1.20)
GFR: 72.89 mL/min (ref >60.00)
Glucose, Bld: 90 mg/dL (ref 70–99)
Potassium: 3.8 mEq/L (ref 3.5–5.1)
Sodium: 142 mEq/L (ref 135–145)

## 2022-03-27 LAB — VITAMIN B12: Vitamin B-12: 834 pg/mL (ref 211–911)

## 2022-03-27 MED ORDER — TRIAMCINOLONE ACETONIDE 0.1 % EX CREA: 1.0000 | TOPICAL_CREAM | Freq: Every day | CUTANEOUS | 1 refills | Status: AC | PRN
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
## 2022-03-31 DIAGNOSIS — G4733 Obstructive sleep apnea (adult) (pediatric): Secondary | ICD-10-CM | POA: Diagnosis not present

## 2022-04-02 ENCOUNTER — Telehealth: Payer: Self-pay | Admitting: Cardiology

## 2022-04-02 NOTE — Telephone Encounter (Signed)
Patient contacted 3x to schedule sleep compliance appt with Dr. Radford Pax with no success in scheduling

## 2022-04-13 DIAGNOSIS — G4733 Obstructive sleep apnea (adult) (pediatric): Secondary | ICD-10-CM | POA: Diagnosis not present

## 2022-04-14 ENCOUNTER — Encounter (HOSPITAL_BASED_OUTPATIENT_CLINIC_OR_DEPARTMENT_OTHER): Payer: Self-pay | Admitting: Emergency Medicine

## 2022-04-14 ENCOUNTER — Emergency Department (HOSPITAL_BASED_OUTPATIENT_CLINIC_OR_DEPARTMENT_OTHER): Payer: Medicare Other | Admitting: Radiology

## 2022-04-14 ENCOUNTER — Other Ambulatory Visit: Payer: Self-pay

## 2022-04-14 ENCOUNTER — Emergency Department (HOSPITAL_BASED_OUTPATIENT_CLINIC_OR_DEPARTMENT_OTHER)
Admission: EM | Admit: 2022-04-14 | Discharge: 2022-04-14 | Disposition: A | Discharge status: Home / Self Care | Payer: Medicare Other | Attending: Emergency Medicine | Admitting: Emergency Medicine

## 2022-04-14 DIAGNOSIS — J40 Bronchitis, not specified as acute or chronic: Secondary | ICD-10-CM | POA: Insufficient documentation

## 2022-04-14 DIAGNOSIS — R519 Headache, unspecified: Secondary | ICD-10-CM | POA: Diagnosis not present

## 2022-04-14 DIAGNOSIS — Z79899 Other long term (current) drug therapy: Secondary | ICD-10-CM | POA: Insufficient documentation

## 2022-04-14 DIAGNOSIS — Z7982 Long term (current) use of aspirin: Secondary | ICD-10-CM | POA: Insufficient documentation

## 2022-04-14 DIAGNOSIS — J479 Bronchiectasis, uncomplicated: Secondary | ICD-10-CM | POA: Insufficient documentation

## 2022-04-14 DIAGNOSIS — Z1152 Encounter for screening for COVID-19: Secondary | ICD-10-CM | POA: Diagnosis not present

## 2022-04-14 DIAGNOSIS — R051 Acute cough: Secondary | ICD-10-CM | POA: Diagnosis present

## 2022-04-14 DIAGNOSIS — I10 Essential (primary) hypertension: Secondary | ICD-10-CM | POA: Insufficient documentation

## 2022-04-14 DIAGNOSIS — I2510 Atherosclerotic heart disease of native coronary artery without angina pectoris: Secondary | ICD-10-CM | POA: Insufficient documentation

## 2022-04-14 DIAGNOSIS — R059 Cough, unspecified: Secondary | ICD-10-CM | POA: Diagnosis not present

## 2022-04-14 DIAGNOSIS — R6883 Chills (without fever): Secondary | ICD-10-CM | POA: Diagnosis not present

## 2022-04-14 LAB — RESP PANEL BY RT-PCR (RSV, FLU A&B, COVID)  RVPGX2
Influenza A by PCR: NEGATIVE
Influenza B by PCR: NEGATIVE
Resp Syncytial Virus by PCR: NEGATIVE
SARS Coronavirus 2 by RT PCR: NEGATIVE

## 2022-04-14 MED ORDER — DOXYCYCLINE HYCLATE 100 MG PO CAPS: 100.0000 mg | ORAL_CAPSULE | Freq: Two times a day (BID) | ORAL | 0 refills | Status: AC

## 2022-04-14 MED ORDER — PREDNISONE 20 MG PO TABS: 40.0000 mg | ORAL_TABLET | Freq: Every day | ORAL | 0 refills | Status: AC

## 2022-04-14 NOTE — Discharge Instructions (Addendum)
Were given a prescription for antibiotics needed to help treat your bronchitis.  Please take 1 tablet twice a day for the next 7 days.  In addition you were also given a prescription for steroids, please take 2 tablets daily for the next 5 days.  Please be aware that steroids can cause flushness, appetite changes, insomnia.

## 2022-04-14 NOTE — ED Triage Notes (Signed)
Cough, chills, body aches, nasal/chest congestion headache

## 2022-04-14 NOTE — ED Provider Notes (Signed)
[facility] Provider Note   CSN: [id number] Arrival date & time: 04/14/22  1831     History  Chief Complaint  Patient presents with   Cough    Wendy Mosley is a 72 y.o. female.  72 year old female with a past medical history of hypertension, CAD presents to the ED with a chief complaint of cough for the past 5 days.  Patient reports overall myalgias, sore throat, nasal congestion for the past 5 days.  No exacerbating factors.  Has taken Tylenol, cough syrup, over-the-counter medication without any improvement in her symptoms.  She denies any medication changes, no swelling to her legs.  Has been running a fever at home however is unsure on the number.  No chest pain, no shortness of breath, no leg swelling.  The history is provided by the patient and the spouse.  Cough Associated symptoms: no chills and no fever        Home Medications Prior to Admission medications   Medication Sig Start Date End Date Taking? Authorizing Provider  doxycycline (VIBRAMYCIN) 100 MG capsule Take 1 capsule (100 mg total) by mouth 2 (two) times daily for 7 days. 04/14/22 04/21/22 Yes Martia Dalby, Beverley Fiedler, PA-C  predniSONE (DELTASONE) 20 MG tablet Take 2 tablets (40 mg total) by mouth daily for 5 days. 04/14/22 04/19/22 Yes Janeece Fitting, PA-C  aspirin EC 81 MG tablet Take 1 tablet (81 mg total) by mouth daily. Swallow whole. 07/03/21   Loel Dubonnet, NP  B COMPLEX-ZINC PO Take 1 tablet by mouth daily.    [provider]  diclofenac Sodium (VOLTAREN) 1 % GEL Apply 2 g topically 4 (four) times daily. 05/15/20   [name], FNP  ezetimibe (ZETIA) 10 MG tablet Take 1 tablet (10 mg total) by mouth daily. 07/03/21 07/03/22  Loel Dubonnet, NP  loratadine (CLARITIN) 10 MG tablet Take 10 mg by mouth daily as needed for allergies.    [provider]  nitroGLYCERIN (NITROSTAT) 0.4 MG SL tablet Place 1 tablet (0.4 mg total) under the tongue every 5 (five)  minutes x 3 doses as needed for chest pain. 06/16/21   Allie Bossier, MD  pantoprazole (PROTONIX) 20 MG tablet Take two tablets (72m) daily for two weeks. Then return to one tablet (235m daily. 09/04/21   WaLoel DubonnetNP  triamcinolone cream (KENALOG) 0.1 % Apply 1 Application topically daily as needed (on affected skin.). 03/27/22   JoMartiniqueBetty G, MD      Allergies    Lipitor [atorvastatin] and Sulfonamide derivatives    Review of Systems   Review of Systems  Constitutional:  Negative for chills and fever.  Respiratory:  Positive for cough.     Physical Exam Updated Vital Signs BP (!) 155/94 (BP Location: Right Arm)   Pulse 91   Temp 99 F (37.2 C) (Oral)   Resp 20   SpO2 95%  Physical Exam Vitals and nursing note reviewed.  Constitutional:      General: She is not in acute distress.    Appearance: She is well-developed.  HENT:     Head: Normocephalic and atraumatic.     Nose: Congestion present.     Mouth/Throat:     Pharynx: No oropharyngeal exudate.     Comments: Oropharynx is clear with some mild erythema noted. Eyes:     Pupils: Pupils are equal, round, and reactive to light.  Cardiovascular:     Rate and Rhythm: Regular rhythm.  Heart sounds: Normal heart sounds.  Pulmonary:     Effort: Pulmonary effort is normal. No respiratory distress.     Breath sounds: No wheezing.     Comments: Lungs are diminished to auscultation at the bases however no obvious wheezing or rales noted. Abdominal:     General: Bowel sounds are normal. There is no distension.     Palpations: Abdomen is soft.     Tenderness: There is no abdominal tenderness.  Musculoskeletal:        General: No tenderness or deformity.     Cervical back: Normal range of motion.     Right lower leg: No edema.     Left lower leg: No edema.  Skin:    General: Skin is warm and dry.  Neurological:     Mental Status: She is alert and oriented to person, place, and time.     ED Results /  Procedures / Treatments   Labs (all labs ordered are listed, but only abnormal results are displayed) Labs Reviewed  RESP PANEL BY RT-PCR (RSV, FLU A&B, COVID)  [id number]    EKG None  Radiology DG Chest 2 View  Result Date: 04/14/2022 CLINICAL DATA:  Cough for 5 days. Chills, body aches, congestion, and headache. EXAM: CHEST - 2 VIEW COMPARISON:  06/15/2021 FINDINGS: Heart size and pulmonary vascularity are normal. Streaky perihilar infiltrates with peribronchial thickening and mild bronchiectasis suggesting bronchitis or airways disease, progressing since prior study. No focal consolidation or airspace disease. No pleural effusions. No pneumothorax. Mediastinal contours appear intact. Calcification of the aorta. Surgical clips in the right upper quadrant. IMPRESSION: Perihilar and peribronchial changes with mild bronchiectasis, progressing since prior study. Changes likely represent bronchitis or airways disease. No focal consolidation. Electronically Signed   By: Lucienne Capers M.D.   On: 04/14/2022 21:57    Procedures Procedures    Medications Ordered in ED Medications - No data to display  ED Course/ Medical Decision Making/ A&P                             Medical Decision Making Amount and/or Complexity of Data Reviewed Radiology: ordered.  Risk Prescription drug management.   Patient presents to the ED with a chief complaint of cough for the past 5 days, reports body aches, fevers at home which has not recorded her temperature but reports she feels chills.  She is currently on no ACE inhibitor's, no medication changes, no swelling in her legs and no orthopnea.  During evaluation patient does have borderline fever with a temperature of 99, lungs are decreased to auscultation, nasal congestion noted.  Respiratory panel is negative for influenza, RSV, COVID-19.  X-ray was ordered to rule out pneumonia as patient symptoms have been ongoing for 5 days.  Chest x-ray  showed Perihilar and peribronchial changes with mild bronchiectasis,  progressing since prior study. Changes likely represent bronchitis  or airways disease. No focal consolidation.   Discussed results with patient, she denies any prior history of diabetes.  We did discuss a short course of doxycycline to help with her bronchitis, along with steroids to help with decreasing in her cough and inflammation.  She is overall nontoxic-appearing, vitals have remained stable.  Patient is hemodynamically stable for discharge.    Portions of this note were generated with Lobbyist. Dictation errors may occur despite best attempts at proofreading.   Final Clinical Impression(s) / ED Diagnoses Final diagnoses:  Acute cough  Bronchitis    Rx / DC Orders ED Discharge Orders          Ordered    doxycycline (VIBRAMYCIN) 100 MG capsule  2 times daily        04/14/22 2234    predniSONE (DELTASONE) 20 MG tablet  Daily        04/14/22 2234              Janeece Fitting, PA-C 04/14/22 2242    Dorie Rank, MD 04/17/22 1040

## 2022-04-16 ENCOUNTER — Telehealth: Payer: Self-pay

## 2022-04-21 ENCOUNTER — Telehealth: Payer: Self-pay

## 2022-04-21 NOTE — Transitions of Care (Post Inpatient/ED Visit) (Signed)
   04/21/2022  Name: Wendy Mosley MRN: [id number] DOB: Jul 15, 1950  Today's TOC FU Call Status: Today's TOC FU Call Status:: Successful TOC FU Call Competed TOC FU Call Complete Date: 04/21/22  Transition Care Management Follow-up Telephone Call Date of Discharge: 04/14/22 Discharge Facility: [facility] ([facility]) Type of Discharge: Emergency Department Reason for ED Visit: Respiratory, Other: Respiratory Diagnosis:  (acute cough) How have you been since you were released from the hospital?: Better Any questions or concerns?: No  Items Reviewed: Did you receive and understand the discharge instructions provided?: Yes Medications obtained and verified?: Yes (Medications Reviewed) Any new allergies since your discharge?: No Dietary orders reviewed?: No Do you have support at home?: Yes  Home Care and Equipment/Supplies: [place] Ordered?: No Any new equipment or medical supplies ordered?: No  Functional Questionnaire: Do you need assistance with bathing/showering or dressing?: No Do you need assistance with meal preparation?: No Do you need assistance with eating?: No Do you have difficulty maintaining continence: No Do you need assistance with getting out of bed/getting out of a chair/moving?: No Do you have difficulty managing or taking your medications?: No  Folllow up appointments reviewed: PCP Follow-up appointment confirmed?: No MD Provider Line Number:[phone] Given: No Specialist Hospital Follow-up appointment confirmed?: No Reason Specialist Follow-Up Not Confirmed: Patient has Specialist Provider Number and will Call for Appointment Do you need transportation to your follow-up appointment?: No Do you understand care options if your condition(s) worsen?: Yes-patient verbalized understanding    Mosley, Wendy

## 2022-05-04 ENCOUNTER — Encounter (HOSPITAL_BASED_OUTPATIENT_CLINIC_OR_DEPARTMENT_OTHER): Payer: Self-pay | Admitting: Cardiovascular Disease

## 2022-05-04 ENCOUNTER — Telehealth: Payer: Self-pay | Admitting: Family Medicine

## 2022-05-04 ENCOUNTER — Ambulatory Visit (HOSPITAL_BASED_OUTPATIENT_CLINIC_OR_DEPARTMENT_OTHER): Payer: Medicare Other | Admitting: Cardiovascular Disease

## 2022-05-04 VITALS — BP 136/76 | HR 89 | Ht 63.2 in | Wt 173.0 lb

## 2022-05-04 DIAGNOSIS — G4733 Obstructive sleep apnea (adult) (pediatric): Secondary | ICD-10-CM | POA: Diagnosis not present

## 2022-05-04 DIAGNOSIS — I5042 Chronic combined systolic (congestive) and diastolic (congestive) heart failure: Secondary | ICD-10-CM

## 2022-05-04 DIAGNOSIS — I10 Essential (primary) hypertension: Secondary | ICD-10-CM | POA: Diagnosis not present

## 2022-05-04 DIAGNOSIS — T466X5A Adverse effect of antihyperlipidemic and antiarteriosclerotic drugs, initial encounter: Secondary | ICD-10-CM

## 2022-05-04 DIAGNOSIS — G720 Drug-induced myopathy: Secondary | ICD-10-CM

## 2022-05-04 DIAGNOSIS — E785 Hyperlipidemia, unspecified: Secondary | ICD-10-CM

## 2022-05-04 HISTORY — DX: Obstructive sleep apnea (adult) (pediatric): G47.33

## 2022-05-04 NOTE — Assessment & Plan Note (Signed)
LDL goal is less than 70 given her history of stroke.  Continue Zetia.  Recommended that she consider a PCSK9 inhibitor.  She politely declines.  Work on diet and exercise as above.

## 2022-05-04 NOTE — Assessment & Plan Note (Addendum)
Systolic function has normalized.  She is euvolemic and doing well.  Her blood pressure goal is less than 130/80.  It is nearly controlled but she prefers not to start any medications.  We have referred her to the PREP program at the [facility].  Work on increasing exercise to at least 150 minutes weekly.  Continue to work on limiting sodium intake.  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  She now has no unstable cardiac conditions.  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

## 2022-05-04 NOTE — Assessment & Plan Note (Signed)
Blood pressure nearly controlled as above.  Working on lifestyle changes.  Blood pressure goal less than 130/80.

## 2022-05-04 NOTE — Telephone Encounter (Signed)
Patient dropped off document  [employer] Diabetes Form , to be filled out by provider. Patient requested to send it via Call Patient to pick up within ASAP her job is on the line. Document is located in providers tray at front office.Please advise at Mobile [phone] (mobile)   Please advise

## 2022-05-04 NOTE — Assessment & Plan Note (Signed)
She has been using her CPAP as prescribed.

## 2022-05-04 NOTE — Patient Instructions (Signed)
Medication Instructions:  Your physician recommends that you continue on your current medications as directed. Please refer to the Current Medication list given to you today.   *If you need a refill on your cardiac medications before your next appointment, please call your pharmacy*  Lab Work: NONE  Testing/Procedures: NONE  Follow-Up: At [facility], you and your health needs are our priority.  As part of our continuing mission to provide you with exceptional heart care, we have created designated Provider Care Teams.  These Care Teams include your primary Cardiologist (physician) and Advanced Practice Providers (APPs -  Physician Assistants and Nurse Practitioners) who all work together to provide you with the care you need, when you need it.  We recommend signing up for the patient portal called "MyChart".  Sign up information is provided on this After Visit Summary.  MyChart is used to connect with patients for Virtual Visits (Telemedicine).  Patients are able to view lab/test results, encounter notes, upcoming appointments, etc.  Non-urgent messages can be sent to your provider as well.   To learn more about what you can do with MyChart, go to [email or web address].    Your next appointment:   6 month(s)  Provider:   Skeet Latch, MD   YOU HAVE BEEN REFERRED TO THE PREP PROGRAM. IF YOU DO NOT HEAR FROM [facility] OR [facility] IN 2 WEEKS CALL THE OFFICE TO FOLLOW UP

## 2022-05-04 NOTE — Progress Notes (Signed)
Cardiology Office Note  Date:  05/04/2022   ID:  Wendy Mosley, DOB 1950-07-11, MRN [id number]  PCP:  Martinique, Betty G, MD  Cardiologist:   Skeet Latch, MD   No chief complaint on file.    History of Present Illness: Wendy Mosley is a 72 y.o. female with hypertension, hyperlipidemia, prior stroke, and prior tobacco abuse who is being seen today for follow-up. She was initially seen 9/31/2021 for the evaluation of palpitations at the request of [name], [place].  She saw [name] on 10/25/2019 he reported episodes of racing heart.  EKG at the time revealed sinus rhythm with no arrhythmias.  Thyroid function, CMP and CBC were unremarkable.  One night she laid down on the sofa and her heart started racing. It wouldn't stop with positional changes.  The episode lasted for several minutes and she felt short of breath.  She noted slight pain in her head at the time.  She also noted some lightheadedness and mild nausea.   Her blood pressure and cholesterol are poorly controlled.  However she was hesitant to start any medications.  She tried several medications in the past and didn't tolerate them.   She wanted to work on diet and exercise.  She struggled with depression after her daughter died from cancer March 25, 2020.  She was hospitalized 05/2021 with weakness and left-sided numbness. She was in SVT in the 150s and hypotensive to 81/59. MRI showed an old stroke but no acute findings. Troponin was elevated, and she had a coronary CTA that revealed minimal, nonobstructive disease and calcium score 238 which was 87th percentile. Echo revealed LVEF 45-50% with global hypokinesis and grade 1 diastolic dysfunction. She had a sleep study that was positive for sleep apnea. She was started on Jardiance but did not tolerate it. She last saw Laurann Montana, NP 12/2021 and hesitantly agreed to start using her CPAP. She had a repeat echo 12/2021 that revealed LVEF 55-60% and grade 1 diastolic dysfunction.  Today,  she states that just prior to her appointment while in the office she became very nauseated. She went to the restroom and produced "a lot of saliva" but denies full vomiting. She believes she was just nervous about her appointment today. Additionally she complains of headaches of unknown etiology. She notes that she doesn't always apply her eyedrops every day. She also states there is something like psoriasis on the top of her scalp. In clinic today her blood pressure is 136/76. However this morning at home it was 123XX123 systolic, which she attributes to rushing and getting ready for her appointment. She is planning to start working out at the gym with her husband. Lately she admits to some weight gain. It took a while to get used to her CPAP. Most recently she has a full face mask. A few weeks ago she became sick and was unable to use it. At other times she hasn't used it due to frequent sneezing or falling asleep on the couch. When she is able to use it she does feel better. She denies any palpitations, chest pain, shortness of breath, or peripheral edema. No lightheadedness, syncope, orthopnea, or PND.   Past Medical History:  Diagnosis Date   CAD in native artery 06/06/2020   CAD native.  Calcium score 176, 85th percentile 04/2020   Cataracts, bilateral    right eye - just watching   Chronic combined systolic and diastolic heart failure ([place]) 06/17/2021   Diabetes mellitus without complication ([place])  Diverticulitis    GERD (gastroesophageal reflux disease) 2006   Hearing loss    right ear - no hearing aid   Heart attack ([place]) 2008   Hypertension    Irregular heart beat 1973   OSA on CPAP 05/04/2022   Palpitations 11/30/2019   SVD (spontaneous vaginal delivery)    x 3    Past Surgical History:  Procedure Laterality Date   APPENDECTOMY  1967   BREAST SURGERY     cyst removed from right breast at age 36   [place] N/A 02/21/2018   Procedure: DILATATION & CURETTAGE/HYSTEROSCOPY WITH MYOSURE RESECTION OF ENDOMETRIAL POLYP;  Surgeon: Servando Salina, MD;  Location: [facility];  Service: Gynecology;  Laterality: N/A;   EXTERNAL EAR SURGERY  1974   TUBAL LIGATION  1977     Current Outpatient Medications  Medication Sig Dispense Refill   aspirin EC 81 MG tablet Take 1 tablet (81 mg total) by mouth daily. Swallow whole. 90 tablet 3   B COMPLEX-ZINC PO Take 1 tablet by mouth daily.     diclofenac Sodium (VOLTAREN) 1 % GEL Apply 2 g topically 4 (four) times daily. 100 g 3   ezetimibe (ZETIA) 10 MG tablet Take 1 tablet (10 mg total) by mouth daily. 90 tablet 3   loratadine (CLARITIN) 10 MG tablet Take 10 mg by mouth daily as needed for allergies.     nitroGLYCERIN (NITROSTAT) 0.4 MG SL tablet Place 1 tablet (0.4 mg total) under the tongue every 5 (five) minutes x 3 doses as needed for chest pain. 10 tablet 0   pantoprazole (PROTONIX) 20 MG tablet Take two tablets ('40mg'$ ) daily for two weeks. Then return to one tablet ('20mg'$ ) daily. 90 tablet 1   triamcinolone cream (KENALOG) 0.1 % Apply 1 Application topically daily as needed (on affected skin.). 30 g 1   No current facility-administered medications for this visit.    Allergies:   Lipitor [atorvastatin] and Sulfonamide derivatives    Social History:  The patient  reports that she quit smoking about 11 years ago. Her smoking use included cigarettes. She has a 30.00 pack-year smoking history. She has never used smokeless tobacco. She reports that she does not currently use alcohol. She reports that she does not use drugs.   Family History:  The patient's family history includes Asthma in her mother; COPD in her mother; Cancer in her maternal grandmother; Diabetes in her sister; Heart failure in her mother; Stroke in her mother.    ROS:   Please see the history of present illness. (+) Nausea (+) Anxiety (+) Headaches All other systems are reviewed  and negative.    PHYSICAL EXAM: VS:  BP 136/76 (BP Location: Right Arm, Patient Position: Sitting, Cuff Size: Large)   Pulse 89   Ht 5' 3.2" (1.605 m)   Wt 173 lb (78.5 kg)   SpO2 96%   BMI 30.45 kg/m  , BMI Body mass index is 30.45 kg/m. GENERAL:  Well appearing HEENT:  Pupils equal round and reactive, fundi not visualized, oral mucosa unremarkable NECK:  No jugular venous distention, waveform within normal limits, carotid upstroke brisk and symmetric, no bruits LUNGS:  Clear to auscultation bilaterally HEART:  RRR.  PMI not displaced or sustained,S1 and S2 within normal limits, no S3, no S4, no clicks, no rubs, no  murmurs ABD:  Flat, positive bowel sounds normal in frequency in pitch, no bruits, no  rebound, no guarding, no midline pulsatile mass, no hepatomegaly, no splenomegaly EXT:  2 plus pulses throughout, no edema, no cyanosis no clubbing SKIN:  No rashes no nodules NEURO:  Cranial nerves II through XII grossly intact, motor grossly intact throughout PSYCH:  Cognitively intact, oriented to person place and time   EKG:  EKG is personally reviewed. 05/04/2022:  EKG was not ordered. 06/06/2020: sinus rhythm.  Rate 67  bpm 11/30/2019: sinus rhythm.  Rate 72 bpm.  Non-specific T wave abnormalities.   Echo  01/21/2022:  1. Left ventricular ejection fraction, by estimation, is 55 to 60%. The  left ventricle has normal function. The left ventricle has no regional  wall motion abnormalities. Left ventricular diastolic parameters are  consistent with Grade I diastolic  dysfunction (impaired relaxation). The average left ventricular global  longitudinal strain is -15.8 %. The global longitudinal strain is  abnormal.   2. Right ventricular systolic function is normal. The right ventricular  size is normal. There is normal pulmonary artery systolic pressure. The  estimated right ventricular systolic pressure is 123XX123 mmHg.   3. The mitral valve is grossly normal. Trivial mitral valve   regurgitation.   4. The aortic valve is tricuspid. Aortic valve regurgitation is not  visualized. No aortic stenosis is present.   5. The inferior vena cava is normal in size with greater than 50%  respiratory variability, suggesting right atrial pressure of 3 mmHg.   Comparison(s): Prior images reviewed side by side. Compared to prior TTE  on 05/2021, the LVEF has improved from 45-50% to 55-60%.   Coronary CT  06/16/2021: IMPRESSION: 1. Minimal nonobstructive CAD, CADRADS = 2.   2. Coronary calcium score of 238. This was 87th percentile for age and sex matched control.   3. Normal coronary origin with left dominance.   4. Aortic atherosclerosis, with likely mural thrombus in abdominal aorta  04/18/20 Coronary calcium score IMPRESSION: Coronary calcium score of 176. This was 85th percentile for age and sex matched control.   Recommend aggressive risk factor modification, including LDL goal <70.  Recent Labs: 06/15/2021: B Natriuretic Peptide 163.8; TSH 1.538 06/16/2021: Magnesium 1.9 11/24/2021: ALT 16; Hemoglobin 13.4; Platelets 373 03/27/2022: BUN 13; Creatinine, Ser 0.81; Potassium 3.8; Sodium 142    Lipid Panel    Component Value Date/Time   CHOL 190 11/24/2021 1152   TRIG 137 11/24/2021 1152   HDL 41 11/24/2021 1152   CHOLHDL 4.6 (H) 11/24/2021 1152   CHOLHDL 6.0 06/15/2021 1717   VLDL 15 06/15/2021 1717   LDLCALC 124 (H) 11/24/2021 1152   LDLDIRECT 138 (H) 07/02/2011 1521      Wt Readings from Last 3 Encounters:  05/04/22 173 lb (78.5 kg)  03/27/22 171 lb (77.6 kg)  02/18/22 168 lb 2 oz (76.3 kg)     ASSESSMENT AND PLAN:  Chronic combined systolic and diastolic heart failure (HCC) Systolic function has normalized.  She is euvolemic and doing well.  Her blood pressure goal is less than 130/80.  It is nearly controlled but she prefers not to start any medications.  We have referred her to the PREP program at the [facility]  Work on increasing exercise to at least  150 minutes weekly.  Continue to work on limiting sodium intake.  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  She now has no unstable cardiac conditions.  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  Benign essential HTN Blood pressure nearly controlled as above.  Working on lifestyle changes.  Blood pressure goal less than 130/80.  Hyperlipidemia, unspecified LDL goal is less than 70 given her history of stroke.  Continue Zetia.  Recommended that she consider a PCSK9 inhibitor.  She politely declines.  Work on diet and exercise as above.   OSA on CPAP She has been using her CPAP as prescribed.    Disposition:   FU with Raea Magallon C. Oval Linsey, MD, [facility] in 6 months.   Medication Adjustments/Labs and Tests Ordered: Current medicines are reviewed at length with the patient today.  Concerns regarding medicines are outlined above.   Orders Placed This Encounter  Procedures   Amb Referral To Provider Referral Exercise Program (P.R.E.P)   No orders of the defined types were placed in this encounter.  Patient Instructions  Medication Instructions:  Your physician recommends that you continue on your current medications as directed. Please refer to the Current Medication list given to you today.   *If you need a refill on your cardiac medications before your next appointment, please call your pharmacy*  Lab Work: NONE  Testing/Procedures: NONE  Follow-Up: At [facility], you and your health needs are our priority.  As part of our continuing mission to provide you with exceptional heart care, we have created designated Provider Care Teams.  These Care Teams include your primary Cardiologist (physician) and Advanced Practice Providers (APPs -  Physician Assistants and Nurse Practitioners) who all work together to provide you with the care you need, when you need it.  We recommend signing up for the patient portal  called "MyChart".  Sign up information is provided on this After Visit Summary.  MyChart is used to connect with patients for Virtual Visits (Telemedicine).  Patients are able to view lab/test results, encounter notes, upcoming appointments, etc.  Non-urgent messages can be sent to your provider as well.   To learn more about what you can do with MyChart, go to [email or web address].    Your next appointment:   6 month(s)  Provider:   Skeet Latch, MD   YOU HAVE BEEN REFERRED TO THE PREP PROGRAM. IF YOU DO NOT HEAR FROM PAM OR [facility] IN 2 WEEKS CALL THE OFFICE TO FOLLOW UP    I,Mathew Stumpf,acting as a scribe for Skeet Latch, MD.,have documented all relevant documentation on the behalf of Skeet Latch, MD,as directed by  Skeet Latch, MD while in the presence of Skeet Latch, MD.  I, [place] Oval Linsey, MD have reviewed all documentation for this visit.  The documentation of the exam, diagnosis, procedures, and orders on 05/04/2022 are all accurate and complete.   Signed, Darrah Dredge C. Oval Linsey, MD, [facility]  05/04/2022 12:52 PM    [facility]

## 2022-05-04 NOTE — Telephone Encounter (Signed)
Completed, on pcp's desk for signature.

## 2022-05-05 ENCOUNTER — Telehealth: Payer: Self-pay | Admitting: Cardiovascular Disease

## 2022-05-05 NOTE — Telephone Encounter (Signed)
Pt is requesting a call back to discuss forms that need to be filled out. Requesting to speak to [place], [place].

## 2022-05-05 NOTE — Telephone Encounter (Signed)
Here ya go!

## 2022-05-05 NOTE — Telephone Encounter (Signed)
I called and spoke with patient. She is aware form is up front for pick up. Copy sent to scan.

## 2022-05-06 NOTE — Telephone Encounter (Signed)
Late entry-- Spoke with  patient yesterday regarding forms DOT She was unsure if she had given them the last Echo and office visit Did send those to number listed on forms Advised if any further issues to please call

## 2022-05-12 DIAGNOSIS — G4733 Obstructive sleep apnea (adult) (pediatric): Secondary | ICD-10-CM | POA: Diagnosis not present

## 2022-05-13 ENCOUNTER — Encounter: Payer: Self-pay | Admitting: *Deleted

## 2022-05-22 ENCOUNTER — Other Ambulatory Visit: Payer: Self-pay

## 2022-05-22 DIAGNOSIS — Z122 Encounter for screening for malignant neoplasm of respiratory organs: Secondary | ICD-10-CM

## 2022-05-22 DIAGNOSIS — Z87891 Personal history of nicotine dependence: Secondary | ICD-10-CM

## 2022-05-26 ENCOUNTER — Telehealth: Payer: Self-pay | Admitting: Family Medicine

## 2022-05-26 NOTE — Telephone Encounter (Signed)
I spoke with patient. Pt last had abx treatment back in February. Appt made for PCP to take a look and possible swab.

## 2022-05-26 NOTE — Telephone Encounter (Signed)
Went to ED for bronchial infection, antibiotics gave her a yeast infection, requesting something for treatment of yeast [id number] [place], [place] Phone: [phone]  Fax: [phone]

## 2022-05-26 NOTE — Progress Notes (Signed)
ACUTE VISIT Chief Complaint  Patient presents with   Vaginitis    Itching x 4 days   HPI: WendyWendy Mosley is a 72 y.o. female with PMHx significant for DM II,HTN,CAD,and HLD here today complaining of vaginal, external genitalia pruritus that has persisted for the past four days. She describes experiencing a small amount of vaginal discharge, but she explicitly denies any occurrence of vaginal bleeding or pelvic pain. The pruritus is specifically localized around pubic hair. She reports no noticeable swelling or redness, with the exception of areas she has been scratching.  Vaginal Itching The patient's primary symptoms include genital itching. The patient's pertinent negatives include no genital lesions, genital odor, genital rash or vaginal bleeding. This is a new problem. The current episode started in the past 7 days. The patient is experiencing no pain. Pertinent negatives include no abdominal pain, back pain, chills, discolored urine, fever, flank pain, headaches, nausea, rash, sore throat, urgency or vomiting. Nothing aggravates the symptoms. She is not sexually active.   She has utilized an over-the-counter product known as "Animator."   She has not taken antibiotics recently.  She also denies dysuria, gross hematuria,or increased urine frequency.  Review of Systems  Constitutional:  Negative for chills and fever.  HENT:  Negative for mouth sores and sore throat.   Gastrointestinal:  Negative for abdominal pain, nausea and vomiting.  Genitourinary:  Negative for flank pain and urgency.  Musculoskeletal:  Negative for back pain.  Skin:  Negative for rash.  Neurological:  Negative for numbness and headaches.  See other pertinent positives and negatives in HPI.  Current Outpatient Medications on File Prior to Visit  Medication Sig Dispense Refill   aspirin EC 81 MG tablet Take 1 tablet (81 mg total) by mouth daily. Swallow whole. 90 tablet 3   B COMPLEX-ZINC PO Take 1  tablet by mouth daily.     diclofenac Sodium (VOLTAREN) 1 % GEL Apply 2 g topically 4 (four) times daily. 100 g 3   ezetimibe (ZETIA) 10 MG tablet Take 1 tablet (10 mg total) by mouth daily. 90 tablet 3   loratadine (CLARITIN) 10 MG tablet Take 10 mg by mouth daily as needed for allergies.     nitroGLYCERIN (NITROSTAT) 0.4 MG SL tablet Place 1 tablet (0.4 mg total) under the tongue every 5 (five) minutes x 3 doses as needed for chest pain. 10 tablet 0   pantoprazole (PROTONIX) 20 MG tablet Take two tablets (40mg ) daily for two weeks. Then return to one tablet (20mg ) daily. 90 tablet 1   triamcinolone cream (KENALOG) 0.1 % Apply 1 Application topically daily as needed (on affected skin.). 30 g 1   No current facility-administered medications on file prior to visit.   Past Medical History:  Diagnosis Date   CAD in native artery 06/06/2020   CAD native.  Calcium score 176, 85th percentile 04/2020   Cataracts, bilateral    right eye - just watching   Chronic combined systolic and diastolic heart failure ([place]) 06/17/2021   Diabetes mellitus without complication ([place])    Diverticulitis    GERD (gastroesophageal reflux disease) 2006   Hearing loss    right ear - no hearing aid   Heart attack ([place]) 2008   Hypertension    Irregular heart beat 1973   OSA on CPAP 05/04/2022   Palpitations 11/30/2019   SVD (spontaneous vaginal delivery)    x 3   Allergies  Allergen Reactions   Lipitor [Atorvastatin] Other (See  Comments)    Hair loss   Sulfonamide Derivatives     Rash/itching    Social History   Socioeconomic History   Marital status: Married    Spouse name: Not on file   Number of children: Not on file   Years of education: Not on file   Highest education level: Not on file  Occupational History   Not on file  Tobacco Use   Smoking status: Former    Packs/day: 1.00    Years: 30.00    Additional pack years: 0.00    Total pack years: 30.00    Types: Cigarettes    Quit date:  02/01/2011    Years since quitting: 11.3   Smokeless tobacco: Never  Vaping Use   Vaping Use: Never used  Substance and Sexual Activity   Alcohol use: Not Currently    Comment: occasional glass of wine   Drug use: No    Comment: No IV drug   Sexual activity: Not Currently    Birth control/protection: Post-menopausal  Other Topics Concern   Not on file  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[place]   [address]   Legally Separated to Second Husband         Social Determinants of Health   Financial Resource Strain: Low Risk  (11/19/2021)   Overall Financial Resource Strain (CARDIA)    Difficulty of Paying Living Expenses: Not hard at all  Food Insecurity: Food Insecurity Present (11/20/2021)   Hunger Vital Sign    Worried About [place] in the Last Year: Sometimes true    Ran Out of Food in the Last Year: Sometimes true  Transportation Needs: No Transportation Needs (11/19/2021)   PRAPARE - Hydrologist (Medical): No    Lack of Transportation (Non-Medical): No  Physical Activity: Inactive (11/19/2021)   Exercise Vital Sign    Days of Exercise per Week: 0 days    Minutes of Exercise per Session: 0 min  Stress: No Stress Concern Present (11/19/2021)   [place]    Feeling of Stress : Not at all  Social Connections: Not on file   Vitals:   05/27/22 1616  BP: 130/80  Pulse: 82  Resp: 16  SpO2: 95%   Body mass index is 30.89 kg/m.  Physical Exam Vitals and nursing note reviewed.  Constitutional:      General: She is not in acute distress.    Appearance: She is well-developed.  HENT:     Head: Normocephalic and atraumatic.  Eyes:     Conjunctiva/sclera: Conjunctivae normal.  Cardiovascular:     Rate and Rhythm: Normal rate and regular rhythm.     Heart sounds: No murmur heard. Pulmonary:     Effort: Pulmonary effort is normal.  No respiratory distress.     Breath sounds: Normal breath sounds.  Abdominal:     Palpations: Abdomen is soft. There is no mass.     Tenderness: There is no abdominal tenderness.  Genitourinary:    Comments: Pelvic examination deferred for next visit if needed. Upon inspection of pubic area, I do not appreciate erythema, edema,or skin rash. Skin:    General: Skin is warm.  Neurological:     Mental Status: She is alert and oriented to person, place, and time.  Psychiatric:        Mood and Affect: Mood and affect normal.  ASSESSMENT AND PLAN:  Wendy Mosley was seen today for genital pruritus.  Pruritus of genital organs We discussed possible etiologies. Candidiasis,intertrigo,and lichen sclerosus (listed in her problem list). Recommend topical nystatin-triamcinolone externally bid for up to 14 days then prn. Cotton underwear and keeping area dry may help. Monitor for new symptoms.  -     Nystatin-Triamcinolone; Apply 1 Application topically 2 (two) times daily as needed.  Dispense: 45 g; Refill: 0  Vaginal discharge Reporting small amount. No risk factors for STD's. Recommend empiric treatment with Diflucan x 1. F/U as needed.  -     Fluconazole; Take 1 tablet (150 mg total) by mouth once for 1 dose.  Dispense: 1 tablet; Refill: 0  Return if symptoms worsen or fail to improve, for keep next appointment.  Siler Mavis G. [place], MD  [facility]. [place] office.

## 2022-05-27 ENCOUNTER — Encounter: Payer: Self-pay | Admitting: Family Medicine

## 2022-05-27 ENCOUNTER — Ambulatory Visit (INDEPENDENT_AMBULATORY_CARE_PROVIDER_SITE_OTHER): Payer: Medicare Other | Admitting: Family Medicine

## 2022-05-27 VITALS — BP 130/80 | HR 82 | Resp 16 | Ht 63.2 in | Wt 175.5 lb

## 2022-05-27 DIAGNOSIS — N898 Other specified noninflammatory disorders of vagina: Secondary | ICD-10-CM

## 2022-05-27 DIAGNOSIS — L293 Anogenital pruritus, unspecified: Secondary | ICD-10-CM

## 2022-05-27 MED ORDER — NYSTATIN-TRIAMCINOLONE 100000-0.1 UNIT/GM-% EX CREA: 1.0000 | TOPICAL_CREAM | Freq: Two times a day (BID) | CUTANEOUS | 0 refills | Status: DC | PRN

## 2022-05-27 MED ORDER — FLUCONAZOLE 150 MG PO TABS: 150.0000 mg | ORAL_TABLET | Freq: Once | ORAL | 0 refills | Status: AC

## 2022-05-27 NOTE — Patient Instructions (Signed)
A few things to remember from today's visit:  Pruritus of genital organs  Vaginal discharge - Plan: fluconazole (DIFLUCAN) 150 MG tablet, nystatin-triamcinolone (MYCOLOG II) cream  Will treat a yeast. If not better we will need a pelvic.  If you need refills for medications you take chronically, please call your pharmacy. Do not use My Chart to request refills or for acute issues that need immediate attention. If you send a my chart message, it may take a few days to be addressed, specially if I am not in the office.  Please be sure medication list is accurate. If a new problem present, please set up appointment sooner than planned today.

## 2022-05-29 ENCOUNTER — Encounter: Payer: Self-pay | Admitting: Family Medicine

## 2022-05-31 ENCOUNTER — Encounter: Payer: Self-pay | Admitting: Family Medicine

## 2022-06-12 DIAGNOSIS — G4733 Obstructive sleep apnea (adult) (pediatric): Secondary | ICD-10-CM | POA: Diagnosis not present

## 2022-06-17 ENCOUNTER — Ambulatory Visit (INDEPENDENT_AMBULATORY_CARE_PROVIDER_SITE_OTHER): Payer: Medicare Other | Admitting: Acute Care

## 2022-06-17 ENCOUNTER — Encounter: Payer: Self-pay | Admitting: Acute Care

## 2022-06-17 DIAGNOSIS — Z87891 Personal history of nicotine dependence: Secondary | ICD-10-CM | POA: Diagnosis not present

## 2022-06-17 NOTE — Patient Instructions (Signed)
Thank you for participating in the [facility]. It was our pleasure to meet you today. We will call you with the results of your scan within the next few days. Your scan will be assigned a Lung RADS category score by the physicians reading the scans.  This Lung RADS score determines follow up scanning.  See below for description of categories, and follow up screening recommendations. We will be in touch to schedule your follow up screening annually or based on recommendations of our providers. We will fax a copy of your scan results to your Primary Care Physician, or the physician who referred you to the program, to ensure they have the results. Please call the office if you have any questions or concerns regarding your scanning experience or results.  Our office number is [phone]. Please speak with Wendy Phelps, RN. , or  Wendy Buckner RN, They are  our Lung Cancer Screening RN.'s If They are unavailable when you call, Please leave a message on the voice mail. We will return your call at our earliest convenience.This voice mail is monitored several times a day.  Remember, if your scan is normal, we will scan you annually as long as you continue to meet the criteria for the program. (Age 50-80, Current smoker or smoker who has quit within the last 15 years). If you are a smoker, remember, quitting is the single most powerful action that you can take to decrease your risk of lung cancer and other pulmonary, breathing related problems. We know quitting is hard, and we are here to help.  Please let us know if there is anything we can do to help you meet your goal of quitting. If you are a former smoker, congratulations. We are proud of you! Remain smoke free! Remember you can refer friends or family members through the number above.  We will screen them to make sure they meet criteria for the program. Thank you for helping us take better care of you by  participating in Lung Screening.  You can receive free nicotine replacement therapy ( patches, gum or mints) by calling 1-800-QUIT NOW. Please call so we can get you on the path to becoming  a non-smoker. I know it is hard, but you can do this!  Lung RADS Categories:  Lung RADS 1: no nodules or definitely non-concerning nodules.  Recommendation is for a repeat annual scan in 12 months.  Lung RADS 2:  nodules that are non-concerning in appearance and behavior with a very low likelihood of becoming an active cancer. Recommendation is for a repeat annual scan in 12 months.  Lung RADS 3: nodules that are probably non-concerning , includes nodules with a low likelihood of becoming an active cancer.  Recommendation is for a 6-month repeat screening scan. Often noted after an upper respiratory illness. We will be in touch to make sure you have no questions, and to schedule your 6-month scan.  Lung RADS 4 A: nodules with concerning findings, recommendation is most often for a follow up scan in 3 months or additional testing based on our provider's assessment of the scan. We will be in touch to make sure you have no questions and to schedule the recommended 3 month follow up scan.  Lung RADS 4 B:  indicates findings that are concerning. We will be in touch with you to schedule additional diagnostic testing based on our provider's  assessment of the scan.  Other options for assistance in smoking cessation (   As covered by your insurance benefits)  Hypnosis for smoking cessation  [employer] [phone]  Acupuncture for smoking cessation  [facility] [phone]   

## 2022-06-17 NOTE — Progress Notes (Signed)
Virtual Visit via Telephone Note  I connected with Ashani Pumphrey Hufstedler on 06/17/22 at 11:30 AM EDT by telephone and verified that I am speaking with the correct person using two identifiers.  Location: Patient:  At home Provider:  [address], [place], [place], [address]    I discussed the limitations, risks, security and privacy concerns of performing an evaluation and management service by telephone and the availability of in person appointments. I also discussed with the patient that there may be a patient responsible charge related to this service. The patient expressed understanding and agreed to proceed.   Shared Decision Making Visit Lung Cancer Screening Program [phone])   Eligibility: Age 72 y.o. Pack Years Smoking History Calculation 56 pack year smoking history (# packs/per year x # years smoked) Recent History of coughing up blood  no Unexplained weight loss? no ( >Than 15 pounds within the last 6 months ) Prior History Lung / other cancer no (Diagnosis within the last 5 years already requiring surveillance chest CT Scans). Smoking Status Former Smoker Former Smokers: Years since quit: 13 years  Quit Date: 02/01/2011  Visit Components: Discussion included one or more decision making aids. yes Discussion included risk/benefits of screening. yes Discussion included potential follow up diagnostic testing for abnormal scans. yes Discussion included meaning and risk of over diagnosis. yes Discussion included meaning and risk of False Positives. yes Discussion included meaning of total radiation exposure. yes  Counseling Included: Importance of adherence to annual lung cancer LDCT screening. yes Impact of comorbidities on ability to participate in the program. yes Ability and willingness to under diagnostic treatment. yes  Smoking Cessation Counseling: Current Smokers:  Discussed importance of smoking cessation. yes Information about tobacco cessation classes and  interventions provided to patient. yes Patient provided with "ticket" for LDCT Scan. yes Symptomatic Patient. no  Counseling NA Diagnosis Code: Tobacco Use Z72.0 Asymptomatic Patient yes  Counseling (Intermediate counseling: > three minutes counseling) U9811 Former Smokers:  Discussed the importance of maintaining cigarette abstinence. yes Diagnosis Code: Personal History of Nicotine Dependence. B14.782 Information about tobacco cessation classes and interventions provided to patient. Yes Patient provided with "ticket" for LDCT Scan. yes Written Order for Lung Cancer Screening with LDCT placed in Epic. Yes (CT Chest Lung Cancer Screening Low Dose W/O CM) [id number] Z12.2-Screening of respiratory organs Z87.891-Personal history of nicotine dependence  I spent 25 minutes of face to face time/virtual visit time  with  Ms. Quiggle discussing the risks and benefits of lung cancer screening. We took the time to pause the power point at intervals to allow for questions to be asked and answered to ensure understanding. We discussed that she had taken the single most powerful action possible to decrease her risk of developing lung cancer when she quit smoking. I counseled her to remain smoke free, and to contact me if she ever had the desire to smoke again so that I can provide resources and tools to help support the effort to remain smoke free. We discussed the time and location of the scan, and that either  Abigail Miyamoto RN, Karlton Lemon, RN or I  or I will call / send a letter with the results within  24-72 hours of receiving them. She has the office contact information in the event she needs to speak with me,  she verbalized understanding of all of the above and had no further questions upon leaving the office.     I explained to the patient that there has  been a high incidence of coronary artery disease noted on these exams. I explained that this is a non-gated exam therefore degree or severity cannot  be determined. This patient is not on statin therapy. I have asked the patient to follow-up with their PCP regarding any incidental finding of coronary artery disease and management with diet or medication as they feel is clinically indicated. The patient verbalized understanding of the above and had no further questions.     Bevelyn Ngo, NP 06/17/2022

## 2022-06-24 ENCOUNTER — Telehealth: Payer: Self-pay | Admitting: *Deleted

## 2022-06-24 ENCOUNTER — Ambulatory Visit
Admission: RE | Admit: 2022-06-24 | Discharge: 2022-06-24 | Disposition: A | Discharge status: Home / Self Care | Payer: Medicare Other | Source: Ambulatory Visit | Attending: Acute Care | Admitting: Acute Care

## 2022-06-24 DIAGNOSIS — Z122 Encounter for screening for malignant neoplasm of respiratory organs: Secondary | ICD-10-CM

## 2022-06-24 DIAGNOSIS — Z87891 Personal history of nicotine dependence: Secondary | ICD-10-CM

## 2022-06-24 DIAGNOSIS — J432 Centrilobular emphysema: Secondary | ICD-10-CM | POA: Diagnosis not present

## 2022-06-24 DIAGNOSIS — I2510 Atherosclerotic heart disease of native coronary artery without angina pectoris: Secondary | ICD-10-CM | POA: Diagnosis not present

## 2022-06-24 NOTE — Telephone Encounter (Signed)
error 

## 2022-06-24 NOTE — Telephone Encounter (Signed)
Contacted regarding PREP Class referral. Left voice message to return call for more information. 

## 2022-06-29 ENCOUNTER — Other Ambulatory Visit: Payer: Self-pay | Admitting: Acute Care

## 2022-06-29 DIAGNOSIS — Z87891 Personal history of nicotine dependence: Secondary | ICD-10-CM

## 2022-06-29 DIAGNOSIS — Z122 Encounter for screening for malignant neoplasm of respiratory organs: Secondary | ICD-10-CM

## 2022-07-12 DIAGNOSIS — G4733 Obstructive sleep apnea (adult) (pediatric): Secondary | ICD-10-CM | POA: Diagnosis not present

## 2022-07-16 ENCOUNTER — Other Ambulatory Visit (HOSPITAL_BASED_OUTPATIENT_CLINIC_OR_DEPARTMENT_OTHER): Payer: Self-pay | Admitting: Family Medicine

## 2022-07-22 ENCOUNTER — Telehealth: Payer: Self-pay | Admitting: Family Medicine

## 2022-07-22 NOTE — Progress Notes (Unsigned)
ACUTE VISIT Chief Complaint  Patient presents with   Vaginitis   HPI: Wendy Mosley is a 72 y.o. female, who is here today complaining of recurrent external genital itching. She reports that the pruritus is localized externally . She notes a white, odorless vaginal discharge but denies any bleeding.  She confirms that she is not sexually active.  She mentions using various creams for her symptoms, including a recent use of Triamcinolone, which provided slight relief. She also used cortisone 10 and Benadryl cream without significant improvement.  Previously, she was treated empirically for similar symptoms with Nystatin-Triamcinolone cream during a visit in March 2024, which she did not feel like it helped. She also states that she checked for lice.  She has a medical history of lichen sclerosus, she does not recall last visit with gynecologist.  DM II: Dx'ed in 2022.  She is on non pharmacologic treatment. She is not checking BS's. Negative for symptoms of hypoglycemia, polyuria, polydipsia, numbness extremities, foot ulcers/trauma   Lab Results  Component Value Date   HGBA1C 6.3 (H) 11/24/2021   Lab Results  Component Value Date   LABMICR 14.1 11/24/2021   MICROALBUR <0.7 02/18/2022   MICROALBUR 80 10/25/2019   Aortic atherosclerosis, she has not tolerated statins. Currently she is on Zetia 10 mg daily, which she is tolerating well. Lab Results  Component Value Date   CHOL 190 11/24/2021   HDL 41 11/24/2021   LDLCALC 124 (H) 11/24/2021   LDLDIRECT 138 (H) 07/02/2011   TRIG 137 11/24/2021   CHOLHDL 4.6 (H) 11/24/2021   She also brings up a long-standing issue of intermittent pelvic pain, which she speculates might be related to fibroids, dx'ed years ago. She has not identified exacerbating or alleviating factors. Negative for urinary symptoms or changes in bowel habits.  Review of Systems  Constitutional:  Negative for chills and fever.  Respiratory:  Negative  for cough, shortness of breath and wheezing.   Cardiovascular:  Negative for chest pain, palpitations and leg swelling.  Gastrointestinal:  Negative for nausea and vomiting.  Endocrine: Negative for cold intolerance and heat intolerance.  Genitourinary:  Negative for decreased urine volume, dysuria and hematuria.  Skin:  Negative for rash.  Neurological:  Negative for syncope and weakness.  See other pertinent positives and negatives in HPI.  Current Outpatient Medications on File Prior to Visit  Medication Sig Dispense Refill   aspirin EC 81 MG tablet Take 1 tablet (81 mg total) by mouth daily. Swallow whole. 90 tablet 3   B COMPLEX-ZINC PO Take 1 tablet by mouth daily.     diclofenac Sodium (VOLTAREN) 1 % GEL Apply 2 g topically 4 (four) times daily. 100 g 3   ezetimibe (ZETIA) 10 MG tablet TAKE 1 TABLET BY MOUTH EVERY DAY 90 tablet 3   loratadine (CLARITIN) 10 MG tablet Take 10 mg by mouth daily as needed for allergies.     nitroGLYCERIN (NITROSTAT) 0.4 MG SL tablet Place 1 tablet (0.4 mg total) under the tongue every 5 (five) minutes x 3 doses as needed for chest pain. 10 tablet 0   pantoprazole (PROTONIX) 20 MG tablet Take two tablets (40mg ) daily for two weeks. Then return to one tablet (20mg ) daily. 90 tablet 1   triamcinolone cream (KENALOG) 0.1 % Apply 1 Application topically daily as needed (on affected skin.). 30 g 1   No current facility-administered medications on file prior to visit.   Past Medical History:  Diagnosis Date   CAD  in native artery 06/06/2020   CAD native.  Calcium score 176, 85th percentile 04/2020   Cataracts, bilateral    right eye - just watching   Chronic combined systolic and diastolic heart failure (HCC) 06/17/2021   Diabetes mellitus without complication (HCC)    Diverticulitis    GERD (gastroesophageal reflux disease) 2006   Hearing loss    right ear - no hearing aid   Heart attack (HCC) 2008   Hypertension    Irregular heart beat 1973   OSA on  CPAP 05/04/2022   Palpitations 11/30/2019   SVD (spontaneous vaginal delivery)    x 3   Allergies  Allergen Reactions   Lipitor [Atorvastatin] Other (See Comments)    Hair loss   Sulfonamide Derivatives     Rash/itching    Social History   Socioeconomic History   Marital status: Married    Spouse name: Not on file   Number of children: Not on file   Years of education: Not on file   Highest education level: Not on file  Occupational History   Not on file  Tobacco Use   Smoking status: Former    Packs/day: 1.50    Years: 37.00    Additional pack years: 0.00    Total pack years: 55.50    Types: Cigarettes    Quit date: 02/01/2011    Years since quitting: 11.4   Smokeless tobacco: Never  Vaping Use   Vaping Use: Never used  Substance and Sexual Activity   Alcohol use: Not Currently    Comment: occasional glass of wine   Drug use: No    Comment: No IV drug   Sexual activity: Not Currently    Birth control/protection: Post-menopausal  Other Topics Concern   Not on file  Social History Narrative   Armed forces operational officer   Lives in [place]   3 Grown Children   Legally Separated to Second Husband         Social Determinants of Health   Financial Resource Strain: Low Risk  (11/19/2021)   Overall Financial Resource Strain (CARDIA)    Difficulty of Paying Living Expenses: Not hard at all  Food Insecurity: Food Insecurity Present (11/20/2021)   Hunger Vital Sign    Worried About Running Out of Food in the Last Year: Sometimes true    Ran Out of Food in the Last Year: Sometimes true  Transportation Needs: No Transportation Needs (11/19/2021)   PRAPARE - Administrator, Civil Service (Medical): No    Lack of Transportation (Non-Medical): No  Physical Activity: Inactive (11/19/2021)   Exercise Vital Sign    Days of Exercise per Week: 0 days    Minutes of Exercise per Session: 0 min  Stress: No Stress Concern Present (11/19/2021)   [employer] of  Occupational Health - Occupational Stress Questionnaire    Feeling of Stress : Not at all  Social Connections: Not on file   Vitals:   07/24/22 1219  BP: 120/80  Pulse: 95  Resp: 16  SpO2: 96%   Body mass index is 30.28 kg/m.  Physical Exam Vitals and nursing note reviewed. Exam conducted with a chaperone present.  Constitutional:      General: She is not in acute distress.    Appearance: She is well-developed.  HENT:     Head: Normocephalic and atraumatic.  Eyes:     Conjunctiva/sclera: Conjunctivae normal.  Cardiovascular:     Rate and Rhythm: Normal rate and regular rhythm.  Heart sounds: No murmur heard.    Comments: DP pulses palpable. Pulmonary:     Effort: Pulmonary effort is normal. No respiratory distress.     Breath sounds: Normal breath sounds.  Abdominal:     Palpations: Abdomen is soft. There is no mass.     Tenderness: There is abdominal tenderness in the suprapubic area. There is no guarding or rebound.  Genitourinary:    Exam position: Lithotomy position.     Labia:        Right: No rash, tenderness or lesion.        Left: No rash, tenderness or lesion.      Vagina: Prolapsed vaginal walls (mild anterior prolapse) present. No erythema or bleeding.     Cervix: Discharge present. No cervical motion tenderness, friability, lesion, erythema or cervical bleeding.     Uterus: Tender. Not enlarged.      Adnexa:        Right: No mass or fullness.         Left: No mass or fullness.    Lymphadenopathy:     Cervical: No cervical adenopathy.     Lower Body: No right inguinal adenopathy. No left inguinal adenopathy.  Skin:    General: Skin is warm.     Findings: No erythema or rash.  Neurological:     General: No focal deficit present.     Mental Status: She is alert and oriented to person, place, and time.     Cranial Nerves: No cranial nerve deficit.     Gait: Gait normal.  Psychiatric:        Mood and Affect: Mood and affect normal.   ASSESSMENT AND  PLAN:  Wendy Mosley was seen today for vaginitis.  Diagnoses and all orders for this visit: Lab Results  Component Value Date   HGBA1C 6.2 07/24/2022   Pruritus of genital organs Seems to be a chronic problem. Hx and examination do not suggest a serious process.Prior treatments have not help. Per records, hx of lichen sclerosus. Recommend clobetasol oint, small amount at night for for 30 days. Side effects of medication discussed. Monitor for new symptoms. Will consider gyn evaluation if problem is persistent.  -     Clobetasol Propionate; Apply 5 mm from tube opening, applied from the distal skin crease of the index finger to the tip at night for 30 days.  Dispense: 30 g; Refill: 1  Atherosclerosis of aorta (HCC) Assessment & Plan: Continue Zetia 10 mg daily and low fat diet. Last LDL 124 in 10/2021. She is not fasting today, will plan on FLP next visit.  Type 2 diabetes mellitus with other specified complication, without long-term current use of insulin (HCC) Assessment & Plan: HgA1C at goal, 6.2 today. Continue non pharmacologic treatment. Annual eye exam, periodic dental and foot care recommended. F/U in 6 months.  Orders: -     POCT glycosylated hemoglobin (Hb A1C)  Vaginal discharge Denies risk factors for STD's. ?Physiologic, candidiasis among some to consider. Further recommendations according to lab results.  -     Cervicovaginal ancillary only  Pelvic pain Chronic. We discussed possible etiologies. Colonoscopy in 08/2019. Pelvic/transvaginal US will be arranged.  -     US PELVIC COMPLETE WITH TRANSVAGINAL; Future  I spent a total of 42 minutes in both face to face and non face to face activities for this visit on the date of this encounter. During this time history was obtained and documented, examination was performed, prior labs reviewed, and  assessment/plan discussed.  Return in about 6 months (around 01/24/2023) for chronic problems.  Kavontae Pritchard G. [place],  MD  [facility]. [place] office.

## 2022-07-23 NOTE — Telephone Encounter (Signed)
error 

## 2022-07-24 ENCOUNTER — Other Ambulatory Visit (HOSPITAL_COMMUNITY)
Admission: RE | Admit: 2022-07-24 | Discharge: 2022-07-24 | Disposition: A | Discharge status: Home / Self Care | Payer: Medicare Other | Source: Ambulatory Visit | Attending: Family Medicine | Admitting: Family Medicine

## 2022-07-24 ENCOUNTER — Ambulatory Visit (INDEPENDENT_AMBULATORY_CARE_PROVIDER_SITE_OTHER): Payer: Medicare Other | Admitting: Family Medicine

## 2022-07-24 ENCOUNTER — Encounter: Payer: Self-pay | Admitting: Family Medicine

## 2022-07-24 VITALS — BP 120/80 | HR 95 | Resp 16 | Ht 63.2 in | Wt 172.0 lb

## 2022-07-24 DIAGNOSIS — E1169 Type 2 diabetes mellitus with other specified complication: Secondary | ICD-10-CM

## 2022-07-24 DIAGNOSIS — N898 Other specified noninflammatory disorders of vagina: Secondary | ICD-10-CM | POA: Insufficient documentation

## 2022-07-24 DIAGNOSIS — I700 Atherosclerosis of aorta: Secondary | ICD-10-CM | POA: Diagnosis not present

## 2022-07-24 DIAGNOSIS — R102 Pelvic and perineal pain: Secondary | ICD-10-CM

## 2022-07-24 DIAGNOSIS — L293 Anogenital pruritus, unspecified: Secondary | ICD-10-CM

## 2022-07-24 DIAGNOSIS — L292 Pruritus vulvae: Secondary | ICD-10-CM

## 2022-07-24 LAB — POCT GLYCOSYLATED HEMOGLOBIN (HGB A1C): HbA1c, POC (prediabetic range): 6.2 % (ref 5.7–6.4)

## 2022-07-24 MED ORDER — CLOBETASOL PROPIONATE 0.05 % EX OINT: TOPICAL_OINTMENT | CUTANEOUS | 1 refills | Status: DC

## 2022-07-24 NOTE — Patient Instructions (Addendum)
A few things to remember from today's visit:  Atherosclerosis of aorta (HCC)  Type 2 diabetes mellitus with other specified complication, without long-term current use of insulin (HCC), Chronic - Plan: POC HgB A1c  Vaginal discharge - Plan: Cervicovaginal ancillary only( [place])  Vulvar pruritus  Pelvic pain - Plan: US PELVIC COMPLETE WITH TRANSVAGINAL  Apply small amount of clobetasol at night for 30 days. Pelvic ultrasound is being arranged.  If you need refills for medications you take chronically, please call your pharmacy. Do not use My Chart to request refills or for acute issues that need immediate attention. If you send a my chart message, it may take a few days to be addressed, specially if I am not in the office.  Please be sure medication list is accurate. If a new problem present, please set up appointment sooner than planned today.

## 2022-07-25 NOTE — Assessment & Plan Note (Signed)
HgA1C at goal, 6.2 today. Continue non pharmacologic treatment. Annual eye exam, periodic dental and foot care recommended. F/U in 6 months.

## 2022-07-25 NOTE — Assessment & Plan Note (Signed)
Continue Zetia 10 mg daily and low fat diet. Last LDL 124 in 10/2021. She is not fasting today, will plan on FLP next visit.

## 2022-07-28 LAB — CERVICOVAGINAL ANCILLARY ONLY
Bacterial Vaginitis (gardnerella): NEGATIVE
Candida Glabrata: NEGATIVE
Candida Vaginitis: NEGATIVE
Chlamydia: NEGATIVE
Comment: NEGATIVE
Comment: NEGATIVE
Comment: NEGATIVE
Comment: NEGATIVE
Comment: NEGATIVE
Comment: NORMAL
Neisseria Gonorrhea: NEGATIVE
Trichomonas: NEGATIVE

## 2022-08-12 DIAGNOSIS — G4733 Obstructive sleep apnea (adult) (pediatric): Secondary | ICD-10-CM | POA: Diagnosis not present

## 2022-08-24 ENCOUNTER — Ambulatory Visit
Admission: RE | Admit: 2022-08-24 | Discharge: 2022-08-24 | Disposition: A | Discharge status: Home / Self Care | Payer: Medicare Other | Source: Ambulatory Visit | Attending: Family Medicine | Admitting: Family Medicine

## 2022-08-24 DIAGNOSIS — R102 Pelvic and perineal pain: Secondary | ICD-10-CM

## 2022-08-31 ENCOUNTER — Other Ambulatory Visit: Payer: Self-pay | Admitting: Family Medicine

## 2022-08-31 DIAGNOSIS — R102 Pelvic and perineal pain: Secondary | ICD-10-CM

## 2022-08-31 DIAGNOSIS — D251 Intramural leiomyoma of uterus: Secondary | ICD-10-CM

## 2022-09-11 DIAGNOSIS — G4733 Obstructive sleep apnea (adult) (pediatric): Secondary | ICD-10-CM | POA: Diagnosis not present

## 2022-09-17 DIAGNOSIS — Z961 Presence of intraocular lens: Secondary | ICD-10-CM | POA: Diagnosis not present

## 2022-09-17 DIAGNOSIS — E119 Type 2 diabetes mellitus without complications: Secondary | ICD-10-CM | POA: Diagnosis not present

## 2022-09-17 DIAGNOSIS — H43393 Other vitreous opacities, bilateral: Secondary | ICD-10-CM | POA: Diagnosis not present

## 2022-09-17 DIAGNOSIS — H04123 Dry eye syndrome of bilateral lacrimal glands: Secondary | ICD-10-CM | POA: Diagnosis not present

## 2022-09-17 LAB — HM DIABETES EYE EXAM

## 2022-10-10 DIAGNOSIS — G4733 Obstructive sleep apnea (adult) (pediatric): Secondary | ICD-10-CM | POA: Diagnosis not present

## 2022-10-12 DIAGNOSIS — G4733 Obstructive sleep apnea (adult) (pediatric): Secondary | ICD-10-CM | POA: Diagnosis not present

## 2022-11-04 ENCOUNTER — Ambulatory Visit (INDEPENDENT_AMBULATORY_CARE_PROVIDER_SITE_OTHER): Payer: Medicare Other | Admitting: Cardiovascular Disease

## 2022-11-04 ENCOUNTER — Encounter (HOSPITAL_BASED_OUTPATIENT_CLINIC_OR_DEPARTMENT_OTHER): Payer: Self-pay | Admitting: Cardiovascular Disease

## 2022-11-04 VITALS — BP 136/74 | HR 82 | Ht 63.2 in | Wt 176.9 lb

## 2022-11-04 DIAGNOSIS — I10 Essential (primary) hypertension: Secondary | ICD-10-CM

## 2022-11-04 DIAGNOSIS — I471 Supraventricular tachycardia, unspecified: Secondary | ICD-10-CM | POA: Diagnosis not present

## 2022-11-04 DIAGNOSIS — G4733 Obstructive sleep apnea (adult) (pediatric): Secondary | ICD-10-CM | POA: Diagnosis not present

## 2022-11-04 DIAGNOSIS — E785 Hyperlipidemia, unspecified: Secondary | ICD-10-CM | POA: Diagnosis not present

## 2022-11-04 DIAGNOSIS — I2510 Atherosclerotic heart disease of native coronary artery without angina pectoris: Secondary | ICD-10-CM

## 2022-11-04 DIAGNOSIS — I5042 Chronic combined systolic (congestive) and diastolic (congestive) heart failure: Secondary | ICD-10-CM | POA: Diagnosis not present

## 2022-11-04 DIAGNOSIS — R1013 Epigastric pain: Secondary | ICD-10-CM | POA: Diagnosis not present

## 2022-11-04 MED ORDER — PANTOPRAZOLE SODIUM 40 MG PO TBEC: 40.0000 mg | DELAYED_RELEASE_TABLET | Freq: Every day | ORAL | 1 refills | Status: DC

## 2022-11-04 NOTE — Patient Instructions (Signed)
Medication Instructions:  PANTOPRAZOLE 40 MG DAILY SENT [facility]  *If you need a refill on your cardiac medications before your next appointment, please call your pharmacy*  Lab Work: FASTING LP/CMET IN [place] A FEW DAYS PRIOR TO FOLLOW UP   If you have labs (blood work) drawn today and your tests are completely normal, you will receive your results only by: MyChart Message (if you have MyChart) OR A paper copy in the mail If you have any lab test that is abnormal or we need to change your treatment, we will call you to review the results.  Testing/Procedures: NONE  Follow-Up: At [facility], you and your health needs are our priority.  As part of our continuing mission to provide you with exceptional heart care, we have created designated Provider Care Teams.  These Care Teams include your primary Cardiologist (physician) and Advanced Practice Providers (APPs -  Physician Assistants and Nurse Practitioners) who all work together to provide you with the care you need, when you need it.  We recommend signing up for the patient portal called "MyChart".  Sign up information is provided on this After Visit Summary.  MyChart is used to connect with patients for Virtual Visits (Telemedicine).  Patients are able to view lab/test results, encounter notes, upcoming appointments, etc.  Non-urgent messages can be sent to your provider as well.   To learn more about what you can do with MyChart, go to [email or web address].    Your next appointment:   4 month(s)  Provider:   Chilton Si, MD or Gillian Shields, NP    Other Instructions MONITOR YOUR BLOOD PRESSURE TWICE A DAY FOR 1 WEEK PRIOR TO FOLLOW UP. BRING READINGS AND MACHINE TO FOLLOW UP

## 2022-11-04 NOTE — Progress Notes (Signed)
Cardiology Office Note:  .   Date:  11/04/2022  ID:  Wendy Mosley, DOB [birth year], MRN [id number] PCP: [place], Betty G, MD  [facility] Providers Cardiologist:  Chilton Si, MD    History of Present Illness: .   Wendy Mosley is a 72 y.o. female with chronic systolic heart failure with recovered LVEF, hypertension, hyperlipidemia, prior stroke, and prior tobacco abuse who is being seen today for follow-up. She was initially seen 9/31/2021 for the evaluation of palpitations at the request of Arnette Felts, FNP.  She saw [name] on 10/25/2019 he reported episodes of racing heart.  EKG at the time revealed sinus rhythm with no arrhythmias.  Thyroid function, CMP and CBC were unremarkable.  One night she laid down on the sofa and her heart started racing. It wouldn't stop with positional changes.  The episode lasted for several minutes and she felt short of breath.  She noted slight pain in her head at the time.  She also noted some lightheadedness and mild nausea.    Her blood pressure and cholesterol are poorly controlled.  However she was hesitant to start any medications.  She tried several medications in the past and didn't tolerate them.   She wanted to work on diet and exercise.  She struggled with depression after her daughter died from cancer 03/31/2020.   She was hospitalized 05/2021 with weakness and left-sided numbness. She was in SVT in the 150s and hypotensive to 81/59. MRI showed an old stroke but no acute findings. Troponin was elevated, and she had a coronary CTA that revealed minimal, nonobstructive disease and calcium score 238 which was 87th percentile. Echo revealed LVEF 45-50% with global hypokinesis and grade 1 diastolic dysfunction. She had a sleep study that was positive for sleep apnea. She was started on Jardiance but did not tolerate it. She last saw Gillian Shields, NP 12/2021 and hesitantly agreed to start using her CPAP. She had a repeat echo 12/2021 that revealed  LVEF 55-60% and grade 1 diastolic dysfunction.  Echo appointment 05/2022 she was feeling very anxious.  Blood pressures were uncontrolled both at home and in the office.  She was struggling to get used to CPAP.  She was referred to the [facility].  Recommended PCSK9 inhibitor and she declined.  ROS:   Studies Reviewed: [name]       EKG 11/04/22: Sinus rhythm.  Rate 84 bpm.   Risk Assessment/Calculations:         STOP-Bang Score:         Physical Exam:    VS:  BP 136/74   Pulse 82   Ht 5' 3.2" (1.605 m)   Wt 176 lb 14.4 oz (80.2 kg)   BMI 31.14 kg/m  , BMI Body mass index is 31.14 kg/m. GENERAL:  Well appearing HEENT: Pupils equal round and reactive, fundi not visualized, oral mucosa unremarkable NECK:  No jugular venous distention, waveform within normal limits, carotid upstroke brisk and symmetric, no bruits, no thyromegaly LUNGS:  Clear to auscultation bilaterally HEART:  RRR.  PMI not displaced or sustained,S1 and S2 within normal limits, no S3, no S4, no clicks, no rubs, no murmurs ABD:  Flat, positive bowel sounds normal in frequency in pitch, no bruits, no rebound, no guarding, no midline pulsatile mass, no hepatomegaly, no splenomegaly EXT:  2 plus pulses throughout, no edema, no cyanosis no clubbing SKIN:  No rashes no nodules NEURO:  Cranial nerves II through XII grossly intact, motor grossly  intact throughout PSYCH:  Cognitively intact, oriented to person place and time   ASSESSMENT AND PLAN: .    # Gastroesophageal Reflux Disease (GERD) Reports worsening symptoms over the past few weeks. Has been off Protonix for about a month. -Refill Protonix prescription.  # Obesity Reports weight gain and difficulty fitting into clothes. Expresses desire to lose weight but struggles with finding time for exercise due to work schedule. -Encouraged to increase physical activity, possibly with husband. -Consideration of healthier dietary choices, including more plant-based  meals.  # Hyperlipidemia Last cholesterol check was last year and was not optimal. Patient has mild to moderate blockages in coronary arteries per previous CT scan. Patient has difficulty tolerating statins. -Continue current regimen of aspirin and Zetia. -Consider other cholesterol-lowering options such as Repatha, Praluent, or Inclisiran. -Check cholesterol levels in January.  # Hypertension Blood pressure in office was 136/74. Patient reports lower readings at home. -Encouraged to monitor blood pressure at home and bring readings to next appointment.  # Sleep Apnea Patient reports discontinuing use of CPAP machine due to coughing. Reports feeling rested upon waking. -No changes to treatment plan at this time. Monitor symptoms.      Dispo: F/u in 4 months  Signed, Chilton Si, MD

## 2022-11-25 ENCOUNTER — Encounter: Payer: Medicare HMO | Admitting: Nurse Practitioner

## 2022-12-08 ENCOUNTER — Telehealth: Payer: Self-pay | Admitting: Cardiovascular Disease

## 2022-12-08 NOTE — Telephone Encounter (Signed)
Pt is requesting a callback regarding her having some questions she has about cpap. Please advise

## 2022-12-08 NOTE — Telephone Encounter (Signed)
Please call patient to discuss CPAP concerns

## 2022-12-09 ENCOUNTER — Ambulatory Visit: Payer: Self-pay | Admitting: Nurse Practitioner

## 2022-12-25 NOTE — Telephone Encounter (Signed)
Reached out patient left message to call back.

## 2022-12-29 NOTE — Telephone Encounter (Signed)
Return Call: Patient called back to say she thought the problem she was having was coming from her cpap but it turned out to be black mold in her home. She will restart her sleep therapy now that the black mold has been cleaned up.

## 2023-02-12 ENCOUNTER — Encounter: Payer: Self-pay | Admitting: Family Medicine

## 2023-02-12 ENCOUNTER — Ambulatory Visit (INDEPENDENT_AMBULATORY_CARE_PROVIDER_SITE_OTHER): Payer: Medicare Other | Admitting: Family Medicine

## 2023-02-12 VITALS — BP 110/70 | HR 88 | Temp 99.8°F | Resp 16 | Ht 63.2 in | Wt 169.0 lb

## 2023-02-12 DIAGNOSIS — M79651 Pain in right thigh: Secondary | ICD-10-CM | POA: Diagnosis not present

## 2023-02-12 DIAGNOSIS — R229 Localized swelling, mass and lump, unspecified: Secondary | ICD-10-CM

## 2023-02-12 DIAGNOSIS — J069 Acute upper respiratory infection, unspecified: Secondary | ICD-10-CM | POA: Diagnosis not present

## 2023-02-12 DIAGNOSIS — J029 Acute pharyngitis, unspecified: Secondary | ICD-10-CM

## 2023-02-12 LAB — POCT INFLUENZA A/B
Influenza A, POC: NEGATIVE
Influenza B, POC: NEGATIVE

## 2023-02-12 LAB — POC COVID19 BINAXNOW: SARS Coronavirus 2 Ag: NEGATIVE

## 2023-02-12 LAB — POCT RAPID STREP A (OFFICE): Rapid Strep A Screen: NEGATIVE

## 2023-02-12 MED ORDER — DOXYCYCLINE HYCLATE 100 MG PO TABS: 100.0000 mg | ORAL_TABLET | Freq: Two times a day (BID) | ORAL | 0 refills | Status: AC

## 2023-02-12 NOTE — Progress Notes (Signed)
ACUTE VISIT Chief Complaint  Patient presents with   Cough    Started on Tuesday, body chills, aches & dizziness   Dizziness   HPI: [name] is a 72 y.o. female with a PMHx significant for HTN, CAD, SVT, OSA on CPAP, GERD, DM II, HLD, statin myopathy, and chronic right shoulder pain, among some, who is here today complaining of cough, body aches, chills, and dizziness.   Patient complains of cough, body aches, chills, and dizziness for 3 days. She mentions her symptoms began shortly after she went out to eat at a [employer].  Cough This is a new problem. The current episode started in the past 7 days. The problem has been unchanged. The problem occurs every few hours. The cough is Productive of sputum. Associated symptoms include chills, ear congestion, a fever, nasal congestion and postnasal drip. Pertinent negatives include no chest pain, heartburn, hemoptysis or wheezing. Nothing aggravates the symptoms. She has tried OTC cough suppressant for the symptoms. The treatment provided mild relief.   Her fever was 100.8 F at its highest, taken with a skin thermometer. Her cough is productive but she has not had any blood in her sputum.  Endorses associated rhinorrhea, nasal congestion, and nausea.  Minimal sore throat.  She has been taking dayquil, vicks vapocool, and tylenol for her symptoms.   Pertinent negatives include  wheezing, SOB, chest pain, vomiting, diarrhea, urinary changes, or known sick contacts.   DM II:  She has not checked her blood sugar for a month or two.  Lab Results  Component Value Date   HGBA1C 6.2 07/24/2022   Right upper thigh pain:  Patient also complains of a new onset of pain right under right groin for about a month. She says the sometimes pain radiates down to her ankle. Negative for calves pain,claudication like symptoms. She also endorses some occasional numbness in her right great toe, chronic.  A few nights ago, she felt a bump. She has not  noted skin changes. She has chronic back pain, but doesn't believe it is related to the groin pain.  Denies any recent injury.   Review of Systems  Constitutional:  Positive for activity change, appetite change, chills, fatigue and fever.  HENT:  Positive for postnasal drip. Negative for facial swelling and mouth sores.   Respiratory:  Positive for cough. Negative for hemoptysis and wheezing.   Cardiovascular:  Negative for chest pain, palpitations and leg swelling.  Gastrointestinal:  Positive for nausea. Negative for abdominal pain, heartburn and vomiting.  Genitourinary:  Negative for decreased urine volume, dysuria, hematuria, vaginal discharge and vaginal pain.  Neurological:  Negative for syncope and facial asymmetry.  Psychiatric/Behavioral:  Negative for confusion and hallucinations.   See other pertinent positives and negatives in HPI.  Current Outpatient Medications on File Prior to Visit  Medication Sig Dispense Refill   aspirin EC 81 MG tablet Take 1 tablet (81 mg total) by mouth daily. Swallow whole. 90 tablet 3   B COMPLEX-ZINC PO Take 1 tablet by mouth daily.     clobetasol ointment (TEMOVATE) 0.05 % Apply 5 mm from tube opening, applied from the distal skin crease of the index finger to the tip at night for 30 days. 30 g 1   diclofenac Sodium (VOLTAREN) 1 % GEL Apply 2 g topically 4 (four) times daily. 100 g 3   ezetimibe (ZETIA) 10 MG tablet TAKE 1 TABLET BY MOUTH EVERY DAY 90 tablet 3   loratadine (CLARITIN) 10 MG  tablet Take 10 mg by mouth daily as needed for allergies.     nitroGLYCERIN (NITROSTAT) 0.4 MG SL tablet Place 1 tablet (0.4 mg total) under the tongue every 5 (five) minutes x 3 doses as needed for chest pain. 10 tablet 0   pantoprazole (PROTONIX) 40 MG tablet Take 1 tablet (40 mg total) by mouth daily. 90 tablet 1   triamcinolone cream (KENALOG) 0.1 % Apply 1 Application topically daily as needed (on affected skin.). 30 g 1   No current facility-administered  medications on file prior to visit.    Past Medical History:  Diagnosis Date   CAD in native artery 06/06/2020   CAD native.  Calcium score 176, 85th percentile 04/2020   Cataracts, bilateral    right eye - just watching   Chronic combined systolic and diastolic heart failure (HCC) 06/17/2021   Diabetes mellitus without complication (HCC)    Diverticulitis    GERD (gastroesophageal reflux disease) 2006   Hearing loss    right ear - no hearing aid   Heart attack (HCC) 2008   Hypertension    Irregular heart beat 1973   OSA on CPAP 05/04/2022   Palpitations 11/30/2019   SVD (spontaneous vaginal delivery)    x 3   Allergies  Allergen Reactions   Lipitor [Atorvastatin] Other (See Comments)    Hair loss   Sulfonamide Derivatives     Rash/itching     Social History   Socioeconomic History   Marital status: Married    Spouse name: Not on file   Number of children: Not on file   Years of education: Not on file   Highest education level: Not on file  Occupational History   Not on file  Tobacco Use   Smoking status: Former    Current packs/day: 0.00    Average packs/day: 1.5 packs/day for 37.0 years (55.5 ttl pk-yrs)    Types: Cigarettes    Start date: 01/31/1974    Quit date: 02/01/2011    Years since quitting: 12.0   Smokeless tobacco: Never  Vaping Use   Vaping status: Never Used  Substance and Sexual Activity   Alcohol use: Not Currently    Comment: occasional glass of wine   Drug use: No    Comment: No IV drug   Sexual activity: Not Currently    Birth control/protection: Post-menopausal  Other Topics Concern   Not on file  Social History Narrative   Armed forces operational officer   Lives in [place]   3 Grown Children   Legally Separated to Second Husband         Social Drivers of Health   Financial Resource Strain: Low Risk  (11/19/2021)   Overall Financial Resource Strain (CARDIA)    Difficulty of Paying Living Expenses: Not hard at all  Food Insecurity:  Food Insecurity Present (11/20/2021)   Hunger Vital Sign    Worried About Running Out of Food in the Last Year: Sometimes true    Ran Out of Food in the Last Year: Sometimes true  Transportation Needs: No Transportation Needs (11/19/2021)   PRAPARE - Administrator, Civil Service (Medical): No    Lack of Transportation (Non-Medical): No  Physical Activity: Inactive (11/19/2021)   Exercise Vital Sign    Days of Exercise per Week: 0 days    Minutes of Exercise per Session: 0 min  Stress: No Stress Concern Present (11/19/2021)   [employer] of Occupational Health - Occupational Stress Questionnaire  Feeling of Stress : Not at all  Social Connections: Not on file    Vitals:   02/12/23 1520  BP: 110/70  Pulse: 88  Resp: 16  Temp: 99.8 F (37.7 C)  SpO2: 99%   Body mass index is 29.75 kg/m.  Physical Exam Vitals and nursing note reviewed.  Constitutional:      General: She is not in acute distress.    Appearance: She is well-developed. She is not ill-appearing.  HENT:     Head: Normocephalic and atraumatic.     Right Ear: Tympanic membrane, ear canal and external ear normal.     Left Ear: Ear canal and external ear normal. A middle ear effusion is present. Tympanic membrane is not erythematous.     Nose: Rhinorrhea present.     Right Sinus: No maxillary sinus tenderness or frontal sinus tenderness.     Left Sinus: No maxillary sinus tenderness or frontal sinus tenderness.     Mouth/Throat:     Mouth: Mucous membranes are moist.     Pharynx: Uvula midline. Posterior oropharyngeal erythema (mild) and postnasal drip present. No pharyngeal swelling.  Eyes:     Conjunctiva/sclera: Conjunctivae normal.  Cardiovascular:     Rate and Rhythm: Regular rhythm.     Pulses:          Dorsalis pedis pulses are 2+ on the right side.       Posterior tibial pulses are 2+ on the right side.     Heart sounds: No murmur heard.    Comments: Good capillary refill in right  foot. No calf tenderness with food dorsi-flexion. Pulmonary:     Effort: Pulmonary effort is normal. No respiratory distress.     Breath sounds: Normal breath sounds. No stridor.  Abdominal:     Palpations: Abdomen is soft. There is no mass.     Tenderness: There is no abdominal tenderness.  Musculoskeletal:     Cervical back: No muscular tenderness.     Right hip: Tenderness present. Decreased range of motion.     Right upper leg: No edema, deformity or tenderness.     Right lower leg: Tenderness present. No edema.     Left lower leg: No edema.       Legs:  Lymphadenopathy:     Head:     Right side of head: No submandibular adenopathy.     Left side of head: No submandibular adenopathy.     Cervical: No cervical adenopathy.     Lower Body: No right inguinal adenopathy.  Skin:    General: Skin is warm.     Findings: No erythema or rash.  Neurological:     Mental Status: She is alert and oriented to person, place, and time.  Psychiatric:        Mood and Affect: Mood and affect normal.   ASSESSMENT AND PLAN:  Ms. Brecker was seen today for cough, body aches, chills, and dizziness.   Pain of right thigh Problem has been going on for a month, no hx of trauma. I do not appreciate groin abnormalities or lymphadenopathies. Pain elicited with hip ROM. We discussed possible etiologies, ? Tendinitis. Monitor for new symptoms.  Subcutaneous mass ? Sebaceous cyst, lipoma right thigh among some. For now continue monitoring for changes. Area is tender and mild local heat. Recommend empiric treatment with Doxycycline x 7 days for possible infectious process. Some side effects discussed. If it is persistent further work up will be arranged, soft tissue US.  Other orders -     Doxycycline Hyclate; Take 1 tablet (100 mg total) by mouth 2 (two) times daily for 7 days.  Dispense: 14 tablet; Refill: 0  Sore throat Mild. Negative strep. Throat lozenges.  -     POC COVID-19 BinaxNow -      POCT Influenza A/B -     POCT rapid strep A  URI, acute Tested negative for covid and flu in the office today and rapid strep.  Symptoms suggests a viral etiology, symptomatic treatment recommended . Instructed to monitor for signs of complications,instructed about warning signs. Adequate hydration. Plain Mucinex recommended. Tylenol 500 mg 3-4 times per day as needed I do not think CXR is necessary today. I also explained that cough and nasal congestion can last a few days and sometimes weeks. F/U as needed.  Return if symptoms worsen or fail to improve, for keep next appointment.  I, Rolla Etienne Wierda, acting as a scribe for Margo Lama [place], MD., have documented all relevant documentation on the behalf of Jayen Bromwell [place], MD, as directed by  Callyn Severtson [place], MD while in the presence of Kenidy Crossland [place], MD.   I, Sevan Mcbroom [place], MD, have reviewed all documentation for this visit. The documentation on 02/12/23 for the exam, diagnosis, procedures, and orders are all accurate and complete.  Alexandre Lightsey G. [place], MD  [facility]. [place] office.

## 2023-02-12 NOTE — Patient Instructions (Signed)
A few things to remember from today's visit:  Sore throat - Plan: POC COVID-19, POC Influenza A/B, POC Rapid Strep A  Cough, unspecified type - Plan: POC COVID-19, POC Influenza A/B  URI, acute  Pain of right thigh  Subcutaneous mass  Respiratory symptoms seem caused by a viral illness, self limited. Rest,liquids,and Tylenol 500 mg 3-4 times per day as needed. Continue checking temp, you should not have fever by Monday.  Area on right thigh, ? Lipoma. Because tender and local heat, I am giving you antibiotics for possible cellulitis/abscess.  If not resolved in 2 weeks, ultrasound can be arranged. Monitor for skin changes.  If you need refills for medications you take chronically, please call your pharmacy. Do not use My Chart to request refills or for acute issues that need immediate attention. If you send a my chart message, it may take a few days to be addressed, specially if I am not in the office.  Please be sure medication list is accurate. If a new problem present, please set up appointment sooner than planned today.

## 2023-02-15 ENCOUNTER — Telehealth: Payer: Self-pay | Admitting: Family Medicine

## 2023-02-15 NOTE — Telephone Encounter (Signed)
Pt is calling and would like a work note to return back to work 02-16-2023 and would like to be able to wear a mask. Pt was seen 02-12-2023. Pt would a pick up work note today

## 2023-02-15 NOTE — Telephone Encounter (Signed)
Letter has been created and is up front for pick up. I left patient a voicemail letting her know.

## 2023-03-19 ENCOUNTER — Telehealth: Payer: Self-pay | Admitting: Family Medicine

## 2023-03-19 ENCOUNTER — Other Ambulatory Visit: Payer: Self-pay | Admitting: Family Medicine

## 2023-03-19 ENCOUNTER — Ambulatory Visit: Payer: Self-pay | Admitting: Family Medicine

## 2023-03-19 MED ORDER — FLUCONAZOLE 150 MG PO TABS: 150.0000 mg | ORAL_TABLET | Freq: Once | ORAL | 0 refills | Status: AC

## 2023-03-19 NOTE — Telephone Encounter (Signed)
Pt is aware Rx was sent in, she will make f/u appt if not any better.

## 2023-03-19 NOTE — Telephone Encounter (Signed)
Copied from CRM [id number]. Topic: Clinical - Red Word Triage >> Mar 19, 2023  1:18 PM Gurney Maxin H wrote: [facility] that prompted transfer to Nurse Triage: Patient states she has a yeast infection, itching and discharge. States she just finished a round of antibiotics and wants to know if she can have something called in to pharmacy on file. Patient having pain in gut on right side and a persistent headache.   Chief Complaint: abdominal pain, headache, suspected yeast infection Symptoms: 6/10 abdominal pain to right side in line with belly button, headache "in different spots" for which she saw "dentist," vaginal itching and discharge Frequency: continual, intermittent Pertinent Negatives: Patient denies redness/swelling to abdominal area, pain with urination, issues with BM Disposition: [] 911 / [] ED /[] Urgent Care (no appt availability in office) / [] Appointment(In office/virtual)/ []  [facility]/ [] Home Care/ [x] Refused Recommended Disposition /[]  Mobile Bus/ []  Follow-up with PCP Additional Notes: Pt is poor historian, pt distracted with work environment by end of call. Pt spoke with agent about yeast infection symptoms with vaginal itching and discharge, agent was going to request prescription be sent in for pt from PCP office then pt mentioned to agent that pt been experiencing pain in gut to right side and ongoing headache so transferred to triage nurse. Pt confirms to nurse that abdominal pain 6/10 "moderate" at "2 inches from right side," confirms "kinda in line" with belly button. Pt confirms no redness/swelling there, no pain with urination, no trouble with BM, no chest pain or pain elsewhere. Pt reporting she has "acid reflux" that she confirms is helped by her prescribed meds. Pt reporting abdominal pain has been "on and off for few months," pt reporting no pattern so far with pain, nothing makes worse or better. Pt reporting headache for "2-3 months." Pt unable to state  where pain is in head, stating "in different spots," mentioning that she saw "dentist" for this and dentist recommended an "implant." Pt reporting she has "8 teeth left in top, need to come out, very sensitive." Nurse attempted asking pt more triage questions, pt conversing with coworkers, unable to complete full triage with pt heading into work. Pt asking if "can make this quick" so she can clock in. Advised appt today, pt refusing appt today because "have work." Advised pt go to [facility] for abdominal pain when off of work. Advised contact dentist again about dental pain. Advised call here if worsening symptoms. Advised that HP message would be sent to PCP to request prescription for yeast infection - please advise. Pt verbalized understanding.  Reason for Disposition  [1] MILD-MODERATE pain AND [2] not relieved by antacid medicine  Answer Assessment - Initial Assessment Questions 1. LOCATION: "Where does it hurt?"      2 inches from right side, kinda in line with belly button 2. RADIATION: "Does the pain shoot anywhere else?" (e.g., chest, back)     denies 3. ONSET: "When did the pain begin?" (e.g., minutes, hours or days ago)      Few months ago 5. PATTERN "Does the pain come and go, or is it constant?"    - If it comes and goes: "How long does it last?" "Do you have pain now?"     (Note: Comes and goes means the pain is intermittent. It goes away completely between bouts.)    - If constant: "Is it getting better, staying the same, or getting worse?"      (Note: Constant means the pain never goes away completely;  most serious pain is constant and gets worse.)      On and off 6. SEVERITY: "How bad is the pain?"  (e.g., Scale 1-10; mild, moderate, or severe)    - MILD (1-3): Doesn't interfere with normal activities, abdomen soft and not tender to touch..     - MODERATE (4-7): Interferes with normal activities or awakens from sleep, abdomen tender to touch.     - SEVERE (8-10): Excruciating pain,  doubled over, unable to do any normal activities.       6/10 moderate 8. AGGRAVATING FACTORS: "Does anything seem to cause this pain?" (e.g., foods, stress, alcohol)     unknown 9. CARDIAC SYMPTOMS: "Do you have any of the following symptoms: chest pain, difficulty breathing, sweating, nausea?"     No chest pain 10. OTHER SYMPTOMS: "Do you have any other symptoms?" (e.g., back pain, diarrhea, fever, urination pain, vomiting)      denies  Protocols used: Abdominal Pain - Upper-A-AH

## 2023-03-19 NOTE — Telephone Encounter (Signed)
You did prescribe an antibiotic for her

## 2023-03-19 NOTE — Telephone Encounter (Signed)
Copied from CRM [id number]. Topic: Clinical - Red Word Triage >> Mar 19, 2023  1:18 PM Gurney Maxin H wrote: [facility] that prompted transfer to Nurse Triage: Patient states she has a yeast infection, itching and discharge. States she just finished a round of antibiotics and wants to know if she can have something called in to pharmacy on file. Patient having pain in gut on right side and a persistent headache.

## 2023-03-19 NOTE — Telephone Encounter (Signed)
She can try OTC Monistat for 10 days. If symptoms are persisting, she can go ahead and take Diflucan 150 mg x 1. Rx sent. Thanks, BJ

## 2023-03-22 NOTE — Telephone Encounter (Signed)
 This encounter was created in error - please disregard.

## 2023-04-10 ENCOUNTER — Other Ambulatory Visit: Payer: Self-pay | Admitting: Family Medicine

## 2023-04-10 DIAGNOSIS — L293 Anogenital pruritus, unspecified: Secondary | ICD-10-CM

## 2023-04-19 ENCOUNTER — Telehealth: Payer: Self-pay

## 2023-04-19 ENCOUNTER — Other Ambulatory Visit: Payer: Self-pay | Admitting: Family Medicine

## 2023-04-19 DIAGNOSIS — L293 Anogenital pruritus, unspecified: Secondary | ICD-10-CM

## 2023-04-19 NOTE — Telephone Encounter (Signed)
PCP wanted pt to follow up in office if symptoms didn't resolve after last dose of Diflucan. I called and spoke with patient, appt made for Wednesday at 10am.

## 2023-04-19 NOTE — Telephone Encounter (Signed)
Copied from CRM [id number]. Topic: Clinical - Medication Refill >> Apr 19, 2023  4:36 PM Alcus Dad wrote: Most Recent Primary Care Visit:  Provider: [place], BETTY G  Department: [facility]  Visit Type: OFFICE VISIT  Date: 02/12/2023  Medication: fluconazole (patient requested stronger doasge)  (Patient stated that symptoms aren't getting any better)  Has the patient contacted their pharmacy? Yes (Agent: If no, request that the patient contact the pharmacy for the refill. If patient does not wish to contact the pharmacy document the reason why and proceed with request.) (Agent: If yes, when and what did the pharmacy advise?)  Is this the correct pharmacy for this prescription? Yes If no, delete pharmacy and type the correct one.  This is the patient's preferred pharmacy:  [id number] - [place], [place] - [address] AT [facility] [address] [place] [address] Phone: [phone] Fax: [phone]   Has the prescription been filled recently? No  Is the patient out of the medication? Yes  Has the patient been seen for an appointment in the last year OR does the patient have an upcoming appointment? Yes  Can we respond through MyChart? Yes  Agent: Please be advised that Rx refills may take up to 3 business days. We ask that you follow-up with your pharmacy.

## 2023-04-19 NOTE — Telephone Encounter (Signed)
Copied from CRM [id number]. Topic: Clinical - Medication Refill >> Apr 19, 2023  4:29 PM Alcus Dad wrote: Most Recent Primary Care Visit:  Provider: [place], BETTY G  Department: [facility]  Visit Type: OFFICE VISIT  Date: 02/12/2023  Medication: clobetasol ointment (TEMOVATE) 0.05 % Patient is requesting for a stronger clobetasol ointment Patient is also requesting fluconazole for yeast infection  Has the patient contacted their pharmacy? Yes (Agent: If no, request that the patient contact the pharmacy for the refill. If patient does not wish to contact the pharmacy document the reason why and proceed with request.) (Agent: If yes, when and what did the pharmacy advise?)  Is this the correct pharmacy for this prescription? Yes If no, delete pharmacy and type the correct one.  This is the patient's preferred pharmacy:  [id number] - [place],  - [address] AT [facility] [address] [place] [address] Phone: [phone] Fax: [phone]   Has the prescription been filled recently? No  Is the patient out of the medication? Yes  Has the patient been seen for an appointment in the last year OR does the patient have an upcoming appointment? Yes  Can we respond through MyChart? Yes  Agent: Please be advised that Rx refills may take up to 3 business days. We ask that you follow-up with your pharmacy.

## 2023-04-21 ENCOUNTER — Ambulatory Visit: Payer: Medicare Other | Admitting: Family Medicine

## 2023-04-23 ENCOUNTER — Ambulatory Visit: Payer: Medicare Other | Admitting: Family Medicine

## 2023-04-28 ENCOUNTER — Ambulatory Visit (INDEPENDENT_AMBULATORY_CARE_PROVIDER_SITE_OTHER): Payer: Medicare Other | Admitting: Family Medicine

## 2023-04-28 ENCOUNTER — Encounter: Payer: Self-pay | Admitting: Family Medicine

## 2023-04-28 VITALS — BP 126/80 | HR 94 | Resp 16 | Ht 63.2 in | Wt 174.0 lb

## 2023-04-28 DIAGNOSIS — T466X5A Adverse effect of antihyperlipidemic and antiarteriosclerotic drugs, initial encounter: Secondary | ICD-10-CM | POA: Diagnosis not present

## 2023-04-28 DIAGNOSIS — K219 Gastro-esophageal reflux disease without esophagitis: Secondary | ICD-10-CM

## 2023-04-28 DIAGNOSIS — I10 Essential (primary) hypertension: Secondary | ICD-10-CM | POA: Diagnosis not present

## 2023-04-28 DIAGNOSIS — N904 Leukoplakia of vulva: Secondary | ICD-10-CM | POA: Diagnosis not present

## 2023-04-28 DIAGNOSIS — I5042 Chronic combined systolic (congestive) and diastolic (congestive) heart failure: Secondary | ICD-10-CM

## 2023-04-28 DIAGNOSIS — I2510 Atherosclerotic heart disease of native coronary artery without angina pectoris: Secondary | ICD-10-CM

## 2023-04-28 DIAGNOSIS — G720 Drug-induced myopathy: Secondary | ICD-10-CM | POA: Diagnosis not present

## 2023-04-28 DIAGNOSIS — E1169 Type 2 diabetes mellitus with other specified complication: Secondary | ICD-10-CM

## 2023-04-28 DIAGNOSIS — R002 Palpitations: Secondary | ICD-10-CM | POA: Diagnosis not present

## 2023-04-28 LAB — POCT GLYCOSYLATED HEMOGLOBIN (HGB A1C): HbA1c, POC (prediabetic range): 6.2 % (ref 5.7–6.4)

## 2023-04-28 MED ORDER — NITROGLYCERIN 0.4 MG SL SUBL: 0.4000 mg | SUBLINGUAL_TABLET | SUBLINGUAL | 0 refills | Status: AC | PRN

## 2023-04-28 NOTE — Assessment & Plan Note (Signed)
 We discussed benefits of statin medication.She has not tolerated this type of medication in the past. Currently on Zetia 10 mg daily.

## 2023-04-28 NOTE — Assessment & Plan Note (Signed)
 HgA1C at goal, stable at 6.2. Continue non pharmacologic treatment. Annual eye exam, periodic dental and foot care recommended. F/U in 6 months.

## 2023-04-28 NOTE — Assessment & Plan Note (Signed)
 BP adequately controlled. Continue nonpharmacologic treatment. Monitor BP regularly. Eye exam is current.

## 2023-04-28 NOTE — Patient Instructions (Addendum)
 A few things to remember from today's visit:  Lichen sclerosus et atrophicus of the vulva - Plan: Ambulatory referral to Gynecology  Type 2 diabetes mellitus with other specified complication, without long-term current use of insulin (HCC) - Plan: Basic metabolic panel, Microalbumin / creatinine urine ratio  Primary hypertension  Chronic combined systolic and diastolic heart failure (HCC), Chronic Please call your cardiologist, if any episode of palpitation or chest pain you need to seek immediate medical attention.  No changes today.  If you need refills for medications you take chronically, please call your pharmacy. Do not use My Chart to request refills or for acute issues that need immediate attention. If you send a my chart message, it may take a few days to be addressed, specially if I am not in the office.  Please be sure medication list is accurate. If a new problem present, please set up appointment sooner than planned today.

## 2023-04-28 NOTE — Assessment & Plan Note (Addendum)
 Reports having episode of mid chest discomfort, which she describes as "indigestion" Instruct to take pantoprazole 40 mg daily. GERD precautions also recommended.

## 2023-04-28 NOTE — Progress Notes (Signed)
 Chief Complaint  Patient presents with   Vaginitis   Follow-up   HPI: Ms.Wendy Mosley is a 73 y.o. female with a PMHx significant for HTN, CAD, SVT, OSA on CPAP, GERD, DM II, HLD, statin myopathy, and OA here today complaining for follow up.  Today she is c/o genital pruritus. We have discussed this problem on 07/24/22 and 02/12/23. Clobetasol cream was recommended in 01/2023 due to hx of lichen sclerosus. She has not noted genital lesions, vaginal bleeding or discharge. She has been treated with Fluconazole as well.  Diabetes Mellitus II: Dx'ed in 2022  - Checking BG at home: Not checking. She is on nonpharmacologic treatment. Jardiance caused yeast infections. - eye exam: 08/2022. - foot exam: Over a year ago. - Negative for symptoms of hypoglycemia, polyuria, polydipsia, numbness extremities, foot ulcers/trauma  HgA1C 6.2 in 07/2022.  Lab Results  Component Value Date   MICROALBUR <0.7 02/18/2022   Hypertension:  She is on nonpharmacologic treatment. Monitoring BP at home, reporting readings similar to today's. Negative for unusual or severe headache, visual changes, exertional chest pain, dyspnea,  focal weakness, or edema. Recent episode of palpitation at rest, no associated symptoms. Hx of SVT.  Lab Results  Component Value Date   CREATININE 0.81 03/27/2022   BUN 13 03/27/2022   NA 142 03/27/2022   K 3.8 03/27/2022   CL 105 03/27/2022   CO2 28 03/27/2022   She reports episode of chest discomfort after eating dinner and while resting.  She has episodes of "indigestion" and requesting refills on nitroglycerin. CAD: Sh eis not on statin, it caused myalgias. HLD on Zetia 10 mg daily.  Lab Results  Component Value Date   CHOL 190 11/24/2021   HDL 41 11/24/2021   LDLCALC 124 (H) 11/24/2021   LDLDIRECT 138 (H) 07/02/2011   TRIG 137 11/24/2021   CHOLHDL 4.6 (H) 11/24/2021   Last visit with cardiologist in 11/2022. Coronary calcium score: The patient's  coronary artery calcium score is 238, which places the patient in the 87 percentile. Currently she is on Aspirin 81 mg daily. HFrEF: Negative for orthopnea and PND. Echo 12/2021 LVEF 55-60% and grade I diastolic dysfunction. 05/2021 LVEF 45-50%.  GERD on Pantoprazole 40 mg, which she is not taking daily.  Review of Systems  Constitutional:  Negative for activity change, appetite change, chills and fever.  HENT:  Negative for sore throat and trouble swallowing.   Respiratory:  Negative for cough and wheezing.   Gastrointestinal:  Negative for abdominal pain, nausea and vomiting.  Genitourinary:  Negative for decreased urine volume, dysuria and hematuria.  Skin:  Negative for rash.  Neurological:  Negative for syncope and facial asymmetry.  Psychiatric/Behavioral:  Negative for confusion and hallucinations.   See other pertinent positives and negatives in HPI.  Current Outpatient Medications on File Prior to Visit  Medication Sig Dispense Refill   aspirin EC 81 MG tablet Take 1 tablet (81 mg total) by mouth daily. Swallow whole. 90 tablet 3   B COMPLEX-ZINC PO Take 1 tablet by mouth daily.     clobetasol ointment (TEMOVATE) 0.05 % Apply 5 mm from tube opening, applied from the distal skin crease of the index finger to the tip at night for 30 days. 30 g 1   diclofenac Sodium (VOLTAREN) 1 % GEL Apply 2 g topically 4 (four) times daily. 100 g 3   ezetimibe (ZETIA) 10 MG tablet TAKE 1 TABLET BY MOUTH EVERY DAY 90 tablet 3  loratadine (CLARITIN) 10 MG tablet Take 10 mg by mouth daily as needed for allergies.     pantoprazole (PROTONIX) 40 MG tablet Take 1 tablet (40 mg total) by mouth daily. 90 tablet 1   triamcinolone cream (KENALOG) 0.1 % Apply 1 Application topically daily as needed (on affected skin.). 30 g 1   No current facility-administered medications on file prior to visit.    Past Medical History:  Diagnosis Date   CAD in native artery 06/06/2020   CAD native.  Calcium score  176, 85th percentile 04/2020   Cataracts, bilateral    right eye - just watching   Chronic combined systolic and diastolic heart failure (HCC) 06/17/2021   Diabetes mellitus without complication (HCC)    Diverticulitis    GERD (gastroesophageal reflux disease) 2006   Hearing loss    right ear - no hearing aid   Heart attack (HCC) 2008   Hypertension    Irregular heart beat 1973   OSA on CPAP 05/04/2022   Palpitations 11/30/2019   SVD (spontaneous vaginal delivery)    x 3   Allergies  Allergen Reactions   Lipitor [Atorvastatin] Other (See Comments)    Hair loss   Sulfonamide Derivatives     Rash/itching     Social History   Socioeconomic History   Marital status: Married    Spouse name: Not on file   Number of children: Not on file   Years of education: Not on file   Highest education level: Not on file  Occupational History   Not on file  Tobacco Use   Smoking status: Former    Current packs/day: 0.00    Average packs/day: 1.5 packs/day for 37.0 years (55.5 ttl pk-yrs)    Types: Cigarettes    Start date: 01/31/1974    Quit date: 02/01/2011    Years since quitting: 12.2   Smokeless tobacco: Never  Vaping Use   Vaping status: Never Used  Substance and Sexual Activity   Alcohol use: Not Currently    Comment: occasional glass of wine   Drug use: No    Comment: No IV drug   Sexual activity: Not Currently    Birth control/protection: Post-menopausal  Other Topics Concern   Not on file  Social History Narrative   Armed forces operational officer   Lives in [place]   3 Grown Children   Legally Separated to Second Husband         Social Drivers of Health   Financial Resource Strain: Low Risk  (11/19/2021)   Overall Financial Resource Strain (CARDIA)    Difficulty of Paying Living Expenses: Not hard at all  Food Insecurity: Food Insecurity Present (11/20/2021)   Hunger Vital Sign    Worried About Running Out of Food in the Last Year: Sometimes true    Ran Out of  Food in the Last Year: Sometimes true  Transportation Needs: No Transportation Needs (11/19/2021)   PRAPARE - Administrator, Civil Service (Medical): No    Lack of Transportation (Non-Medical): No  Physical Activity: Inactive (11/19/2021)   Exercise Vital Sign    Days of Exercise per Week: 0 days    Minutes of Exercise per Session: 0 min  Stress: No Stress Concern Present (11/19/2021)   [employer] of Occupational Health - Occupational Stress Questionnaire    Feeling of Stress : Not at all  Social Connections: Not on file    Vitals:   04/28/23 1140  BP: 126/80  Pulse: 94  Resp: 16  SpO2: 96%   Body mass index is 30.63 kg/m.  Physical Exam Vitals and nursing note reviewed.  Constitutional:      General: She is not in acute distress.    Appearance: She is well-developed.  HENT:     Head: Normocephalic and atraumatic.     Mouth/Throat:     Mouth: Mucous membranes are moist.     Pharynx: Oropharynx is clear.  Eyes:     Conjunctiva/sclera: Conjunctivae normal.  Cardiovascular:     Rate and Rhythm: Normal rate and regular rhythm.     Pulses:          Dorsalis pedis pulses are 2+ on the right side and 2+ on the left side.     Heart sounds: No murmur heard. Pulmonary:     Effort: Pulmonary effort is normal. No respiratory distress.     Breath sounds: Normal breath sounds.  Abdominal:     Palpations: Abdomen is soft. There is no mass.     Tenderness: There is no abdominal tenderness.  Genitourinary:    Comments: Deferred to gyn. Lymphadenopathy:     Cervical: No cervical adenopathy.  Skin:    General: Skin is warm.     Findings: No erythema or rash.  Neurological:     General: No focal deficit present.     Mental Status: She is alert and oriented to person, place, and time.     Cranial Nerves: No cranial nerve deficit.     Gait: Gait normal.  Psychiatric:        Mood and Affect: Mood and affect normal.   ASSESSMENT AND PLAN:  Ms. Uriegas was seen  today for yeast infection symptoms.   Lab Results  Component Value Date   HGBA1C 6.2 04/28/2023   Type 2 diabetes mellitus with other specified complication, without long-term current use of insulin (HCC) Assessment & Plan: HgA1C at goal, stable at 6.2. Continue non pharmacologic treatment. Annual eye exam, periodic dental and foot care recommended. F/U in 6 months.  Orders: -     Basic metabolic panel; Future -     Microalbumin / creatinine urine ratio; Future -     POCT glycosylated hemoglobin (Hb A1C)  Lichen sclerosus et atrophicus of the vulva Assessment & Plan: She continues c/o genital pruritus. Clobetasol cream did not help. Has taken Fluconazole and topical antimycotic medication with no relief. Pelvic exam 07/2022. Recommend gyn consultation.  Orders: -     Ambulatory referral to Gynecology  Primary hypertension Assessment & Plan: BP adequately controlled. Continue monitoring BP regularly. Continue non pharmacologic treatment.   Chronic combined systolic and diastolic heart failure [facility]) Assessment & Plan: Echo 12/2021 LVEF 55-60% and grade I diastolic dysfunction. 05/2021 LVEF 45-50%. She did not tolerate Jardiance, caused "yeast" infections. She is not on BB,RAS med,or diuretics. Continue low salt diet.   Statin myopathy Assessment & Plan: We discussed benefits of statin medication.She has not tolerated this type of medication in the past. Currently on Zetia 10 mg daily.   Gastroesophageal reflux disease without esophagitis Assessment & Plan: Reports having episode of mid chest discomfort, which she describes as "indigestion" Instruct to take pantoprazole 40 mg daily. GERD precautions also recommended.   Palpitations Assessment & Plan: Today she is reporting an episode of palpitations that happened when she was resting on a chair, lasted a few minutes and with some associated lightheadedness. She was clearly instructed about warning signs. Last  visit with cardiologist in 11/2022, instructed to  arrange appointment for follow-up.   CAD in native artery Assessment & Plan: Instructed about warning signs. Prescription for nitroglycerine 0.4 mg tabs sent to her pharmacy as requested. Following with cardiologist.  Orders: -     Nitroglycerin; Place 1 tablet (0.4 mg total) under the tongue every 5 (five) minutes x 3 doses as needed for chest pain.  Dispense: 10 tablet; Refill: 0   I spent a total of 42 minutes in both face to face and non face to face activities for this visit on the date of this encounter. During this time history was obtained and documented, examination was performed, prior labs/imaging reviewed, and assessment/plan discussed.  Return in about 6 months (around 10/26/2023).  I, Rolla Etienne Wierda, acting as a scribe for Jami Bogdanski [place], MD., have documented all relevant documentation on the behalf of Duwayne Matters [place], MD, as directed by  Leovanni Bjorkman [place], MD while in the presence of Daril Warga [place], MD.   I, Briston Lax [place], MD, have reviewed all documentation for this visit. The documentation on 04/28/23 for the exam, diagnosis, procedures, and orders are all accurate and complete.  Jayson Waterhouse G. [place], MD  [facility] office.

## 2023-04-28 NOTE — Assessment & Plan Note (Signed)
 Today she is reporting an episode of palpitations that happened when she was resting on a chair, lasted a few minutes and with some associated lightheadedness. She was clearly instructed about warning signs. Last visit with cardiologist in 11/2022, instructed to arrange appointment for follow-up.

## 2023-05-01 ENCOUNTER — Encounter: Payer: Self-pay | Admitting: Family Medicine

## 2023-05-01 NOTE — Assessment & Plan Note (Signed)
 Echo 12/2021 LVEF 55-60% and grade I diastolic dysfunction. 05/2021 LVEF 45-50%. She did not tolerate Jardiance, caused "yeast" infections. She is not on BB,RAS med,or diuretics. Continue low salt diet.

## 2023-05-01 NOTE — Assessment & Plan Note (Addendum)
 Instructed about warning signs. Prescription for nitroglycerine 0.4 mg tabs sent to her pharmacy as requested. Following with cardiologist.

## 2023-05-01 NOTE — Assessment & Plan Note (Signed)
BP adequately controlled. Continue monitoring BP regularly. Continue non pharmacologic treatment.

## 2023-05-01 NOTE — Assessment & Plan Note (Addendum)
 She continues c/o genital pruritus. Clobetasol cream did not help. Has taken Fluconazole and topical antimycotic medication with no relief. Pelvic exam 07/2022. Recommend gyn consultation.

## 2023-05-03 ENCOUNTER — Other Ambulatory Visit (HOSPITAL_BASED_OUTPATIENT_CLINIC_OR_DEPARTMENT_OTHER): Payer: Self-pay | Admitting: Cardiovascular Disease

## 2023-05-03 DIAGNOSIS — R1013 Epigastric pain: Secondary | ICD-10-CM

## 2023-05-24 ENCOUNTER — Telehealth: Payer: Self-pay | Admitting: Acute Care

## 2023-05-24 NOTE — Telephone Encounter (Signed)
 Returned call from VM but phone disconnected after 2 rings

## 2023-05-24 NOTE — Telephone Encounter (Signed)
 Returned call. LVM and request call back to discuss concern in chart.

## 2023-05-24 NOTE — Telephone Encounter (Signed)
 Patient states that she looked into MyChart and the amount of cigarette that she use to smoke is incorrect. She would like it changed so that it doesn't effect her insurance . 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

## 2023-06-03 ENCOUNTER — Telehealth: Payer: Self-pay | Admitting: *Deleted

## 2023-06-03 NOTE — Telephone Encounter (Signed)
 Encounter closed

## 2023-06-10 ENCOUNTER — Encounter: Payer: Self-pay | Admitting: Obstetrics and Gynecology

## 2023-06-10 ENCOUNTER — Ambulatory Visit: Payer: Self-pay | Admitting: Obstetrics and Gynecology

## 2023-06-10 VITALS — BP 138/78 | HR 74 | Ht 63.0 in | Wt 169.0 lb

## 2023-06-10 DIAGNOSIS — L292 Pruritus vulvae: Secondary | ICD-10-CM

## 2023-06-10 DIAGNOSIS — R9389 Abnormal findings on diagnostic imaging of other specified body structures: Secondary | ICD-10-CM | POA: Insufficient documentation

## 2023-06-10 LAB — WET PREP FOR TRICH, YEAST, CLUE

## 2023-06-10 MED ORDER — CLOBETASOL PROPIONATE 0.05 % EX OINT: 1.0000 | TOPICAL_OINTMENT | Freq: Two times a day (BID) | CUTANEOUS | 0 refills | Status: AC

## 2023-06-10 NOTE — Progress Notes (Signed)
 73 y.o. [id number] postmenopausal female with fibroids here for vulvar itching. Married.  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  Persistent vulvar pruritus - vaginal irritation after taking antibiotics last month. started Sout as yeast infection but then returned with irritations. Used monistat and vagisil without relief. Also used alcohol to calm the irritation down but still irritated.   Of note she had a TVUS performed June 2024 due to pelvic pain which revealed a 2 cm fibroid, 7 mm endometrial thickness, 7 mm right adnexal calcification.  She was referred by her PCP for GYN care, however she has not been seen by gynecologist.  She denies any pelvic pain or postmenopausal bleeding today.  Reports a known history of fibroids.  GYN HISTORY: Fibroids  OB History  Gravida Para Term Preterm AB Living  5 3 3  2 3   SAB IAB Ectopic Multiple Live Births   2   3    # Outcome Date GA Lbr Len/2nd Weight Sex Type Anes PTL Lv  5 IAB           4 IAB           3 Term      Vag-Spont   LIV  2 Term      Vag-Spont   LIV  1 Term      Vag-Spont   LIV    Obstetric Comments  Last Period - 73 yo  No post menopausal bleeding  No abnormal PAP smears reported - Unsure.    Past Medical History:  Diagnosis Date   CAD in native artery 06/06/2020   CAD native.  Calcium score 176, 85th percentile 04/2020   Cataracts, bilateral    right eye - just watching   Chronic combined systolic and diastolic heart failure (HCC) 06/17/2021   Diabetes mellitus without complication (HCC)    Diverticulitis    GERD (gastroesophageal reflux disease) 2006   Hearing loss    right ear - no hearing aid   Heart attack (HCC) 2008   Hypertension    Irregular heart beat 1973   OSA on CPAP 05/04/2022   Palpitations 11/30/2019   SVD (spontaneous vaginal delivery)    x 3    Past Surgical History:  Procedure Laterality Date   APPENDECTOMY  1967   BREAST SURGERY     cyst removed from right breast at age 25   CHOLECYSTECTOMY  1975    COLONOSCOPY     DILATATION & CURETTAGE/HYSTEROSCOPY WITH MYOSURE N/A 02/21/2018   Procedure: DILATATION & CURETTAGE/HYSTEROSCOPY WITH MYOSURE RESECTION OF ENDOMETRIAL POLYP;  Surgeon: Maxie Better, MD;  Location: [facility];  Service: Gynecology;  Laterality: N/A;   EXTERNAL EAR SURGERY  1974   TUBAL LIGATION  1977    Current Outpatient Medications on File Prior to Visit  Medication Sig Dispense Refill   aspirin EC 81 MG tablet Take 1 tablet (81 mg total) by mouth daily. Swallow whole. 90 tablet 3   B COMPLEX-ZINC PO Take 1 tablet by mouth daily.     clobetasol ointment (TEMOVATE) 0.05 % Apply 5 mm from tube opening, applied from the distal skin crease of the index finger to the tip at night for 30 days. 30 g 1   diclofenac Sodium (VOLTAREN) 1 % GEL Apply 2 g topically 4 (four) times daily. 100 g 3   ezetimibe (ZETIA) 10 MG tablet TAKE 1 TABLET BY MOUTH EVERY DAY 90 tablet 3   loratadine (CLARITIN) 10 MG tablet Take 10 mg by mouth daily as needed for allergies.  nitroGLYCERIN (NITROSTAT) 0.4 MG SL tablet Place 1 tablet (0.4 mg total) under the tongue every 5 (five) minutes x 3 doses as needed for chest pain. 10 tablet 0   pantoprazole (PROTONIX) 40 MG tablet TAKE 1 TABLET BY MOUTH EVERY DAY 90 tablet 3   triamcinolone cream (KENALOG) 0.1 % Apply 1 Application topically daily as needed (on affected skin.). 30 g 1   No current facility-administered medications on file prior to visit.    Allergies  Allergen Reactions   Lipitor [Atorvastatin] Other (See Comments)    Hair loss   Sulfonamide Derivatives     Rash/itching       PE Today's Vitals   06/10/23 1112  BP: 138/78  Pulse: 74  TempSrc: Oral  SpO2: 98%  Weight: 169 lb (76.7 kg)  Height: 5\' 3"  (1.6 m)   Body mass index is 29.94 kg/m.  Physical Exam Vitals reviewed. Exam conducted with a chaperone present.  Constitutional:      General: She is not in acute distress.    Appearance: Normal appearance.  HENT:      Head: Normocephalic and atraumatic.     Nose: Nose normal.  Eyes:     Extraocular Movements: Extraocular movements intact.     Conjunctiva/sclera: Conjunctivae normal.  Pulmonary:     Effort: Pulmonary effort is normal.  Genitourinary:    General: Normal vulva.     Exam position: Lithotomy position.     Vagina: Normal. No vaginal discharge.     Cervix: Normal. No cervical motion tenderness, discharge or lesion.     Uterus: Normal. Not enlarged and not tender.      Adnexa: Right adnexa normal and left adnexa normal.     Comments: Dry vulva with areas of hypopigmentation along inner vulva Musculoskeletal:        General: Normal range of motion.     Cervical back: Normal range of motion.  Neurological:     General: No focal deficit present.     Mental Status: She is alert.  Psychiatric:        Mood and Affect: Mood normal.        Behavior: Behavior normal.      Assessment and Plan:        Vulvar itching -     WET PREP FOR TRICH, YEAST, CLUE -     Clobetasol Propionate; Apply 1 Application topically 2 (two) times daily for 14 days.  Dispense: 60 g; Refill: 0  Abnormal ultrasound of pelvis -     US PELVIS TRANSVAGINAL NON-OB (TV ONLY); Future  Reviewed 2024 TVUS and discussed with patient today. Return office for interval TVUS to reassess uterine fibroids, endometrial lining, and right adnexal calcification.  Rosalyn Gess, MD

## 2023-06-25 ENCOUNTER — Ambulatory Visit
Admission: RE | Admit: 2023-06-25 | Discharge: 2023-06-25 | Disposition: A | Discharge status: Home / Self Care | Source: Ambulatory Visit | Attending: Family Medicine | Admitting: Family Medicine

## 2023-06-25 DIAGNOSIS — Z87891 Personal history of nicotine dependence: Secondary | ICD-10-CM

## 2023-06-25 DIAGNOSIS — Z122 Encounter for screening for malignant neoplasm of respiratory organs: Secondary | ICD-10-CM

## 2023-06-30 ENCOUNTER — Ambulatory Visit: Payer: Self-pay

## 2023-06-30 NOTE — Telephone Encounter (Signed)
  Chief Complaint: mild dizziness Symptoms: mild dizziness, hypertension Frequency: x 2 days Pertinent Negatives: Patient denies unilateral numbness or weakness, headaches, changes in speech or vision, facial droop, chest pain, SOB Disposition: [] ED /[] Urgent Care (no appt availability in office) / [x] Appointment(In office/virtual)/ []  [facility]/ [] Home Care/ [] Refused Recommended Disposition /[] [facility]/ []  Follow-up with PCP Additional Notes: Patient calling in for triage states her BP is 150/79 and she has had mild dizziness x 2 days. Patient agreeable to acute visit and states she can not see PCP at available appt on Friday due to she will be working. Patient agreeable to see Dr Arliss Lam tomorrow morning. Advised patient to call back for new or worsening symptoms, advised rest and hydrate.  Copied from CRM [phone] [phone]. Topic: Clinical - Red Word Triage >> Jun 30, 2023 11:03 AM Wendy Mosley wrote: [facility] that prompted transfer to Nurse Triage: Pt stated has had some dizziness, blood pressure 150/79 - connected to nurse triage line Reason for Disposition  [1] MILD dizziness (e.g., walking normally) AND [2] has NOT been evaluated by doctor (or NP/PA) for this  (Exception: Dizziness caused by heat exposure, sudden standing, or poor fluid intake.)  Answer Assessment - Initial Assessment Questions 1. DESCRIPTION: "Describe your dizziness."     Lightheaded.  2. LIGHTHEADED: "Do you feel lightheaded?" (e.g., somewhat faint, woozy, weak upon standing)     Yes, today.  3. VERTIGO: "Do you feel like either you or the room is spinning or tilting?" (i.e. 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  4. SEVERITY: "How bad is it?"  "Do you feel like you are going to faint?" "Can you stand and walk?"   - MILD: Feels slightly dizzy, but walking normally.   - MODERATE: Feels unsteady when walking, but not falling; interferes with normal activities (e.g., school, work).    - SEVERE: Unable to walk without falling, or requires assistance to walk without falling; feels like passing out now.      Mild.  5. ONSET:  "When did the dizziness begin?"     X 2 days.  6. AGGRAVATING FACTORS: "Does anything make it worse?" (e.g., standing, change in head position)     Denies.  7. HEART RATE: "Can you tell me your heart rate?" "How many beats in 15 seconds?"  (Note: not all patients can do this)       HR 77 and BP 150/79.  8. CAUSE: "What do you think is causing the dizziness?"     Elevated blood pressure.  9. RECURRENT SYMPTOM: "Have you had dizziness before?" If Yes, ask: "When was the last time?" "What happened that time?"     Denies.  10. OTHER SYMPTOMS: "Do you have any other symptoms?" (e.g., fever, chest pain, vomiting, diarrhea, bleeding)       Denies.  11. PREGNANCY: "Is there any chance you are pregnant?" "When was your last menstrual period?"       N/A.  Protocols used: Dizziness - Lightheadedness-A-AH

## 2023-07-01 ENCOUNTER — Ambulatory Visit: Admitting: Family Medicine

## 2023-07-05 ENCOUNTER — Encounter: Payer: Self-pay | Admitting: Family Medicine

## 2023-07-05 ENCOUNTER — Ambulatory Visit (INDEPENDENT_AMBULATORY_CARE_PROVIDER_SITE_OTHER): Admitting: Family Medicine

## 2023-07-05 VITALS — BP 134/80 | HR 76 | Temp 98.7°F | Resp 16 | Ht 63.0 in | Wt 174.0 lb

## 2023-07-05 DIAGNOSIS — E785 Hyperlipidemia, unspecified: Secondary | ICD-10-CM

## 2023-07-05 DIAGNOSIS — E1169 Type 2 diabetes mellitus with other specified complication: Secondary | ICD-10-CM

## 2023-07-05 DIAGNOSIS — R42 Dizziness and giddiness: Secondary | ICD-10-CM | POA: Diagnosis not present

## 2023-07-05 DIAGNOSIS — I10 Essential (primary) hypertension: Secondary | ICD-10-CM

## 2023-07-05 LAB — COMPREHENSIVE METABOLIC PANEL WITH GFR
ALT: 15 U/L (ref 0–35)
AST: 15 U/L (ref 0–37)
Albumin: 4 g/dL (ref 3.5–5.2)
Alkaline Phosphatase: 124 U/L — ABNORMAL HIGH (ref 39–117)
BUN: 12 mg/dL (ref 6–23)
CO2: 31 meq/L (ref 19–32)
Calcium: 9.2 mg/dL (ref 8.4–10.5)
Chloride: 102 meq/L (ref 96–112)
Creatinine, Ser: 0.95 mg/dL (ref 0.40–1.20)
GFR: 59.66 mL/min — ABNORMAL LOW (ref >60.00)
Glucose, Bld: 91 mg/dL (ref 70–99)
Potassium: 4 meq/L (ref 3.5–5.1)
Sodium: 140 meq/L (ref 135–145)
Total Bilirubin: 0.6 mg/dL (ref 0.2–1.2)
Total Protein: 7.4 g/dL (ref 6.0–8.3)

## 2023-07-05 LAB — CBC
HCT: 38.3 % (ref 36.0–46.0)
Hemoglobin: 11.9 g/dL — ABNORMAL LOW (ref 12.0–15.0)
MCHC: 31.2 g/dL (ref 30.0–36.0)
MCV: 72.6 fl — ABNORMAL LOW (ref 78.0–100.0)
Platelets: 377 10*3/uL (ref 150.0–400.0)
RBC: 5.28 Mil/uL — ABNORMAL HIGH (ref 3.87–5.11)
RDW: 15.9 % — ABNORMAL HIGH (ref 11.5–15.5)
WBC: 5.5 10*3/uL (ref 4.0–10.5)

## 2023-07-05 LAB — MICROALBUMIN / CREATININE URINE RATIO
Creatinine,U: 164 mg/dL
Microalb Creat Ratio: 9.5 mg/g (ref 0.0–30.0)
Microalb, Ur: 1.6 mg/dL (ref 0.0–1.9)

## 2023-07-05 LAB — TSH: TSH: 1.84 u[IU]/mL (ref 0.35–5.50)

## 2023-07-05 NOTE — Progress Notes (Signed)
 ACUTE VISIT Chief Complaint  Patient presents with   Dizziness   HPI: Wendy Mosley is a 73 y.o. female with a PMHx significant for HTN, CAD, SVT, OSA on CPAP, GERD, DM II, HLD, statin myopathy, and OA who is here today complaining of dizziness.   Patient complains of intermittent episodes of dizziness for years.  The episodes last 15-20 minutes, and make her feel like she may fall. They have not changed in quality over the years.   She has not noticed any pattern for the time of day she has episodes. She does not have them in bed.  Denies risk for dehydration or skipping meals prior to episodes.   Dizziness This is a chronic problem. The current episode started more than 1 year ago. Pertinent negatives include no abdominal pain, anorexia, congestion, coughing, diaphoresis, fever, nausea, neck pain, numbness, rash, sore throat, swollen glands, urinary symptoms, vertigo, visual change, vomiting or weakness. Nothing aggravates the symptoms. She has tried nothing for the symptoms.   She checked her BP during one episode and it was 154/79. Has not checked her blood sugar during an episode.  Not taking anything OTC for the dizziness.  She follows regularly with her eye doctor. Her next appointment is in 08/2023.  Hx of hemorrhagic stroke seen on brain MRI done on 06/15/21 for dizziness and focal weakness.  Pertinent negatives include headaches, chest pain, palpitations, syncope, hearing changes, tinnitus, or falls.   Brain MRI from 06/15/2021 Impression:  1.) No acute intracranial abnormality.  2.) Remote hemorrhagic infarct of the left caudate head and internal capsule.  3.) Periventricular and subcortical T2 hyperintensities bilaterally are moderately advanced for age. This likely reflects the sequela of chronic microvascular ischemia.   CAD and aortic atherosclerosis on Aspirin  81 mg daily. Hyperlipidemia:  Currently on Zetia  10 mg daily. She has taken statins in the past but had a  rash and myopathy.  Lab Results  Component Value Date   CHOL 190 11/24/2021   HDL 41 11/24/2021   LDLCALC 124 (H) 11/24/2021   LDLDIRECT 138 (H) 07/02/2011   TRIG 137 11/24/2021   CHOLHDL 4.6 (H) 11/24/2021   Hypertension: Not currently on pharmacologic treatment. She says she has had rashes with BP medications in the past.  She doesn't check her BP regularly at home.  Negative for visual changes, exertional chest pain, dyspnea,  focal weakness, or edema.  Lab Results  Component Value Date   CREATININE 0.81 03/27/2022   BUN 13 03/27/2022   NA 142 03/27/2022   K 3.8 03/27/2022   CL 105 03/27/2022   CO2 28 03/27/2022   Also mentions she is considering having her teeth pulled because of frequent headaches, frontal for years and not related to above problems.  Looking for a new dentist.   She is also inquiring about results of recent chest CT for lung cancer screening.  Review of Systems  Constitutional:  Negative for diaphoresis and fever.  HENT:  Negative for congestion and sore throat.   Respiratory:  Negative for cough and wheezing.   Gastrointestinal:  Negative for abdominal pain, anorexia, nausea and vomiting.  Endocrine: Negative for cold intolerance and heat intolerance.  Genitourinary:  Negative for decreased urine volume, dysuria and hematuria.  Musculoskeletal:  Negative for neck pain.  Skin:  Negative for rash.  Neurological:  Positive for dizziness. Negative for vertigo, syncope, weakness and numbness.  Psychiatric/Behavioral:  Negative for confusion and hallucinations.   See other pertinent positives and negatives in  HPI.  Current Outpatient Medications on File Prior to Visit  Medication Sig Dispense Refill   aspirin  EC 81 MG tablet Take 1 tablet (81 mg total) by mouth daily. Swallow whole. 90 tablet 3   B COMPLEX-ZINC PO Take 1 tablet by mouth daily.     clobetasol  ointment (TEMOVATE ) 0.05 % Apply 5 mm from tube opening, applied from the distal skin crease of  the index finger to the tip at night for 30 days. 30 g 1   diclofenac  Sodium (VOLTAREN ) 1 % GEL Apply 2 g topically 4 (four) times daily. 100 g 3   ezetimibe  (ZETIA ) 10 MG tablet TAKE 1 TABLET BY MOUTH EVERY DAY 90 tablet 3   loratadine  (CLARITIN ) 10 MG tablet Take 10 mg by mouth daily as needed for allergies.     nitroGLYCERIN  (NITROSTAT ) 0.4 MG SL tablet Place 1 tablet (0.4 mg total) under the tongue every 5 (five) minutes x 3 doses as needed for chest pain. 10 tablet 0   pantoprazole  (PROTONIX ) 40 MG tablet TAKE 1 TABLET BY MOUTH EVERY DAY 90 tablet 3   triamcinolone  cream (KENALOG ) 0.1 % Apply 1 Application topically daily as needed (on affected skin.). 30 g 1   No current facility-administered medications on file prior to visit.   Past Medical History:  Diagnosis Date   CAD in native artery 06/06/2020   CAD native.  Calcium  score 176, 85th percentile 04/2020   Cataracts, bilateral    right eye - just watching   Chronic combined systolic and diastolic heart failure (HCC) 06/17/2021   Diabetes mellitus without complication (HCC)    Diverticulitis    GERD (gastroesophageal reflux disease) 2006   Hearing loss    right ear - no hearing aid   Heart attack (HCC) 2008   Hypertension    Irregular heart beat 1973   OSA on CPAP 05/04/2022   Palpitations 11/30/2019   SVD (spontaneous vaginal delivery)    x 3   Allergies  Allergen Reactions   Lipitor [Atorvastatin ] Other (See Comments)    Hair loss   Sulfonamide Derivatives     Rash/itching    Social History   Socioeconomic History   Marital status: Married    Spouse name: Not on file   Number of children: Not on file   Years of education: Not on file   Highest education level: Not on file  Occupational History   Not on file  Tobacco Use   Smoking status: Former    Current packs/day: 0.00    Average packs/day: 1.5 packs/day for 37.0 years (55.5 ttl pk-yrs)    Types: Cigarettes    Start date: 01/31/1974    Quit date:  02/01/2011    Years since quitting: 12.4   Smokeless tobacco: Never  Vaping Use   Vaping status: Never Used  Substance and Sexual Activity   Alcohol use: Not Currently    Comment: occasional glass of wine   Drug use: No    Comment: No IV drug   Sexual activity: Not Currently    Birth control/protection: Post-menopausal  Other Topics Concern   Not on file  Social History Narrative   Armed forces operational officer   Lives in [place]   3 Grown Children   Legally Separated to Second Husband         Social Drivers of Health   Financial Resource Strain: Low Risk  (11/19/2021)   Overall Financial Resource Strain (CARDIA)    Difficulty of Paying Living Expenses: Not  hard at all  Food Insecurity: Food Insecurity Present (11/20/2021)   Hunger Vital Sign    Worried About Running Out of Food in the Last Year: Sometimes true    Ran Out of Food in the Last Year: Sometimes true  Transportation Needs: No Transportation Needs (11/19/2021)   PRAPARE - Administrator, Civil Service (Medical): No    Lack of Transportation (Non-Medical): No  Physical Activity: Inactive (11/19/2021)   Exercise Vital Sign    Days of Exercise per Week: 0 days    Minutes of Exercise per Session: 0 min  Stress: No Stress Concern Present (11/19/2021)   [employer] of Occupational Health - Occupational Stress Questionnaire    Feeling of Stress : Not at all  Social Connections: Not on file   Today's Vitals   07/05/23 1111  BP: 134/80  Pulse: 76  Resp: 16  Temp: 98.7 F (37.1 C)  TempSrc: Oral  SpO2: 98%  Weight: 174 lb (78.9 kg)  Height: 5\' 3"  (1.6 m)   Body mass index is 30.82 kg/m.  Physical Exam Vitals and nursing note reviewed.  Constitutional:      General: She is not in acute distress.    Appearance: She is well-developed.  HENT:     Head: Normocephalic and atraumatic.     Mouth/Throat:     Mouth: Mucous membranes are moist.     Pharynx: Oropharynx is clear. Uvula midline.  Eyes:      Conjunctiva/sclera: Conjunctivae normal.  Cardiovascular:     Rate and Rhythm: Normal rate and regular rhythm.     Heart sounds: No murmur heard.    Comments: DP and PT pulses palpable, bilateral. Pulmonary:     Effort: Pulmonary effort is normal. No respiratory distress.     Breath sounds: Normal breath sounds.  Abdominal:     Palpations: Abdomen is soft. There is no mass.     Tenderness: There is no abdominal tenderness.  Musculoskeletal:     Right lower leg: No edema.     Left lower leg: No edema.  Lymphadenopathy:     Cervical: No cervical adenopathy.  Skin:    General: Skin is warm.     Findings: No erythema or rash.  Neurological:     General: No focal deficit present.     Mental Status: She is alert and oriented to person, place, and time.     Cranial Nerves: No cranial nerve deficit.     Motor: No pronator drift.     Gait: Gait normal.     Deep Tendon Reflexes:     Reflex Scores:      Bicep reflexes are 2+ on the right side and 2+ on the left side.      Patellar reflexes are 2+ on the right side and 2+ on the left side. Psychiatric:        Mood and Affect: Mood and affect normal.  ASSESSMENT AND PLAN:  Wendy Mosley was seen today for dizziness.  Lab Results  Component Value Date   WBC 5.5 07/05/2023   HGB 11.9 (L) 07/05/2023   HCT 38.3 07/05/2023   MCV 72.6 (L) 07/05/2023   PLT 377.0 07/05/2023   Lab Results  Component Value Date   NA 140 07/05/2023   CL 102 07/05/2023   K 4.0 07/05/2023   CO2 31 07/05/2023   BUN 12 07/05/2023   CREATININE 0.95 07/05/2023   GFR 59.66 (L) 07/05/2023   CALCIUM  9.2 07/05/2023   PHOS  4.0 06/16/2021   ALBUMIN 4.0 07/05/2023   GLUCOSE 91 07/05/2023   Lab Results  Component Value Date   ALT 15 07/05/2023   AST 15 07/05/2023   ALKPHOS 124 (H) 07/05/2023   BILITOT 0.6 07/05/2023   Lab Results  Component Value Date   TSH 1.84 07/05/2023   Dizziness We discussed possible etiologies. Hx and examination do not suggest  a serious process. She reports that problem has been going on for years and has been unchanged. Continue adequate hydration and fall precautions. I do not think brain MRI needs to be repeated. Instructed about warning signs.  -     CBC; Future -     Comprehensive metabolic panel with GFR; Future -     TSH; Future  Benign essential HTN Assessment & Plan: One time at home 154/79 but does not check BP regularly. Continue non pharmacologic treatment, states that she has tried antihypertensive medications and has had allergic reactions. Monitor BP regularly. Continue low salt diet.  Orders: -     CBC; Future -     TSH; Future  Hyperlipidemia, unspecified hyperlipidemia type Assessment & Plan: LDL is not at goal, 124 in 10/2021. She has not tolerated statins in the past. Continue Zetia  10 mg daily and low fat diet. She is not fasting today, so will plan on fasting LP next visit.  Report of chest CT reviewed.  Return if symptoms worsen or fail to improve, for keep next appointment.  I, Fritz Jewel Wierda, acting as a scribe for Eymi Lipuma [place], MD., have documented all relevant documentation on the behalf of Ariannie Penaloza [place], MD, as directed by  Dayln Tugwell [place], MD while in the presence of Jorell Agne [place], MD.   I, Jessicaann Overbaugh [place], MD, have reviewed all documentation for this visit. The documentation on 07/05/23 for the exam, diagnosis, procedures, and orders are all accurate and complete.  Cacie Gaskins G. [place], MD  [facility]. [place] office.

## 2023-07-05 NOTE — Patient Instructions (Addendum)
 A few things to remember from today's visit:  Benign essential HTN - Plan: CBC, TSH  Dizziness - Plan: CBC, Comprehensive metabolic panel with GFR, TSH  Type 2 diabetes mellitus with other specified complication, without long-term current use of insulin  (HCC) - Plan: Microalbumin / creatinine urine ratio  No changes today. Monitor blood pressure regularly. Fall precautions.  If you need refills for medications you take chronically, please call your pharmacy. Do not use My Chart to request refills or for acute issues that need immediate attention. If you send a my chart message, it may take a few days to be addressed, specially if I am not in the office.  Please be sure medication list is accurate. If a new problem present, please set up appointment sooner than planned today.

## 2023-07-05 NOTE — Assessment & Plan Note (Signed)
 LDL is not at goal, 124 in 10/2021. She has not tolerated statins in the past. Continue Zetia  10 mg daily and low fat diet. She is not fasting today, so will plan on fasting LP next visit.

## 2023-07-05 NOTE — Assessment & Plan Note (Signed)
 One time at home 154/79 but does not check BP regularly. Continue non pharmacologic treatment, states that she has tried antihypertensive medications and has had allergic reactions. Monitor BP regularly. Continue low salt diet.

## 2023-07-08 ENCOUNTER — Ambulatory Visit: Payer: Medicare Other | Admitting: Family Medicine

## 2023-07-08 ENCOUNTER — Encounter: Payer: Self-pay | Admitting: Family Medicine

## 2023-07-08 DIAGNOSIS — Z0000 Encounter for general adult medical examination without abnormal findings: Secondary | ICD-10-CM

## 2023-07-08 NOTE — Progress Notes (Signed)
 PATIENT CHECK-IN and HEALTH RISK ASSESSMENT QUESTIONNAIRE:  -completed by phone/video for upcoming Medicare Preventive Visit   Pre-Visit Check-in: 1)Vitals (height, wt, BP, etc) - record in vitals section for visit on day of visit Request home vitals (wt, BP, etc.) and enter into vitals, THEN update Vital Signs SmartPhrase below at the top of the HPI. See below.  2)Review and Update Medications, Allergies PMH, Surgeries, Social history in Epic 3)Hospitalizations in the last year with date/reason? No  4)Review and Update Care Team (patient's specialists) in Epic 5) Complete PHQ9 in Epic  6) Complete Fall Screening in Epic 7)Review all Health Maintenance Due and order under PCP if not done.  Medicare Wellness Patient Questionnaire:  Answer theses question about your habits: How often do you have a drink containing alcohol?1 glass of wine less than 1x per week How many drinks containing alcohol do you have on a typical day when you are drinking?n How often do you have six or more drinks on one occasion?never Have you ever smoked? Yes  Quit date if applicable?  20 years ago  How many packs a day do/did you smoke?  2-3 cigarettes Do you use smokeless tobacco? No Do you use an illicit drugs?No On average, how many days per week do you engage in moderate to strenuous exercise (like a brisk walk)?N/A On average, how many minutes do you engage in exercise at this level?N/A Are you sexually active? No  Typical breakfast: Varies, biscuit at [employer] or egg and toast and fruit, some coffee or tea without sweetener.   Typical lunch: leftovers from dinner  Typical dinner: Varies  Typical snacks: Chips   Beverages:  Tea, Juice   Answer theses question about your everyday activities: Can you perform most household chores? Yes Are you deaf or have significant trouble hearing? No Do you feel that you have a problem with memory? No Do you feel safe at home? Yes  Last dentist visit? 1 year  ago 8. Do you have any difficulty performing your everyday activities? No Are you having any difficulty walking, taking medications on your own, and or difficulty managing daily home needs? No Do you have difficulty walking or climbing stairs?No Do you have difficulty dressing or bathing? No Do you have difficulty doing errands alone such as visiting a doctor's office or shopping? No Do you currently have any difficulty preparing food and eating? No Do you currently have any difficulty using the toilet?No Do you have any difficulty managing your finances?No Do you have any difficulties with housekeeping of managing your housekeeping?No   Do you have Advanced Directives in place (Living Will, Healthcare Power or Attorney)?  No   Last eye Exam and location? 1 year ago    Do you currently use prescribed or non-prescribed narcotic or opioid pain medications? No  Do you have a history or close family history of breast, ovarian, tubal or peritoneal cancer or a family member with BRCA (breast cancer susceptibility 1 and 2) gene mutations? No  Nurse/Assistant Credentials/time stamp: Mg 10:09 am     ----------------------------------------------------------------------------------------------------------------------------------------------------------------------------------------------------------------------  Because this visit was a virtual/telehealth visit, some criteria may be missing or patient reported. Any vitals not documented were not able to be obtained and vitals that have been documented are patient reported.    MEDICARE ANNUAL PREVENTIVE VISIT WITH PROVIDER: (Welcome to Medicare, initial annual wellness or annual wellness exam)  Virtual Visit via Video Note  I connected with Wendy Mosley on 07/08/23 by a video enabled telemedicine  application and verified that I am speaking with the correct person using two identifiers.  Location patient: home Location provider:work or  home office Persons participating in the virtual visit: patient, provider  Concerns and/or follow up today:no changes since recent visit   See HM section in Epic for other details of completed HM.    ROS: negative for report of fevers, unintentional weight loss, vision changes, vision loss, hearing loss or change, chest pain, sob, hemoptysis, melena, hematochezia, hematuria, falls, bleeding or bruising, thoughts of suicide or self harm, memory loss  Patient-completed extensive health risk assessment - reviewed and discussed with the patient: See Health Risk Assessment completed with patient prior to the visit either above or in recent phone note. This was reviewed in detailed with the patient today and appropriate recommendations, orders and referrals were placed as needed per Summary below and patient instructions.   Review of Medical History: -PMH, PSH, Family History and current specialty and care providers reviewed and updated and listed below   Patient Care Team: [place], Betty G, MD as PCP - General (Family Medicine) Maudine Sos, MD as PCP - Cardiology (Cardiology) Nathen Balder, Skeeter Dukes, RN as Case Manager   Past Medical History:  Diagnosis Date   CAD in native artery 06/06/2020   CAD native.  Calcium  score 176, 85th percentile 04/2020   Cataracts, bilateral    right eye - just watching   Chronic combined systolic and diastolic heart failure (HCC) 06/17/2021   Diabetes mellitus without complication (HCC)    Diverticulitis    GERD (gastroesophageal reflux disease) 2006   Hearing loss    right ear - no hearing aid   Heart attack (HCC) 2008   Hypertension    Irregular heart beat 1973   OSA on CPAP 05/04/2022   Palpitations 11/30/2019   SVD (spontaneous vaginal delivery)    x 3    Past Surgical History:  Procedure Laterality Date   APPENDECTOMY  1967   BREAST SURGERY     cyst removed from right breast at age 89   CHOLECYSTECTOMY  1975   COLONOSCOPY     DILATATION &  CURETTAGE/HYSTEROSCOPY WITH MYOSURE N/A 02/21/2018   Procedure: DILATATION & CURETTAGE/HYSTEROSCOPY WITH MYOSURE RESECTION OF ENDOMETRIAL POLYP;  Surgeon: Ivery Marking, MD;  Location: [facility];  Service: Gynecology;  Laterality: N/A;   EXTERNAL EAR SURGERY  1974   TUBAL LIGATION  1977    Social History   Socioeconomic History   Marital status: Married    Spouse name: Not on file   Number of children: Not on file   Years of education: Not on file   Highest education level: Not on file  Occupational History   Not on file  Tobacco Use   Smoking status: Former    Current packs/day: 0.00    Average packs/day: 1.5 packs/day for 37.0 years (55.5 ttl pk-yrs)    Types: Cigarettes    Start date: 01/31/1974    Quit date: 02/01/2011    Years since quitting: 12.4   Smokeless tobacco: Never  Vaping Use   Vaping status: Never Used  Substance and Sexual Activity   Alcohol use: Not Currently    Comment: occasional glass of wine   Drug use: No    Comment: No IV drug   Sexual activity: Not Currently    Birth control/protection: Post-menopausal  Other Topics Concern   Not on file  Social History Narrative   Armed forces operational officer   Lives in [place]   3  Grown Children   Legally Separated to Second Husband         Social Drivers of Health   Financial Resource Strain: Low Risk  (07/08/2023)   Overall Financial Resource Strain (CARDIA)    Difficulty of Paying Living Expenses: Not hard at all  Food Insecurity: No Food Insecurity (07/08/2023)   Hunger Vital Sign    Worried About Running Out of Food in the Last Year: Never true    Ran Out of Food in the Last Year: Never true  Transportation Needs: No Transportation Needs (07/08/2023)   PRAPARE - Administrator, Civil Service (Medical): No    Lack of Transportation (Non-Medical): No  Physical Activity: Inactive (07/08/2023)   Exercise Vital Sign    Days of Exercise per Week: 0 days    Minutes of Exercise per Session: 0 min   Stress: No Stress Concern Present (07/08/2023)   [employer] of Occupational Health - Occupational Stress Questionnaire    Feeling of Stress : Not at all  Social Connections: Moderately Integrated (07/08/2023)   Social Connection and Isolation Panel [NHANES]    Frequency of Communication with Friends and Family: Twice a week    Frequency of Social Gatherings with Friends and Family: Once a week    Attends Religious Services: More than 4 times per year    Active Member of [employer] or Organizations: No    Attends Banker Meetings: Never    Marital Status: Married  Catering manager Violence: Not At Risk (07/08/2023)   Humiliation, Afraid, Rape, and Kick questionnaire    Fear of Current or Ex-Partner: No    Emotionally Abused: No    Physically Abused: No    Sexually Abused: No    Family History  Problem Relation Age of Onset   Asthma Mother    Stroke Mother    COPD Mother    Heart failure Mother    Lung cancer Sister    Cancer Maternal Grandmother        Lukemia   Diabetes Sister    Breast cancer Neg Hx     Current Outpatient Medications on File Prior to Visit  Medication Sig Dispense Refill   aspirin  EC 81 MG tablet Take 1 tablet (81 mg total) by mouth daily. Swallow whole. 90 tablet 3   B COMPLEX-ZINC PO Take 1 tablet by mouth daily.     clobetasol  ointment (TEMOVATE ) 0.05 % Apply 5 mm from tube opening, applied from the distal skin crease of the index finger to the tip at night for 30 days. 30 g 1   diclofenac  Sodium (VOLTAREN ) 1 % GEL Apply 2 g topically 4 (four) times daily. 100 g 3   ezetimibe  (ZETIA ) 10 MG tablet TAKE 1 TABLET BY MOUTH EVERY DAY 90 tablet 3   loratadine  (CLARITIN ) 10 MG tablet Take 10 mg by mouth daily as needed for allergies.     nitroGLYCERIN  (NITROSTAT ) 0.4 MG SL tablet Place 1 tablet (0.4 mg total) under the tongue every 5 (five) minutes x 3 doses as needed for chest pain. 10 tablet 0   pantoprazole  (PROTONIX ) 40 MG tablet TAKE 1 TABLET BY  MOUTH EVERY DAY 90 tablet 3   triamcinolone  cream (KENALOG ) 0.1 % Apply 1 Application topically daily as needed (on affected skin.). 30 g 1   No current facility-administered medications on file prior to visit.    Allergies  Allergen Reactions   Lipitor [Atorvastatin ] Other (See Comments)    Hair loss  Sulfonamide Derivatives     Rash/itching        Physical Exam Vitals requested from patient and listed below if patient had equipment and was able to obtain at home for this virtual visit: There were no vitals filed for this visit. Estimated body mass index is 30.82 kg/m as calculated from the following:   Height as of 07/05/23: 5\' 3"  (1.6 m).   Weight as of 07/05/23: 174 lb (78.9 kg).  EKG (optional): deferred due to virtual visit  GENERAL: alert, oriented, no acute distress detected, full vision exam deferred due to pandemic and/or virtual encounter   HEENT: atraumatic, conjunttiva clear, no obvious abnormalities on inspection of external nose and ears  NECK: normal movements of the head and neck  LUNGS: on inspection no signs of respiratory distress, breathing rate appears normal, no obvious gross SOB, gasping or wheezing  CV: no obvious cyanosis  MS: moves all visible extremities without noticeable abnormality  PSYCH/NEURO: pleasant and cooperative, no obvious depression or anxiety, speech and thought processing grossly intact, Cognitive function grossly intact  Flowsheet Row Clinical Support from 07/08/2023 in [facility] [place]  PHQ-9 Total Score 0           07/08/2023    9:59 AM 06/10/2023   11:17 AM 04/28/2023   11:44 AM 02/18/2022   11:00 AM 11/19/2021   11:41 AM  Depression screen PHQ 2/9  Decreased Interest 0 0 0 0 0  Down, Depressed, Hopeless 0 0 0 0 0  PHQ - 2 Score 0 0 0 0 0  Altered sleeping 0      Tired, decreased energy 0      Change in appetite 0      Feeling bad or failure about yourself  0      Trouble concentrating 0       Moving slowly or fidgety/restless 0      Suicidal thoughts 0      PHQ-9 Score 0      Difficult doing work/chores Not difficult at all           11/24/2021   10:24 AM 02/18/2022   11:00 AM 04/28/2023   11:44 AM 06/10/2023   11:17 AM 07/08/2023   10:00 AM  Fall Risk  Falls in the past year? 0 0 0 0 0  Was there an injury with Fall? 0 0 0 0 0  Fall Risk Category Calculator 0 0 0 0 0  Fall Risk Category (Retired) Low Low     (RETIRED) Patient Fall Risk Level Low fall risk Low fall risk     Patient at Risk for Falls Due to No Fall Risks Other (Comment) Other (Comment) No Fall Risks No Fall Risks  Fall risk Follow up Falls evaluation completed Falls evaluation completed Falls evaluation completed Falls evaluation completed Falls evaluation completed     SUMMARY AND PLAN:  Encounter for Medicare annual wellness exam   Discussed applicable health maintenance/preventive health measures and advised and referred or ordered per patient preferences: -vaccines: discussed risks/recs for each and advised can get at the pharmacy, advised to let us  know once does so that we can update record. -lung cancer screening: she had in the past, but reports quit smoking over 20 years ago and does not want to do any further -foot exam: advised and opted to do at next in office visit -bone density: had 2022 and was normal, declined at this time, plans to do later -mammogram: patient thinks had  already and would like to call on her own and see when last one was done and she will let us  know if needs orders if over 1 year. She agrees to request most recent report be sent to PCP.  Health Maintenance  Topic Date Due   Zoster Vaccines- Shingrix  (2 of 2) 07/17/2021   COVID-19 Vaccine (5 - 2024-25 season) 11/01/2022   FOOT EXAM  02/19/2023   OPHTHALMOLOGY EXAM  09/17/2023   INFLUENZA VACCINE  10/01/2023   HEMOGLOBIN A1C  10/26/2023   MAMMOGRAM  02/03/2024   Diabetic kidney evaluation - eGFR measurement   07/04/2024   Diabetic kidney evaluation - Urine ACR  07/04/2024   Medicare Annual Wellness (AWV)  07/07/2024   Colonoscopy  09/11/2029   DTaP/Tdap/Td (3 - Td or Tdap) 11/25/2031   Pneumonia Vaccine 33+ Years old  Completed   DEXA SCAN  Completed   Hepatitis C Screening  Completed   HPV VACCINES  Aged Out   Meningococcal B Vaccine  Aged Out   Lung Cancer Screening  Discontinued      Education and counseling on the following was provided based on the above review of health and a plan/checklist for the patient, along with additional information discussed, was provided for the patient in the patient instructions :  -Advised on importance of completing advanced directives, discussed options for completing and provided information in patient instructions as well -Provided counseling and plan for difficulty hearing  -Provided counseling and plan for increased risk of falling if applicable per above screening. Reviewed and demonstrated safe balance exercises that can be done at home to improve balance and discussed exercise guidelines for adults with include balance exercises at least 3 days per week.  -Advised and counseled on a healthy lifestyle - including the importance of a healthy diet, regular physical activity, social connections  -Reviewed patient's current diet. Advised and counseled on a whole foods based healthy diet. Discussed whole grain/whole food and low sugar replacements for several of her regular foods and meals. Eggs and veggies for breakfast, whole grain toast if eating a grain product, lower sugar fruits, avoiding dried and canned fruits and processed grains and snacks. Eating more veggies and replacing sweetened beverages with water.  A summary of a healthy diet was provided in the Patient Instructions.  -reviewed patient's current physical activity level and discussed exercise guidelines for adults. Discussed community resources and ideas for safe exercise at home to assist in  meeting exercise guideline recommendations in a safe and healthy way. She is thinking about starting to do exercise at home during the week and then [facility] or [facility].  -Advise yearly dental visits at minimum and regular eye exams   Follow up: see patient instructions     Patient Instructions  I really enjoyed getting to talk with you today! I am available on Tuesdays and Thursdays for virtual visits if you have any questions or concerns, or if I can be of any further assistance.   CHECKLIST FROM ANNUAL WELLNESS VISIT:  -Follow up (please call to schedule if not scheduled after visit):   -yearly for annual wellness visit with primary care office  Here is a list of your preventive care/health maintenance measures and the plan for each if any are due:  PLAN For any measures below that may be due:   Health Maintenance  Topic Date Due   Zoster Vaccines- Shingrix  (2 of 2) 07/17/2021   COVID-19 Vaccine (5 - 2024-25 season) 11/01/2022  FOOT EXAM  02/19/2023   Lung Cancer Screening  06/24/2023   OPHTHALMOLOGY EXAM  09/17/2023   INFLUENZA VACCINE  10/01/2023   HEMOGLOBIN A1C  10/26/2023   MAMMOGRAM  02/03/2024   Diabetic kidney evaluation - eGFR measurement  07/04/2024   Diabetic kidney evaluation - Urine ACR  07/04/2024   Medicare Annual Wellness (AWV)  07/07/2024   Colonoscopy  09/11/2029   DTaP/Tdap/Td (3 - Td or Tdap) 11/25/2031   Pneumonia Vaccine 66+ Years old  Completed   DEXA SCAN  Completed   Hepatitis C Screening  Completed   HPV VACCINES  Aged Out   Meningococcal B Vaccine  Aged Out    -See a dentist at least yearly  -Get your eyes checked and then per your eye specialist's recommendations  -Other issues addressed today:   -I have included below further information regarding a healthy whole foods based diet, physical activity guidelines for adults, stress management and opportunities for social connections. I hope you find this information useful.    -----------------------------------------------------------------------------------------------------------------------------------------------------------------------------------------------------------------------------------------------------------    NUTRITION: -eat real food: lots of colorful vegetables (half the plate) and fruits -5-7 servings of vegetables and fruits per day (fresh or steamed is best), exp. 2 servings of vegetables with lunch and dinner and 2 servings of fruit per day. Berries and greens such as kale and collards are great choices.  -consume on a regular basis:  fresh fruits, fresh veggies, fish, nuts, seeds, healthy oils (such as olive oil, avocado oil), whole grains (make sure for bread/pasta/crackers/etc., that the first ingredient on label contains the word "whole"), legumes. -can eat small amounts of dairy and lean meat (no larger than the palm of your hand), but avoid processed meats such as ham, bacon, lunch meat, etc. -drink water -try to avoid fast food and pre-packaged foods, processed meat, ultra processed foods/beverages (donuts, candy, etc.) -most experts advise limiting sodium to < 2300mg  per day, should limit further is any chronic conditions such as high blood pressure, heart disease, diabetes, etc. The American Heart Association advised that < 1500mg  is is ideal -try to avoid foods/beverages that contain any ingredients with names you do not recognize  -try to avoid foods/beverages  with added sugar or sweeteners/sweets  -try to avoid sweet drinks (including diet drinks): soda, juice, Gatorade, sweet tea, power drinks, diet drinks -try to avoid white rice, white bread, pasta (unless whole grain)  EXERCISE GUIDELINES FOR ADULTS: -if you wish to increase your physical activity, do so gradually and with the approval of your doctor -STOP and seek medical care immediately if you have any chest pain, chest discomfort or trouble breathing when starting or  increasing exercise  -move and stretch your body, legs, feet and arms when sitting for long periods -Physical activity guidelines for optimal health in adults: -get at least 150 minutes per week of moderate exercise (can talk, but not sing); this is about 20-30 minutes of sustained activity 5-7 days per week or two 10-15 minute episodes of sustained activity 5-7 days per week -do some muscle building/resistance training/strength training at least 2 days per week  -balance exercises 3+ days per week:   Stand somewhere where you have something sturdy to hold onto if you lose balance    1) lift up on toes, then back down, start with 5x per day and work up to 20x   2) stand and lift one leg straight out to the side so that foot is a few inches of the floor, start with 5x each  side and work up to 20x each side   3) stand on one foot, start with 5 seconds each side and work up to 20 seconds on each side  If you need ideas or help with getting more active:  -Silver sneakers https://tools.silversneakers.com  -Walk with a Doc: http://www.duncan-williams.com/  -try to include resistance (weight lifting/strength building) and balance exercises twice per week: or the following link for ideas: http://castillo-powell.com/  BuyDucts.dk  STRESS MANAGEMENT: -can try meditating, or just sitting quietly with deep breathing while intentionally relaxing all parts of your body for 5 minutes daily -if you need further help with stress, anxiety or depression please follow up with your primary doctor or contact the wonderful folks at [facility]: [phone]  SOCIAL CONNECTIONS: -options in [place] if you wish to engage in more social and exercise related activities:  -Silver sneakers https://tools.silversneakers.com  -Walk with a Doc: http://www.duncan-williams.com/  -Check out the [facility] Active Adults 50+  section on the [place] of [employer] (hiking clubs, book clubs, cards and games, chess, exercise classes, aquatic classes and much more) - see the website for details: https://www.[place].gov/departments/parks-recreation/active-adults50  -YouTube has lots of exercise videos for different ages and abilities as well  -[facility] (a variety of indoor and outdoor inperson activities for adults). [phone]. [address]  -Virtual Online Classes (a variety of topics): see seniorplanet.org or call [phone]  -consider volunteering at a school, hospice center, church, senior center or elsewhere     ADVANCED HEALTHCARE DIRECTIVES:  [place] Advanced Directives assistance:   ExpressWeek.com.cy  Everyone should have advanced health care directives in place. This is so that you get the care you want, should you ever be in a situation where you are unable to make your own medical decisions.   From the [place] Advanced Directive Website: "Advance Health Care Directives are legal documents in which you give written instructions about your health care if, in the future, you cannot speak for yourself.   A health care power of attorney allows you to name a person you trust to make your health care decisions if you cannot make them yourself. A declaration of a desire for a natural death (or living will) is document, which states that you desire not to have your life prolonged by extraordinary measures if you have a terminal or incurable illness or if you are in a vegetative state. An advance instruction for mental health treatment makes a declaration of instructions, information and preferences regarding your mental health treatment. It also states that you are aware that the advance instruction authorizes a mental health treatment provider to act according to your wishes. It may also outline your consent or refusal of  mental health treatment. A declaration of an anatomical gift allows anyone over the age of 84 to make a gift by will, organ donor card or other document."   Please see the following website or an elder law attorney for forms, FAQs and for completion of advanced directives: [place]  Print production planner Health Care Directives Advance Health Care Directives (http://guzman.[email or web address]/)  Or copy and paste the following to your web browser: [email or web address]         Maurie Southern, DO

## 2023-07-08 NOTE — Patient Instructions (Signed)
 I really enjoyed getting to talk with you today! I am available on Tuesdays and Thursdays for virtual visits if you have any questions or concerns, or if I can be of any further assistance.   CHECKLIST FROM ANNUAL WELLNESS VISIT:  -Follow up (please call to schedule if not scheduled after visit):   -yearly for annual wellness visit with primary care office  Here is a list of your preventive care/health maintenance measures and the plan for each if any are due:  PLAN For any measures below that may be due:   Health Maintenance  Topic Date Due   Zoster Vaccines- Shingrix  (2 of 2) 07/17/2021   COVID-19 Vaccine (5 - 2024-25 season) 11/01/2022   FOOT EXAM  02/19/2023   Lung Cancer Screening  06/24/2023   OPHTHALMOLOGY EXAM  09/17/2023   INFLUENZA VACCINE  10/01/2023   HEMOGLOBIN A1C  10/26/2023   MAMMOGRAM  02/03/2024   Diabetic kidney evaluation - eGFR measurement  07/04/2024   Diabetic kidney evaluation - Urine ACR  07/04/2024   Medicare Annual Wellness (AWV)  07/07/2024   Colonoscopy  09/11/2029   DTaP/Tdap/Td (3 - Td or Tdap) 11/25/2031   Pneumonia Vaccine 5+ Years old  Completed   DEXA SCAN  Completed   Hepatitis C Screening  Completed   HPV VACCINES  Aged Out   Meningococcal B Vaccine  Aged Out    -See a dentist at least yearly  -Get your eyes checked and then per your eye specialist's recommendations  -Other issues addressed today:   -I have included below further information regarding a healthy whole foods based diet, physical activity guidelines for adults, stress management and opportunities for social connections. I hope you find this information useful.   -----------------------------------------------------------------------------------------------------------------------------------------------------------------------------------------------------------------------------------------------------------    NUTRITION: -eat real food: lots of colorful vegetables  (half the plate) and fruits -5-7 servings of vegetables and fruits per day (fresh or steamed is best), exp. 2 servings of vegetables with lunch and dinner and 2 servings of fruit per day. Berries and greens such as kale and collards are great choices.  -consume on a regular basis:  fresh fruits, fresh veggies, fish, nuts, seeds, healthy oils (such as olive oil, avocado oil), whole grains (make sure for bread/pasta/crackers/etc., that the first ingredient on label contains the word "whole"), legumes. -can eat small amounts of dairy and lean meat (no larger than the palm of your hand), but avoid processed meats such as ham, bacon, lunch meat, etc. -drink water -try to avoid fast food and pre-packaged foods, processed meat, ultra processed foods/beverages (donuts, candy, etc.) -most experts advise limiting sodium to < 2300mg  per day, should limit further is any chronic conditions such as high blood pressure, heart disease, diabetes, etc. The American Heart Association advised that < 1500mg  is is ideal -try to avoid foods/beverages that contain any ingredients with names you do not recognize  -try to avoid foods/beverages  with added sugar or sweeteners/sweets  -try to avoid sweet drinks (including diet drinks): soda, juice, Gatorade, sweet tea, power drinks, diet drinks -try to avoid white rice, white bread, pasta (unless whole grain)  EXERCISE GUIDELINES FOR ADULTS: -if you wish to increase your physical activity, do so gradually and with the approval of your doctor -STOP and seek medical care immediately if you have any chest pain, chest discomfort or trouble breathing when starting or increasing exercise  -move and stretch your body, legs, feet and arms when sitting for long periods -Physical activity guidelines for optimal health in adults: -  get at least 150 minutes per week of moderate exercise (can talk, but not sing); this is about 20-30 minutes of sustained activity 5-7 days per week or two  10-15 minute episodes of sustained activity 5-7 days per week -do some muscle building/resistance training/strength training at least 2 days per week  -balance exercises 3+ days per week:   Stand somewhere where you have something sturdy to hold onto if you lose balance    1) lift up on toes, then back down, start with 5x per day and work up to 20x   2) stand and lift one leg straight out to the side so that foot is a few inches of the floor, start with 5x each side and work up to 20x each side   3) stand on one foot, start with 5 seconds each side and work up to 20 seconds on each side  If you need ideas or help with getting more active:  -Silver sneakers https://tools.silversneakers.com  -Walk with a Doc: http://www.duncan-williams.com/  -try to include resistance (weight lifting/strength building) and balance exercises twice per week: or the following link for ideas: http://castillo-powell.com/  BuyDucts.dk  STRESS MANAGEMENT: -can try meditating, or just sitting quietly with deep breathing while intentionally relaxing all parts of your body for 5 minutes daily -if you need further help with stress, anxiety or depression please follow up with your primary doctor or contact the wonderful folks at [facility]: [phone]  SOCIAL CONNECTIONS: -options in [place] if you wish to engage in more social and exercise related activities:  -Silver sneakers https://tools.silversneakers.com  -Walk with a Doc: http://www.duncan-williams.com/  -Check out the [facility] Active Adults 50+ section on the [place] [employer] (hiking clubs, book clubs, cards and games, chess, exercise classes, aquatic classes and much more) - see the website for details: https://www.-[place].gov/departments/parks-recreation/active-adults50  -YouTube has lots of exercise videos for different ages and  abilities as well  -[facility] (a variety of indoor and outdoor inperson activities for adults). [phone]. [address]  -Virtual Online Classes (a variety of topics): see seniorplanet.org or call [phone]  -consider volunteering at a school, hospice center, church, senior center or elsewhere     ADVANCED HEALTHCARE DIRECTIVES:  [place] Advanced Directives assistance:   ExpressWeek.com.cy  Everyone should have advanced health care directives in place. This is so that you get the care you want, should you ever be in a situation where you are unable to make your own medical decisions.   From the [place] Advanced Directive Website: "Advance Health Care Directives are legal documents in which you give written instructions about your health care if, in the future, you cannot speak for yourself.   A health care power of attorney allows you to name a person you trust to make your health care decisions if you cannot make them yourself. A declaration of a desire for a natural death (or living will) is document, which states that you desire not to have your life prolonged by extraordinary measures if you have a terminal or incurable illness or if you are in a vegetative state. An advance instruction for mental health treatment makes a declaration of instructions, information and preferences regarding your mental health treatment. It also states that you are aware that the advance instruction authorizes a mental health treatment provider to act according to your wishes. It may also outline your consent or refusal of mental health treatment. A declaration of an anatomical gift allows anyone over the age of 36  to make a gift by will, organ donor card or other document."   Please see the following website or an elder law attorney for forms, FAQs and for completion of advanced directives:   Designer, multimedia Health Care Directives Advance Health Care Directives (http://guzman.com/)  Or copy and paste the following to your web browser: PoshChat.fi

## 2023-07-08 NOTE — Progress Notes (Signed)
 Patient unable to obtain vital signs due to virtual visit

## 2023-07-15 ENCOUNTER — Ambulatory Visit: Admitting: Obstetrics and Gynecology

## 2023-07-15 ENCOUNTER — Ambulatory Visit (INDEPENDENT_AMBULATORY_CARE_PROVIDER_SITE_OTHER)

## 2023-07-15 ENCOUNTER — Other Ambulatory Visit: Payer: Self-pay | Admitting: Obstetrics and Gynecology

## 2023-07-15 ENCOUNTER — Encounter: Payer: Self-pay | Admitting: Obstetrics and Gynecology

## 2023-07-15 VITALS — BP 124/70 | HR 78 | Wt 174.0 lb

## 2023-07-15 DIAGNOSIS — N904 Leukoplakia of vulva: Secondary | ICD-10-CM | POA: Diagnosis not present

## 2023-07-15 DIAGNOSIS — N83201 Unspecified ovarian cyst, right side: Secondary | ICD-10-CM | POA: Diagnosis not present

## 2023-07-15 DIAGNOSIS — L292 Pruritus vulvae: Secondary | ICD-10-CM

## 2023-07-15 DIAGNOSIS — R9389 Abnormal findings on diagnostic imaging of other specified body structures: Secondary | ICD-10-CM | POA: Diagnosis not present

## 2023-07-15 NOTE — Progress Notes (Signed)
 73 y.o. [id number] postmenopausal female with fibroids, right adnexal lesion on ultrasound, vulvar pruritus here for ultrasound. Married.  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  At 06/10/2023 appointment, she noted: Persistent vulvar pruritus - vaginal irritation after taking antibiotics last month. Started out as yeast infection but then returned with irritations. Used monistat and vagisil without relief. Also used alcohol to calm the irritation down but still irritated.   Of note she had a TVUS performed June 2024 due to pelvic pain which revealed a 2 cm fibroid, 7 mm endometrial thickness, 7 mm right adnexal calcification.  She was referred by her PCP for GYN care, however she has not been seen by gynecologist.  She denies any pelvic pain or postmenopausal bleeding today.  Reports a known history of fibroids.  Today she reports using clobetasol  with improvement.  GYN HISTORY: Fibroids  OB History  Gravida Para Term Preterm AB Living  5 3 3  2 3   SAB IAB Ectopic Multiple Live Births   2   3    # Outcome Date GA Lbr Len/2nd Weight Sex Type Anes PTL Lv  5 IAB           4 IAB           3 Term      Vag-Spont   LIV  2 Term      Vag-Spont   LIV  1 Term      Vag-Spont   LIV    Obstetric Comments  Last Period - 73 yo  No post menopausal bleeding  No abnormal PAP smears reported - Unsure.    Past Medical History:  Diagnosis Date   CAD in native artery 06/06/2020   CAD native.  Calcium  score 176, 85th percentile 04/2020   Cataracts, bilateral    right eye - just watching   Chronic combined systolic and diastolic heart failure (HCC) 06/17/2021   Diabetes mellitus without complication (HCC)    Diverticulitis    GERD (gastroesophageal reflux disease) 2006   Hearing loss    right ear - no hearing aid   Heart attack (HCC) 2008   Hypertension    Irregular heart beat 1973   OSA on CPAP 05/04/2022   Palpitations 11/30/2019   SVD (spontaneous vaginal delivery)    x 3    Past Surgical History:   Procedure Laterality Date   APPENDECTOMY  1967   BREAST SURGERY     cyst removed from right breast at age 28   CHOLECYSTECTOMY  1975   COLONOSCOPY     DILATATION & CURETTAGE/HYSTEROSCOPY WITH MYOSURE N/A 02/21/2018   Procedure: DILATATION & CURETTAGE/HYSTEROSCOPY WITH MYOSURE RESECTION OF ENDOMETRIAL POLYP;  Surgeon: Ivery Marking, MD;  Location: [facility];  Service: Gynecology;  Laterality: N/A;   EXTERNAL EAR SURGERY  1974   TUBAL LIGATION  1977    Current Outpatient Medications on File Prior to Visit  Medication Sig Dispense Refill   aspirin  EC 81 MG tablet Take 1 tablet (81 mg total) by mouth daily. Swallow whole. 90 tablet 3   B COMPLEX-ZINC PO Take 1 tablet by mouth daily.     clobetasol  ointment (TEMOVATE ) 0.05 % Apply 5 mm from tube opening, applied from the distal skin crease of the index finger to the tip at night for 30 days. 30 g 1   diclofenac  Sodium (VOLTAREN ) 1 % GEL Apply 2 g topically 4 (four) times daily. 100 g 3   ezetimibe  (ZETIA ) 10 MG tablet TAKE 1 TABLET BY MOUTH EVERY DAY 90  tablet 3   loratadine  (CLARITIN ) 10 MG tablet Take 10 mg by mouth daily as needed for allergies.     MAGNESIUM PO Take by mouth.     nitroGLYCERIN  (NITROSTAT ) 0.4 MG SL tablet Place 1 tablet (0.4 mg total) under the tongue every 5 (five) minutes x 3 doses as needed for chest pain. 10 tablet 0   pantoprazole  (PROTONIX ) 40 MG tablet TAKE 1 TABLET BY MOUTH EVERY DAY 90 tablet 3   triamcinolone  cream (KENALOG ) 0.1 % Apply 1 Application topically daily as needed (on affected skin.). 30 g 1   No current facility-administered medications on file prior to visit.    Allergies  Allergen Reactions   Lipitor [Atorvastatin ] Other (See Comments)    Hair loss   Sulfonamide Derivatives     Rash/itching       PE Today's Vitals   07/15/23 1135  BP: 124/70  Pulse: 78  TempSrc: Oral  SpO2: (!) 68%  Weight: 174 lb (78.9 kg)   Body mass index is 30.82 kg/m.  Physical Exam Vitals reviewed.   Constitutional:      General: She is not in acute distress.    Appearance: Normal appearance.  HENT:     Head: Normocephalic and atraumatic.     Nose: Nose normal.  Eyes:     Extraocular Movements: Extraocular movements intact.     Conjunctiva/sclera: Conjunctivae normal.  Pulmonary:     Effort: Pulmonary effort is normal.  Abdominal:     General: There is distension.     Palpations: Abdomen is soft.     Tenderness: There is no abdominal tenderness. There is no guarding or rebound.  Musculoskeletal:        General: Normal range of motion.     Cervical back: Normal range of motion.  Neurological:     General: No focal deficit present.     Mental Status: She is alert.  Psychiatric:        Mood and Affect: Mood normal.        Behavior: Behavior normal.     07/15/23 TVUS: Indications: Right adnexal lesion  Findings:   Uterus: 1.6 x 3.7 x 2.9 cm, anteverted uterus.  2.4 x 2.3 cm intramural fibroid noted. Endometrial thickness: 4 mm. Left ovary: 1.7 x 1.2 x 1.2 cm, normal-appearing. Right ovary: 1.7 x 1.3 x 1.2 cm, 7 x 7 mm avascular echogenic lesion of right ovary noted. No free fluid.  Impression:  7 mm hyperechoic lesion of left ovary, consistent with ovarian dermoid cyst.  Stable when compared to 08/24/2022 TVUS.  Romaine Closs, MD    Assessment and Plan:        Right ovarian cyst Assessment & Plan: Reviewed TVUS with patient. right ovarian hyperechoic lesion is most consistent with a benign dermoid cyst. Unchanged ultrasound since June 2024. Patient inquired about removal.  After risk and benefits discussion of a benign behaving ovarian cyst, we decided to continue with ultrasound surveillance for another year. Return to office in 1 year for annual with ultrasound  Orders: -     US  PELVIS TRANSVAGINAL NON-OB (TV ONLY); Future  Lichen sclerosus et atrophicus of the vulva Assessment & Plan: Continue clobetasol  use weekly.     Romaine Closs, MD

## 2023-07-15 NOTE — Assessment & Plan Note (Signed)
 Reviewed TVUS with patient. right ovarian hyperechoic lesion is most consistent with a benign dermoid cyst. Unchanged ultrasound since June 2024. Patient inquired about removal.  After risk and benefits discussion of a benign behaving ovarian cyst, we decided to continue with ultrasound surveillance for another year. Return to office in 1 year for annual with ultrasound

## 2023-07-15 NOTE — Assessment & Plan Note (Signed)
 Continue clobetasol  use weekly.

## 2023-07-19 ENCOUNTER — Other Ambulatory Visit: Payer: Self-pay | Admitting: Acute Care

## 2023-07-19 DIAGNOSIS — Z87891 Personal history of nicotine dependence: Secondary | ICD-10-CM

## 2023-07-19 DIAGNOSIS — Z122 Encounter for screening for malignant neoplasm of respiratory organs: Secondary | ICD-10-CM

## 2023-09-23 DIAGNOSIS — Z961 Presence of intraocular lens: Secondary | ICD-10-CM | POA: Diagnosis not present

## 2023-09-23 DIAGNOSIS — H04123 Dry eye syndrome of bilateral lacrimal glands: Secondary | ICD-10-CM | POA: Diagnosis not present

## 2023-09-23 DIAGNOSIS — H43393 Other vitreous opacities, bilateral: Secondary | ICD-10-CM | POA: Diagnosis not present

## 2023-09-23 DIAGNOSIS — E119 Type 2 diabetes mellitus without complications: Secondary | ICD-10-CM | POA: Diagnosis not present

## 2023-09-23 LAB — HM DIABETES EYE EXAM

## 2023-09-24 ENCOUNTER — Encounter: Payer: Self-pay | Admitting: Family Medicine

## 2023-10-05 ENCOUNTER — Other Ambulatory Visit: Payer: Self-pay | Admitting: Family Medicine

## 2023-10-05 DIAGNOSIS — L293 Anogenital pruritus, unspecified: Secondary | ICD-10-CM

## 2023-10-05 NOTE — Telephone Encounter (Unsigned)
 Copied from CRM #[id number]. Topic: Clinical - Medication Refill >> Oct 05, 2023  5:01 PM Shereese L wrote: Medication:clobetasol  ointment (TEMOVATE ) 0.05 %  Has the patient contacted their pharmacy? Yes (Agent: If no, request that the patient contact the pharmacy for the refill. If patient does not wish to contact the pharmacy document the reason why and proceed with request.) (Agent: If yes, when and what did the pharmacy advise?)  This is the patient's preferred pharmacy:  [id number] - [place], [place] - [address] [place] [address] Phone: [phone] Fax: [phone]  Is this the correct pharmacy for this prescription? Yes If no, delete pharmacy and type the correct one.   Has the prescription been filled recently? Yes  Is the patient out of the medication? Yes  Has the patient been seen for an appointment in the last year OR does the patient have an upcoming appointment? Yes  Can we respond through MyChart? Yes  Agent: Please be advised that Rx refills may take up to 3 business days. We ask that you follow-up with your pharmacy.

## 2023-10-06 ENCOUNTER — Other Ambulatory Visit: Payer: Self-pay | Admitting: Obstetrics and Gynecology

## 2023-10-06 DIAGNOSIS — L293 Anogenital pruritus, unspecified: Secondary | ICD-10-CM

## 2023-10-06 MED ORDER — CLOBETASOL PROPIONATE 0.05 % EX OINT: TOPICAL_OINTMENT | CUTANEOUS | 1 refills | Status: AC

## 2023-10-06 NOTE — Telephone Encounter (Signed)
 Copied from CRM #[id number]. Topic: Clinical - Medication Refill >> Oct 06, 2023  3:03 PM Drema MATSU wrote: Patient states that she is leaving to go out of town on tomorrow and is needing the medication. Please call patient.

## 2023-10-07 NOTE — Telephone Encounter (Signed)
 Med refill request: Temovate   Last AEX: last OV 07/15/23 Next AEX: not scheduled  Last MMG (if hormonal med) 02/02/22 BIRADS Cat 1 neg  Refill authorized: Rx was prescribed by PCP on 10/06/23. Refill not appropriate. Routing to provider for review.

## 2023-12-22 NOTE — Progress Notes (Unsigned)
 Wendy Mosley                                          MRN: [id number]   12/22/2023   The VBCI Quality Team Specialist reviewed this patient medical record for the purposes of chart review for care gap closure. The following were reviewed: {CHL AMB VBCI QUALITY SPECIALIST REASON FOR REVIEW:[id number]}.    VBCI Quality Team

## 2023-12-30 ENCOUNTER — Ambulatory Visit: Payer: Self-pay

## 2023-12-30 NOTE — Telephone Encounter (Signed)
 FYI Only or Action Required?: FYI only for provider: appointment scheduled on 01/03/2024.  Patient was last seen in primary care on 07/08/2023 by Luke Chiquita SAUNDERS, DO.  Called Nurse Triage reporting Dizziness.  Symptoms began a week ago.  Interventions attempted: Nothing.  Symptoms are: gradually worsening.  Triage Disposition: See PCP When Office is Open (Within 3 Days)  Patient/caregiver understands and will follow disposition?: Yes       Copied from CRM #[id number]. Topic: Clinical - Red Word Triage >> Dec 30, 2023  1:22 PM Leah C wrote: Red Word that prompted transfer to Nurse Triage: Dizziness, hearing issues with left ear- whooshing sound about a week Reason for Disposition  [1] MILD dizziness (e.g., vertigo; walking normally) AND [2] has NOT been evaluated by doctor (or NP/PA) for this  Answer Assessment - Initial Assessment Questions 1. DESCRIPTION: Describe your dizziness.     Mild in nature  2. VERTIGO: Do you feel like either you or the room is spinning or tilting?      Denies  3. LIGHTHEADED: Do you feel lightheaded? (e.g., somewhat faint, woozy, weak upon standing)     Has felt light headed on and off for the past week  4. SEVERITY: How bad is it?  Can you walk?     Mild  5. ONSET:  When did the dizziness begin?     1 week ago  6. CAUSE: What do you think is causing the dizziness?     Unsure  7. RECURRENT SYMPTOM: Have you had dizziness before? If Yes, ask: When was the last time? What happened that time?     She states she has had similar symptoms when having a high fever in the past.  8. OTHER SYMPTOMS: Do you have any other symptoms? (e.g., earache, headache, numbness, tinnitus, vomiting, weakness)     Hearing issues in L ear, hears a whooshing sound  Protocols used: Dizziness - Vertigo-A-AH

## 2024-01-03 ENCOUNTER — Ambulatory Visit (INDEPENDENT_AMBULATORY_CARE_PROVIDER_SITE_OTHER)
Admission: RE | Admit: 2024-01-03 | Discharge: 2024-01-03 | Disposition: A | Discharge status: Home / Self Care | Source: Ambulatory Visit | Attending: Family Medicine | Admitting: Family Medicine

## 2024-01-03 ENCOUNTER — Telehealth: Payer: Self-pay

## 2024-01-03 ENCOUNTER — Encounter: Payer: Self-pay | Admitting: Family Medicine

## 2024-01-03 ENCOUNTER — Ambulatory Visit (INDEPENDENT_AMBULATORY_CARE_PROVIDER_SITE_OTHER): Admitting: Family Medicine

## 2024-01-03 VITALS — BP 142/84 | HR 80 | Temp 98.6°F | Ht 63.0 in

## 2024-01-03 DIAGNOSIS — E1169 Type 2 diabetes mellitus with other specified complication: Secondary | ICD-10-CM

## 2024-01-03 DIAGNOSIS — M25511 Pain in right shoulder: Secondary | ICD-10-CM

## 2024-01-03 DIAGNOSIS — R519 Headache, unspecified: Secondary | ICD-10-CM | POA: Diagnosis not present

## 2024-01-03 DIAGNOSIS — R42 Dizziness and giddiness: Secondary | ICD-10-CM | POA: Diagnosis not present

## 2024-01-03 DIAGNOSIS — G8929 Other chronic pain: Secondary | ICD-10-CM

## 2024-01-03 DIAGNOSIS — I10 Essential (primary) hypertension: Secondary | ICD-10-CM | POA: Diagnosis not present

## 2024-01-03 DIAGNOSIS — G4733 Obstructive sleep apnea (adult) (pediatric): Secondary | ICD-10-CM

## 2024-01-03 LAB — POCT GLYCOSYLATED HEMOGLOBIN (HGB A1C): Hemoglobin A1C: 6.2 % — AB (ref 4.0–5.6)

## 2024-01-03 NOTE — Progress Notes (Signed)
 Established Patient Office Visit   Subjective  Patient ID: Wendy Mosley, female    DOB: 03-24-1950  Age: 73 y.o. MRN: [id number]  Chief Complaint  Patient presents with   Acute Visit    Dizziness, started 1 month ago, headaches, head feels heavy, unsteady gait     Pt is a 73 yo female followed by Dr. Jordan and seen for ongoing concerns.  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 This sensation has caused her to grab onto seats to prevent falling.  This has been an ongoing problem.  Endorses headaches, which she attributes to dental issues, as she is trying to arrange for teeth extractions. The headaches are located in the temples and center of head, and occasionally in R occipital area.  Has noticed visual disturbances described as 'lightning bolts.' Eating or drinking often alleviates these symptoms.  HTN:   Home bp readings ranging from 160s to 170s. Her blood pressure today was 142/84. Not on bp medication.  Per pt states she is allergic to all the ones tried, though she cannot recall which ones and none are listed on allergies.  She experienced a stroke in the past.  Eating chips while on the bus as a snack.  H/o DM II with a previous A1c of 6.7% on 05/22/21 per chart review.  Current A1c is 6.2%. She does not eat well, consuming sweets and high-carbohydrate foods like mashed potatoes and french fries. She drinks tea with matcha and ginger, and occasionally apple juice, but only drinks about two bottles of water a day. Not currently on meds.  She does not use her CPAP machine due to mold in her house, which she believes affects her health. She has been sleeping on the sofa to avoid the mold in her bedroom.  She describes generalized body aches, particularly in her shoulders and legs. 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 She uses topical treatments  like Voltaren  gel as needed, though she is not using the dosing card.  She reports chest soreness following a recent cold, with pain exacerbated by coughing.    Patient Active Problem List   Diagnosis Date Noted   Right ovarian cyst 07/15/2023   Abnormal ultrasound of pelvis 06/10/2023   Atherosclerosis of aorta 07/24/2022   OSA on CPAP 05/04/2022   Statin myopathy 05/04/2022   Headache, unspecified headache type 02/18/2022   Chronic combined systolic and diastolic heart failure (HCC) 06/17/2021   Chest pain 06/15/2021   SVT (supraventricular tachycardia) 06/15/2021   Benign essential HTN 06/15/2021   Tobacco use 06/15/2021   Type 2 diabetes mellitus with other specified complication (HCC) 06/15/2021   CAD in native artery 06/06/2020   Chronic right shoulder pain 05/15/2020   Palpitations 11/30/2019   Constipation 09/15/2018   Colonic polyp 12/24/2016   Hyperlipidemia, unspecified 11/14/2015   HTN (hypertension) 11/12/2015   Arthritis of right knee 11/12/2015   Dermatitis 11/12/2015   Allergic rhinitis 07/27/2012   Lichen sclerosus et atrophicus of the vulva 07/12/2011   GERD (gastroesophageal reflux disease) 07/02/2011   Postmenopausal atrophic vaginitis 07/02/2011   Past Medical History:  Diagnosis Date   CAD in native artery 06/06/2020   CAD native.  Calcium  score 176, 85th percentile 04/2020   Cataracts, bilateral    right eye - just watching   Chronic combined systolic and diastolic heart failure (HCC)  06/17/2021   Diabetes mellitus without complication (HCC)    Diverticulitis    GERD (gastroesophageal reflux disease) 2006   Hearing loss    right ear - no hearing aid   Heart attack (HCC) 2008   Hypertension    Irregular heart beat 1973   OSA on CPAP 05/04/2022   Palpitations 11/30/2019   SVD (spontaneous vaginal delivery)    x 3   Past Surgical History:  Procedure Laterality Date   APPENDECTOMY  1967   BREAST SURGERY     cyst removed from right breast at  age 74   CHOLECYSTECTOMY  1975   COLONOSCOPY     DILATATION & CURETTAGE/HYSTEROSCOPY WITH MYOSURE N/A 02/21/2018   Procedure: DILATATION & CURETTAGE/HYSTEROSCOPY WITH MYOSURE RESECTION OF ENDOMETRIAL POLYP;  Surgeon: Rutherford Gain, MD;  Location: [facility];  Service: Gynecology;  Laterality: N/A;   EXTERNAL EAR SURGERY  1974   TUBAL LIGATION  1977   Social History   Tobacco Use   Smoking status: Former    Current packs/day: 0.00    Average packs/day: 1.5 packs/day for 37.0 years (55.5 ttl pk-yrs)    Types: Cigarettes    Start date: 01/31/1974    Quit date: 02/01/2011    Years since quitting: 12.9   Smokeless tobacco: Never  Vaping Use   Vaping status: Never Used  Substance Use Topics   Alcohol use: Not Currently    Comment: occasional glass of wine   Drug use: No    Comment: No IV drug   Family History  Problem Relation Age of Onset   Asthma Mother    Stroke Mother    COPD Mother    Heart failure Mother    Lung cancer Sister    Cancer Maternal Grandmother        Lukemia   Diabetes Sister    Breast cancer Neg Hx    Allergies  Allergen Reactions   Lipitor [Atorvastatin ] Other (See Comments)    Hair loss   Sulfonamide Derivatives     Rash/itching     ROS Negative unless stated above    Objective:     BP (!) 142/84 (BP Location: Left Arm, Patient Position: Sitting, Cuff Size: Large)   Pulse 80   Temp 98.6 F (37 C) (Oral)   Ht 5' 3 (1.6 m)   SpO2 99%   BMI 30.82 kg/m  BP Readings from Last 3 Encounters:  01/03/24 (!) 142/84  07/15/23 124/70  07/05/23 134/80   Wt Readings from Last 3 Encounters:  07/15/23 174 lb (78.9 kg)  07/05/23 174 lb (78.9 kg)  06/10/23 169 lb (76.7 kg)      Physical Exam Constitutional:      General: She is not in acute distress.    Appearance: Normal appearance.  HENT:     Head: Normocephalic and atraumatic.     Nose: Nose normal.     Mouth/Throat:     Mouth: Mucous membranes are moist.  Cardiovascular:     Rate  and Rhythm: Normal rate and regular rhythm.     Heart sounds: Normal heart sounds. No murmur heard.    No gallop.  Pulmonary:     Effort: Pulmonary effort is normal. No respiratory distress.     Breath sounds: Normal breath sounds. No wheezing, rhonchi or rales.  Musculoskeletal:     Right shoulder: Tenderness and bony tenderness present.     Comments: Pain with active and passive motion.  +empty can on R, crossbody, Neer's, Hawkin's.  Skin:    General: Skin is warm and dry.  Neurological:     Mental Status: She is alert and oriented to person, place, and time.        07/08/2023    9:59 AM 06/10/2023   11:17 AM 04/28/2023   11:44 AM  Depression screen PHQ 2/9  Decreased Interest 0 0 0  Down, Depressed, Hopeless 0 0 0  PHQ - 2 Score 0 0 0  Altered sleeping 0    Tired, decreased energy 0    Change in appetite 0    Feeling bad or failure about yourself  0    Trouble concentrating 0    Moving slowly or fidgety/restless 0    Suicidal thoughts 0    PHQ-9 Score 0    Difficult doing work/chores Not difficult at all        03/29/2017    4:26 PM 12/24/2016   10:41 AM 09/08/2016    3:07 PM 07/22/2016    9:15 AM  GAD 7 : Generalized Anxiety Score  Nervous, Anxious, on Edge 0 0 0 0  Control/stop worrying 0 0 0 0  Worry too much - different things 0 0 0 0  Trouble relaxing 0 0 0 0  Restless 0 0 0 0  Easily annoyed or irritable 0 0 0 0  Afraid - awful might happen 0 0 0 0  Total GAD 7 Score 0 0 0 0     Results for orders placed or performed in visit on 01/03/24  POC HgB A1c  Result Value Ref Range   Hemoglobin A1C 6.2 (A) 4.0 - 5.6 %   HbA1c POC (<> result, manual entry)     HbA1c, POC (prediabetic range)     HbA1c, POC (controlled diabetic range)        Assessment & Plan:   Dizziness  Headache, unspecified headache type  Essential hypertension  Type 2 diabetes mellitus with other specified complication, without long-term current use of insulin  (HCC) -     POCT  glycosylated hemoglobin (Hb A1C)  OSA on CPAP  Chronic right shoulder pain -     DG Shoulder Right; Future  Dizziness likely multifactorial including uncontrolled HTN, elevated bs, not wearing CPAP.  Discussed the importance of limiting carbs and sugar inake.  A1C this visit 6.2%.  not currently on bp meds.  Lifestyle modifications strongly encouraged given h/o CVA.  Pt advised to get away from mold/have it professionally removed/treated and start wearing CPAP.  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  Xray ordered, however tech not available in clinic this visit.  Voltaren  gel TID prn using dosing card.       I personally spent a total of 41 minutes in the care of the patient today including preparing to see the patient, getting/reviewing separately obtained history, performing a medically appropriate exam/evaluation, counseling and educating, placing orders, documenting clinical information in the EHR, independently interpreting results, and communicating results.  Return in about 3 weeks (around 01/24/2024) for for follow up with pcp, Dr. Jordan.   Clotilda JONELLE Single, MD

## 2024-01-03 NOTE — Telephone Encounter (Signed)
 Was seen by Dr. Mercer today.  Copied from CRM [id number]. Topic: Clinical - Medical Advice >> Jan 03, 2024  3:08 PM Brittany M wrote: Reason for CRM: Patent wanting a nurse to contact her to talk about something she forgot to mention at her visit with Dr Mercer

## 2024-01-04 ENCOUNTER — Encounter: Payer: Self-pay | Admitting: Family Medicine

## 2024-01-04 ENCOUNTER — Ambulatory Visit (INDEPENDENT_AMBULATORY_CARE_PROVIDER_SITE_OTHER): Admitting: Family Medicine

## 2024-01-04 VITALS — BP 110/64 | HR 76 | Temp 98.0°F | Wt 172.0 lb

## 2024-01-04 DIAGNOSIS — I471 Supraventricular tachycardia, unspecified: Secondary | ICD-10-CM | POA: Diagnosis not present

## 2024-01-04 DIAGNOSIS — I5042 Chronic combined systolic (congestive) and diastolic (congestive) heart failure: Secondary | ICD-10-CM | POA: Diagnosis not present

## 2024-01-04 DIAGNOSIS — I10 Essential (primary) hypertension: Secondary | ICD-10-CM

## 2024-01-04 NOTE — Telephone Encounter (Signed)
Called patient left a VM to return call

## 2024-01-04 NOTE — Progress Notes (Signed)
   Subjective:    Patient ID: Wendy Mosley, female    DOB: [birth year], 73 y.o.   MRN: [id number]  HPI Here to discuss how she felt when she woke up this morning. Normally her BP has been running in the 140's to 160's systolic with heart rate of 70's to 80's. When she woke up this morning she felt very weak and shaky. No chest pain or SOB, but she flet her heart was racing. When she took her BP it was 86/54 with a HR of 152. This lasted about 5 minutes and then everything settled down. Her BP and HR returned to her baseline. Currently she feels fine. I see her hx includes systolic and diastolic combined heart failure and SVT. No hx of atrial fibrillation.    Review of Systems  Constitutional: Negative.   Respiratory: Negative.    Cardiovascular: Negative.   Neurological: Negative.        Objective:   Physical Exam Constitutional:      General: She is not in acute distress.    Appearance: She is obese.  Cardiovascular:     Rate and Rhythm: Normal rate and regular rhythm.     Pulses: Normal pulses.     Heart sounds: Normal heart sounds.  Pulmonary:     Effort: Pulmonary effort is normal.     Breath sounds: Normal breath sounds.  Musculoskeletal:     Comments: 2+ edema in both ankles  Neurological:     Mental Status: She is alert.           Assessment & Plan:  Her HTN and CHF seem to be stable, but she may have been awakened this morning by a bout of atrial fibrillation. She is fine right now. I asked her to monitor her BP and HR several times a day and to follow up with Dr. Jordan next week. She will go top the ED if she gets another spell like this morning. I personally spent a total of 33 minutes in the care of the patient today including getting/reviewing separately obtained history, performing a medically appropriate exam/evaluation, counseling and educating, and documenting clinical information in the EHR.  Garnette Olmsted, MD  Garnette Olmsted, MD

## 2024-01-05 NOTE — Telephone Encounter (Signed)
 Called patient she states that everything is cleared up

## 2024-01-11 ENCOUNTER — Ambulatory Visit: Payer: Self-pay | Admitting: Family Medicine

## 2024-01-11 ENCOUNTER — Encounter: Payer: Self-pay | Admitting: Family Medicine

## 2024-01-11 ENCOUNTER — Ambulatory Visit (INDEPENDENT_AMBULATORY_CARE_PROVIDER_SITE_OTHER): Admitting: Family Medicine

## 2024-01-11 VITALS — BP 140/80 | HR 80 | Temp 98.6°F | Resp 16 | Ht 63.0 in | Wt 176.4 lb

## 2024-01-11 DIAGNOSIS — R42 Dizziness and giddiness: Secondary | ICD-10-CM | POA: Diagnosis not present

## 2024-01-11 DIAGNOSIS — I471 Supraventricular tachycardia, unspecified: Secondary | ICD-10-CM | POA: Diagnosis not present

## 2024-01-11 DIAGNOSIS — L602 Onychogryphosis: Secondary | ICD-10-CM | POA: Diagnosis not present

## 2024-01-11 DIAGNOSIS — I2510 Atherosclerotic heart disease of native coronary artery without angina pectoris: Secondary | ICD-10-CM

## 2024-01-11 DIAGNOSIS — Z23 Encounter for immunization: Secondary | ICD-10-CM | POA: Diagnosis not present

## 2024-01-11 DIAGNOSIS — I10 Essential (primary) hypertension: Secondary | ICD-10-CM

## 2024-01-11 LAB — BASIC METABOLIC PANEL WITH GFR
BUN: 10 mg/dL (ref 6–23)
CO2: 28 meq/L (ref 19–32)
Calcium: 8.8 mg/dL (ref 8.4–10.5)
Chloride: 104 meq/L (ref 96–112)
Creatinine, Ser: 0.85 mg/dL (ref 0.40–1.20)
GFR: 67.93 mL/min (ref >60.00)
Glucose, Bld: 117 mg/dL — ABNORMAL HIGH (ref 70–99)
Potassium: 3.5 meq/L (ref 3.5–5.1)
Sodium: 140 meq/L (ref 135–145)

## 2024-01-11 LAB — CBC WITH DIFFERENTIAL/PLATELET
Basophils Absolute: 0 K/uL (ref 0.0–0.1)
Basophils Relative: 0.6 % (ref 0.0–3.0)
Eosinophils Absolute: 0.1 K/uL (ref 0.0–0.7)
Eosinophils Relative: 1.6 % (ref 0.0–5.0)
HCT: 37.8 % (ref 36.0–46.0)
Hemoglobin: 11.9 g/dL — ABNORMAL LOW (ref 12.0–15.0)
Lymphocytes Relative: 25.3 % (ref 12.0–46.0)
Lymphs Abs: 1.8 K/uL (ref 0.7–4.0)
MCHC: 31.6 g/dL (ref 30.0–36.0)
MCV: 72.4 fl — ABNORMAL LOW (ref 78.0–100.0)
Monocytes Absolute: 0.5 K/uL (ref 0.1–1.0)
Monocytes Relative: 6.9 % (ref 3.0–12.0)
Neutro Abs: 4.6 K/uL (ref 1.4–7.7)
Neutrophils Relative %: 65.6 % (ref 43.0–77.0)
Platelets: 363 K/uL (ref 150.0–400.0)
RBC: 5.21 Mil/uL — ABNORMAL HIGH (ref 3.87–5.11)
RDW: 16 % — ABNORMAL HIGH (ref 11.5–15.5)
WBC: 7.1 K/uL (ref 4.0–10.5)

## 2024-01-11 MED ORDER — METOPROLOL TARTRATE 25 MG PO TABS: 12.5000 mg | ORAL_TABLET | Freq: Two times a day (BID) | ORAL | 0 refills | Status: DC

## 2024-01-11 NOTE — Assessment & Plan Note (Signed)
 Today BP is elevated. We discussed possible complications of poorly controlled HTN. She agrees with starting Metoprolol  Tartrate 25 mg 1/2 tab bid. Some side effects discussed. Continue low salt diet. Continue monitoring BP but needs to bring numbers next visit. F/U in 6 weeks, before if needed.

## 2024-01-11 NOTE — Patient Instructions (Addendum)
 A few things to remember from today's visit:  Primary hypertension - Plan: metoprolol  tartrate (LOPRESSOR ) 25 MG tablet, Basic metabolic panel with GFR, CBC with Differential/Platelet  Hypertrophic toenail - Plan: Ambulatory referral to Podiatry  Type 2 diabetes mellitus with other specified complication, without long-term current use of insulin  (HCC)  CAD in native artery - Plan: Ambulatory referral to Cardiology  SVT (supraventricular tachycardia) - Plan: Ambulatory referral to Cardiology, metoprolol  tartrate (LOPRESSOR ) 25 MG tablet  Dizziness - Plan: Basic metabolic panel with GFR, CBC with Differential/Platelet  Today Metoprolol  tartrate 25 mg 1/2 tab 2 times daily started. Continue monitoring blood pressure and heart rate. Cardiology and podiatrist referral placed. I see you back in 6 weeks.  If you need refills for medications you take chronically, please call your pharmacy. Do not use My Chart to request refills or for acute issues that need immediate attention. If you send a my chart message, it may take a few days to be addressed, specially if I am not in the office.  Please be sure medication list is accurate. If a new problem present, please set up appointment sooner than planned today.

## 2024-01-11 NOTE — Assessment & Plan Note (Addendum)
 She has not had an episode since OV 01/04/24. Today Metoprolol  Tartrate 25 mg 1/2 tab bid started. Instructed about warning signs. Continue following with cardiologist.

## 2024-01-11 NOTE — Assessment & Plan Note (Addendum)
 She denies any CP or SOB. We discussed indications for sublingual nitroglycerine. Currently she is on Ezetimibe  and Aspirin  81 mg. Metoprolol  tartrate 25 mg 1/2 tab bid added today. New cardiology referral placed.

## 2024-01-11 NOTE — Progress Notes (Signed)
 Chief Complaint  Patient presents with   Medical Management of Chronic Issues   Discussed the use of AI scribe software for clinical note transcription with the patient, who gave verbal consent to proceed.  History of Present Illness Wendy Mosley is a 73 year old female with PMHx of hypertension,coronary artery disease, SVT, OSA, GERD, DM 2, hyperlipidemia, and OA here today to follow-up on recent visits, seen by Dr. Mercer and Dr. Johnny for dizziness and episodes of low blood pressure respectively. I last saw her on 07/05/2023, when she was complaining of dizziness.  At that time she reported having episodes intermittently for about a year.  -She experiences dizziness every other day, described as a sensation of imbalance and sometimes spinning, which subsides with relaxation/rest. No associated chest pain, difficulty breathing, palpitations, nausea, or vomiting are noted during these episodes. She has not identified exacerbating factors.  -She also experiences sharp, intense headaches rated as ten out of ten, primarily in the right temporal region, sometimes bilateral. She suspects a connection to her dental issues, as she requires teeth removal and is considering implants. Pain is sudden and last a few minutes, resolve spontaneously. No associated symptoms. Brain MRI done in 05/2021 done due to dizziness associated with left-sided heaviness. 1. No acute intracranial abnormality. 2. 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 3. Periventricular and subcortical T2 hyperintensities bilaterally are moderately advanced for age. This likely reflects the sequela of chronic microvascular ischemia.  Hypertension: Currently she is not on pharmacologic treatment. She has been monitoring BP at home, she does not recall BP readings but mentions that some SBP's are 140s and 150s.  According to patient, she was seen on 01/04/2024 because she experienced an episode of low blood  pressure at 83/54 mmHg with a heart rate of 152 bpm, lasting about five minutes. She used three nitroglycerin  tablets the day before because she did not feel well but denies any associated chest pain. No similar episodes have occurred since.  Lab Results  Component Value Date   NA 140 07/05/2023   CL 102 07/05/2023   K 4.0 07/05/2023   CO2 31 07/05/2023   BUN 12 07/05/2023   CREATININE 0.95 07/05/2023   GFR 59.66 (L) 07/05/2023   CALCIUM  9.2 07/05/2023   PHOS 4.0 06/16/2021   ALBUMIN 4.0 07/05/2023   GLUCOSE 91 07/05/2023   Lab Results  Component Value Date   WBC 5.5 07/05/2023   HGB 11.9 (L) 07/05/2023   HCT 38.3 07/05/2023   MCV 72.6 (L) 07/05/2023   PLT 377.0 07/05/2023    Coronary artery disease: She takes Aspirin  81 mg daily and ezetimibe  10 mg daily. She has not tolerated statins in the past. Last visit with cardiology on 11/04/22, she would like to establish with a new provider.  DM 2: Diagnosed in 2022. In the past she has been on metformin  and Jardiance . She is on no pharmacologic treatment. She is not checking BS. History of feet numbness/tingling.  Hypertrophic toenails, she would like a referral to podiatrist.  Lab Results  Component Value Date   HGBA1C 6.2 (A) 01/03/2024   Lab Results  Component Value Date   LABMICR 14.1 11/24/2021   MICROALBUR 1.6 07/05/2023   MICROALBUR 80 10/25/2019   Review of Systems  Constitutional:  Negative for activity change, appetite change, chills and fever.  HENT:  Negative for mouth sores and sore throat.   Respiratory:  Negative for cough and wheezing.   Cardiovascular:  Negative for leg swelling.  Gastrointestinal:  Negative for abdominal pain, nausea and vomiting.  Endocrine: Negative for cold intolerance, heat intolerance, polydipsia, polyphagia and polyuria.  Genitourinary:  Negative for decreased urine volume, dysuria and hematuria.  Skin:  Negative for rash.  Neurological:  Negative for syncope, facial asymmetry and  weakness.  Psychiatric/Behavioral:  Negative for confusion and hallucinations.   See other pertinent positives and negatives in HPI.  Current Outpatient Medications on File Prior to Visit  Medication Sig Dispense Refill   aspirin  EC 81 MG tablet Take 1 tablet (81 mg total) by mouth daily. Swallow whole. 90 tablet 3   B COMPLEX-ZINC PO Take 1 tablet by mouth daily.     clobetasol  ointment (TEMOVATE ) 0.05 % Apply 5 mm from tube opening, applied from the distal skin crease of the index finger to the tip at night for 30 days. 30 g 1   diclofenac  Sodium (VOLTAREN ) 1 % GEL Apply 2 g topically 4 (four) times daily. 100 g 3   ezetimibe  (ZETIA ) 10 MG tablet TAKE 1 TABLET BY MOUTH EVERY DAY 90 tablet 3   loratadine  (CLARITIN ) 10 MG tablet Take 10 mg by mouth daily as needed for allergies.     MAGNESIUM PO Take by mouth.     nitroGLYCERIN  (NITROSTAT ) 0.4 MG SL tablet Place 1 tablet (0.4 mg total) under the tongue every 5 (five) minutes x 3 doses as needed for chest pain. 10 tablet 0   pantoprazole  (PROTONIX ) 40 MG tablet TAKE 1 TABLET BY MOUTH EVERY DAY 90 tablet 3   triamcinolone  cream (KENALOG ) 0.1 % Apply 1 Application topically daily as needed (on affected skin.). 30 g 1   No current facility-administered medications on file prior to visit.   Past Medical History:  Diagnosis Date   CAD in native artery 06/06/2020   CAD native.  Calcium  score 176, 85th percentile 04/2020   Cataracts, bilateral    right eye - just watching   Chronic combined systolic and diastolic heart failure (HCC) 06/17/2021   Diabetes mellitus without complication (HCC)    Diverticulitis    GERD (gastroesophageal reflux disease) 2006   Hearing loss    right ear - no hearing aid   Heart attack (HCC) 2008   Hypertension    Irregular heart beat 1973   OSA on CPAP 05/04/2022   Palpitations 11/30/2019   SVD (spontaneous vaginal delivery)    x 3   Allergies  Allergen Reactions   Lipitor [Atorvastatin ] Other (See  Comments)    Hair loss   Sulfonamide Derivatives     Rash/itching     Social History   Socioeconomic History   Marital status: Married    Spouse name: Not on file   Number of children: Not on file   Years of education: Not on file   Highest education level: Not on file  Occupational History   Not on file  Tobacco Use   Smoking status: Former    Current packs/day: 0.00    Average packs/day: 1.5 packs/day for 37.0 years (55.5 ttl pk-yrs)    Types: Cigarettes    Start date: 01/31/1974    Quit date: 02/01/2011    Years since quitting: 12.9   Smokeless tobacco: Never  Vaping Use   Vaping status: Never Used  Substance and Sexual Activity   Alcohol use: Not Currently    Comment: occasional glass of wine   Drug use: No    Comment: No IV drug   Sexual activity: Not  Currently    Birth control/protection: Post-menopausal  Other Topics Concern   Not on file  Social History Narrative   [employer] Oper[employer]   Lives in [place]   3 Grown Children   Legally Separated to Second Husband         Social Drivers of Health   Financial Resource Strain: Low Risk  (07/08/2023)   Overall Financial Resource Strain (CARDIA)    Difficulty of Paying Living Expenses: Not hard at all  Food Insecurity: No Food Insecurity (07/08/2023)   Hunger Vital Sign    Worried About Running Out of Food in the Last Year: Never true    Ran Out of Food in the Last Year: Never true  Transportation Needs: No Transportation Needs (07/08/2023)   PRAPARE - Administrator, Civil Service (Medical): No    Lack of Transportation (Non-Medical): No  Physical Activity: Inactive (07/08/2023)   Exercise Vital Sign    Days of Exercise per Week: 0 days    Minutes of Exercise per Session: 0 min  Stress: No Stress Concern Present (07/08/2023)   [employer] of Occupational Health - Occupational Stress Questionnaire    Feeling of Stress : Not at all  Social Connections: Moderately Integrated (07/08/2023)    Social Connection and Isolation Panel    Frequency of Communication with Friends and Family: Twice a week    Frequency of Social Gatherings with Friends and Family: Once a week    Attends Religious Services: More than 4 times per year    Active Member of Clubs or Organizations: No    Attends Banker Meetings: Never    Marital Status: Married    Vitals:   01/11/24 0839 01/11/24 0841  BP: (!) 140/80 (!) 140/80  Pulse: 80   Resp: 16   Temp: 98.6 F (37 C)   SpO2: 96%    Body mass index is 31.25 kg/m.  Physical Exam Vitals and nursing note reviewed.  Constitutional:      General: She is not in acute distress.    Appearance: She is well-developed.  HENT:     Head: Normocephalic and atraumatic.     Mouth/Throat:     Mouth: Mucous membranes are moist.     Pharynx: Oropharynx is clear. Uvula midline.  Eyes:     Conjunctiva/sclera: Conjunctivae normal.  Cardiovascular:     Rate and Rhythm: Normal rate and regular rhythm.     Pulses:          Dorsalis pedis pulses are 2+ on the right side and 2+ on the left side.     Heart sounds: No murmur heard. Pulmonary:     Effort: Pulmonary effort is normal. No respiratory distress.     Breath sounds: Normal breath sounds.  Abdominal:     Palpations: Abdomen is soft. There is no mass.     Tenderness: There is no abdominal tenderness.  Musculoskeletal:     Right lower leg: No edema.     Left lower leg: No edema.  Lymphadenopathy:     Cervical: No cervical adenopathy.  Skin:    General: Skin is warm.     Findings: No erythema or rash.  Neurological:     General: No focal deficit present.     Mental Status: She is alert and oriented to person, place, and time.     Cranial Nerves: No cranial nerve deficit.     Gait: Gait normal.  Psychiatric:  Mood and Affect: Mood and affect normal.   ASSESSMENT AND PLAN:  Ms. Taylia Berber was seen today for medical management of chronic issues.  Diagnoses and all orders for  this visit: Orders Placed This Encounter  Procedures   Flu vaccine HIGH DOSE PF(Fluzone Trivalent)   Basic metabolic panel with GFR   CBC with Differential/Platelet   Ambulatory referral to Cardiology   Ambulatory referral to Podiatry   Lab Results  Component Value Date   NA 140 01/11/2024   CL 104 01/11/2024   K 3.5 01/11/2024   CO2 28 01/11/2024   BUN 10 01/11/2024   CREATININE 0.85 01/11/2024   GFR 67.93 01/11/2024   CALCIUM  8.8 01/11/2024   PHOS 4.0 06/16/2021   ALBUMIN 4.0 07/05/2023   GLUCOSE 117 (H) 01/11/2024   Lab Results  Component Value Date   WBC 7.1 01/11/2024   HGB 11.9 (L) 01/11/2024   HCT 37.8 01/11/2024   MCV 72.4 (L) 01/11/2024   PLT 363.0 01/11/2024    Primary hypertension Assessment & Plan: Today BP is elevated. We discussed possible complications of poorly controlled HTN. She agrees with starting Metoprolol  Tartrate 25 mg 1/2 tab bid. Some side effects discussed. Continue low salt diet. Continue monitoring BP but needs to bring numbers next visit. F/U in 6 weeks, before if needed.  Orders: -     Metoprolol  Tartrate; Take 0.5 tablets (12.5 mg total) by mouth 2 (two) times daily.  Dispense: 90 tablet; Refill: 0 -     Basic metabolic panel with GFR; Future -     CBC with Differential/Platelet; Future  Hypertrophic toenail Because her health insurance she needs a referral to podiatrist. We discussed the importance of appropriate foot care. No signs of acute infectious process.  -     Ambulatory referral to Podiatry  CAD in native artery Assessment & Plan: She denies any CP or SOB. We discussed indications for sublingual nitroglycerine. Currently she is on Ezetimibe  and Aspirin  81 mg. Metoprolol  tartrate 25 mg 1/2 tab bid added today. New cardiology referral placed.  Orders: -     Ambulatory referral to Cardiology  SVT (supraventricular tachycardia) Assessment & Plan: She has not had an episode since OV 01/04/24. Today Metoprolol   Tartrate 25 mg 1/2 tab bid started. Instructed about warning signs. Continue following with cardiologist.  Orders: -     Ambulatory referral to Cardiology -     Metoprolol  Tartrate; Take 0.5 tablets (12.5 mg total) by mouth 2 (two) times daily.  Dispense: 90 tablet; Refill: 0  Dizziness This seems to be a chronic problem. We discussed possible etiologies. Some of her chronic comorbilities can be contributing factors. Continue adequate hydration. I do not think imaging is needed at this time. Appt with new cardiologist will be arranged. Clearly instructed about warning signs.  -     Basic metabolic panel with GFR; Future -     CBC with Differential/Platelet; Future  Immunization due -     Flu vaccine HIGH DOSE PF(Fluzone Trivalent)   I personally spent a total of 42 minutes in the care of the patient today including preparing to see the patient, getting/reviewing separately obtained history, performing a medically appropriate exam/evaluation, counseling and educating, placing orders, documenting clinical information in the EHR, and communicating results.  Return in about 6 weeks (around 02/22/2024) for chronic problems.  Alishba Naples G. Sarie Stall, MD  [facility]. [place] office.

## 2024-01-13 ENCOUNTER — Ambulatory Visit: Payer: Self-pay | Admitting: Family Medicine

## 2024-01-18 ENCOUNTER — Telehealth: Payer: Self-pay

## 2024-01-18 NOTE — Telephone Encounter (Signed)
 Copied from CRM [id number]. Topic: Clinical - Medication Question >> Jan 18, 2024  3:00 PM Nessti S wrote: Reason for CRM: pt called because she wants to know the correct way to take metoprolol  tartrate (LOPRESSOR ) 25 MG tablet. The instructions on the bottle has her scared to take them. Call back number [phone]

## 2024-01-19 NOTE — Telephone Encounter (Signed)
 Because elevated BP, recommended starting Metoprolol  Tartrate 25 mg 1/2 tab bid.Continue monitoring BP, bring BP readings next visit. F/U in 6 weeks, before if needed. Thanks, BJ

## 2024-01-19 NOTE — Telephone Encounter (Signed)
 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 Okay for e2c2 to relay.

## 2024-01-20 NOTE — Telephone Encounter (Signed)
 Spoke with patient. Advised patient of Dr. Gib response. Questions answered. Patient voiced understanding.

## 2024-01-21 ENCOUNTER — Encounter: Payer: Self-pay | Admitting: Podiatry

## 2024-01-21 ENCOUNTER — Ambulatory Visit: Admitting: Podiatry

## 2024-01-21 DIAGNOSIS — M79671 Pain in right foot: Secondary | ICD-10-CM | POA: Diagnosis not present

## 2024-01-21 DIAGNOSIS — L600 Ingrowing nail: Secondary | ICD-10-CM

## 2024-01-21 DIAGNOSIS — M79672 Pain in left foot: Secondary | ICD-10-CM | POA: Diagnosis not present

## 2024-01-21 NOTE — Progress Notes (Signed)
 Patient complains of painful ingrown hallux bilaterally.  Tenderness on the nail border second toe left.  No bother for many years.  Painful when wearing shoes or walking.  Patient denies fevers, chills, nausea, vomiting.  Objective:  Vitals: Reviewed  General: Well developed, nourished, in no acute distress, alert and oriented x3   Vascular: DP pulse 2/4 bilateral. PT pulse 2/4 bilateral.  Mild edema toe with ingrown nail.  Capillary refill time immediate bilaterally  Dermatology: Erythema, edema, incurvated nail border hallux borders bilaterally with no drainage . Tenderness present with palpation. Normal skin tone and texture feet with normal hair growth.  Redness along nail folds.  Very tender.  Neurological: Grossly intact. Normal reflexes.   Musculoskeletal: Tenderness with palpation of the distal hallux bilaterally. No tenderness or painful ROM at IPJ.  Diagnosis: 1.  Pain feet bilaterally 2.  Ingrown nail hallux both borders bilaterally  Plan: -New patient office visit for evaluation and management level 3.  Modifier 25 -discussed etiology and treatment of ingrown nails. Discussed surgical vs conservative treatment.  Will do the hallux left today and the right in 2 weeks. -Consent signed for appropriate matrixectomy affected nail(s).   Procedure(s):   - Matrixectomy(s) hallux nail left: Toe anesthetized with 3cc 2:1 mixture 2% Lidocaine  with epinephrine: Sodium Bicarbonate. Surgical site prepped. Digital tourniquet applied.  Avulsion of nail plate. performed. Matrixecomy performed with three 30 second applications of phenol to nail matrix. Site irrigated with alcohol.  Tourniquet released with good vascularity noticed in digit.  Applied triple antibiotic to nailbed and applied gauze and Coban dressing. - Written and oral postoperative instructions given.  -Return for post-op 2 weeks and possible matrixectomy hallux nail right  J Prentice Binder, DPM

## 2024-01-21 NOTE — Patient Instructions (Addendum)

## 2024-01-24 ENCOUNTER — Ambulatory Visit: Admitting: Family Medicine

## 2024-02-04 ENCOUNTER — Encounter: Payer: Self-pay | Admitting: Podiatry

## 2024-02-04 ENCOUNTER — Ambulatory Visit: Admitting: Podiatry

## 2024-02-04 DIAGNOSIS — L600 Ingrowing nail: Secondary | ICD-10-CM

## 2024-02-04 NOTE — Progress Notes (Signed)
 Patient presents follow-up nail surgery 1 LT.  No complaints.  Physical exam:  Dermatologic: Nail surgery site for matrixectomy healing well with no signs of infection.  Diagnosis: 1.  Status post matrixectomy 1 left.  Healing well  Plan: - POV status post nail surgery , if patient has any problems she can call for appointment  -Discussed the matricectomy of the hallux nail right should like to wait until a 12/19 to do this.  -Return 12/19 for matrixectomy hallux nail right

## 2024-02-07 IMAGING — CT CT HEAD W/O CM
4 series · 16 of 47 positions shown, 18 images · non-contrast
Comparison: No priors.

CLINICAL DATA: 70-year-old female with history of acute onset of
neurologic deficit. Suspected stroke.



[Series 2: head wo · axial · 0.39mm/px · z∈[+220,+325]mm · 7 of 29 slices shown, 9 images]
[im 4/29  brain]
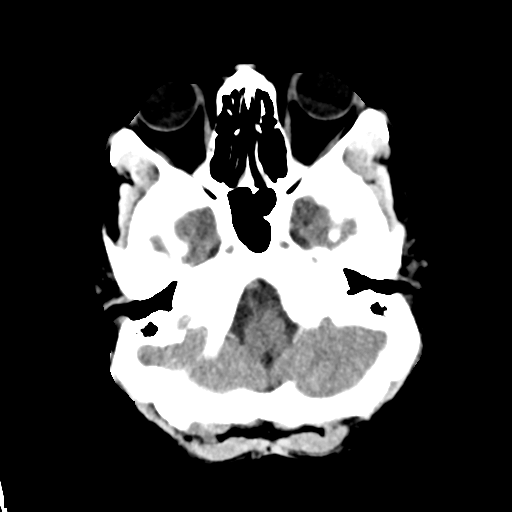
[im 4/29  bone]
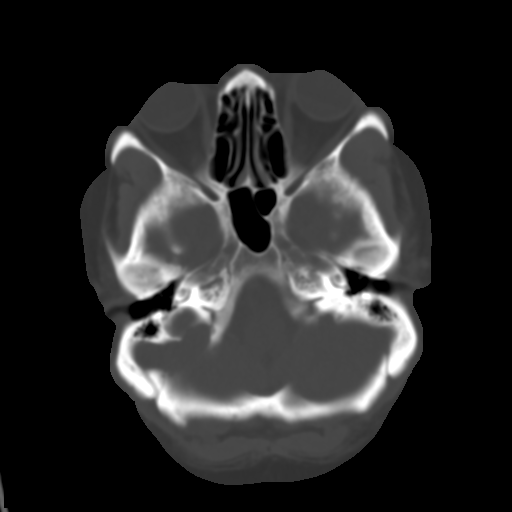
[im 8/29  brain]
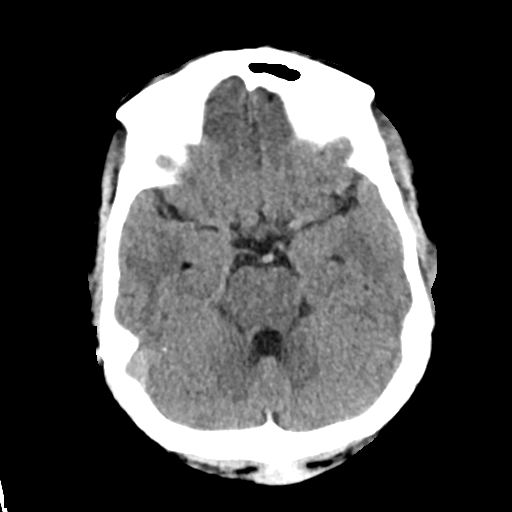
[im 11/29  brain]
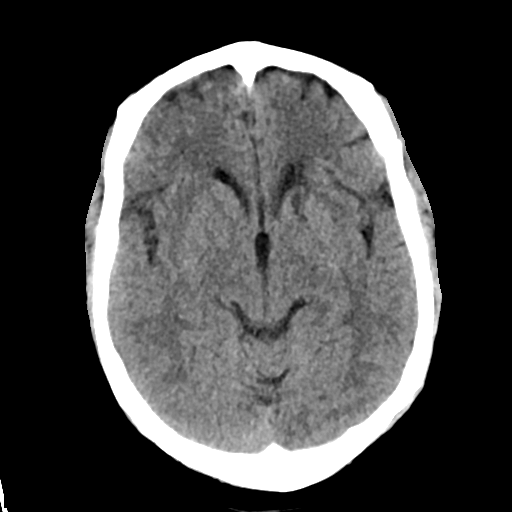
[im 15/29  brain]
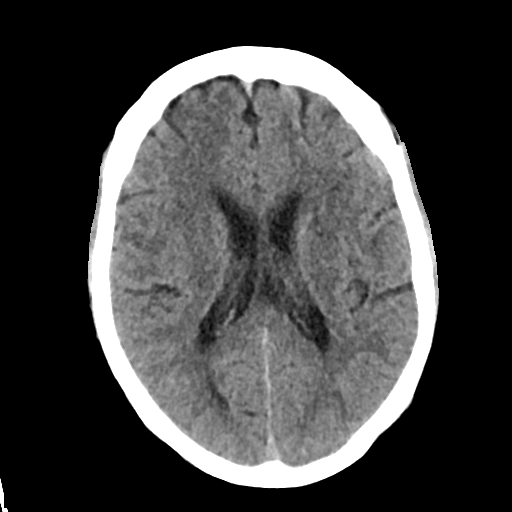
[im 18/29  brain]
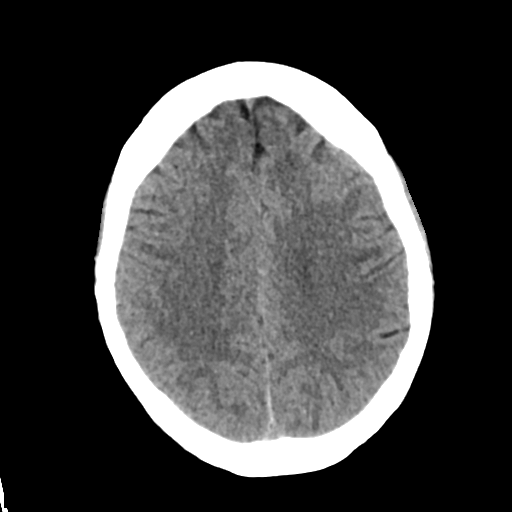
[im 18/29  bone]
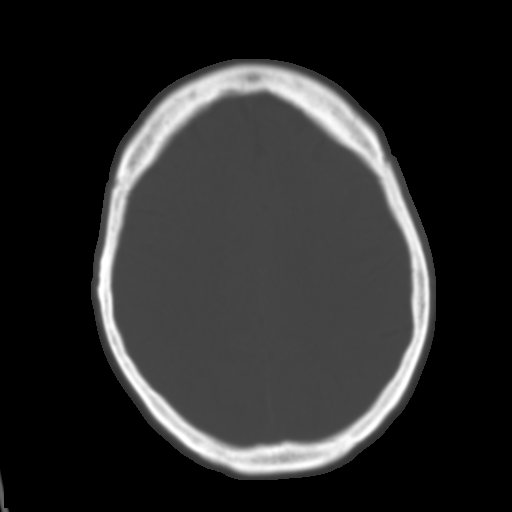
[im 22/29  brain]
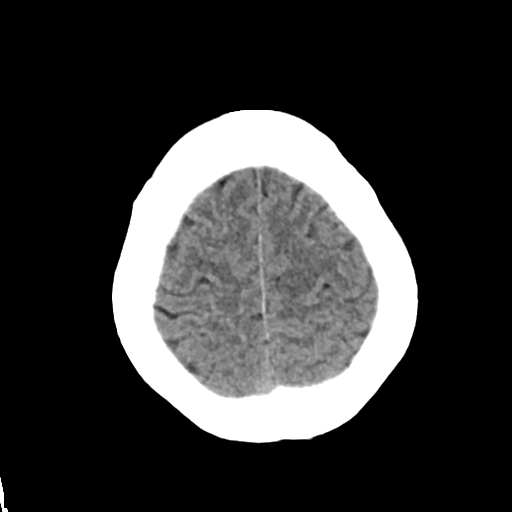
[im 25/29  brain]
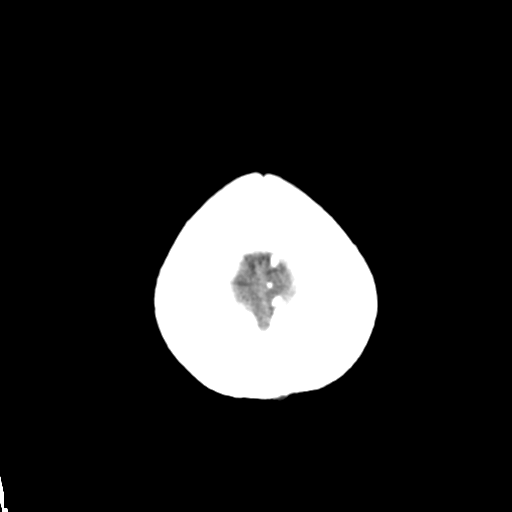

[Series 3: head bone · axial · 0.39mm/px · z∈[+219,+247]mm · 3 of 72 slices shown]
[im 8/72  bone]
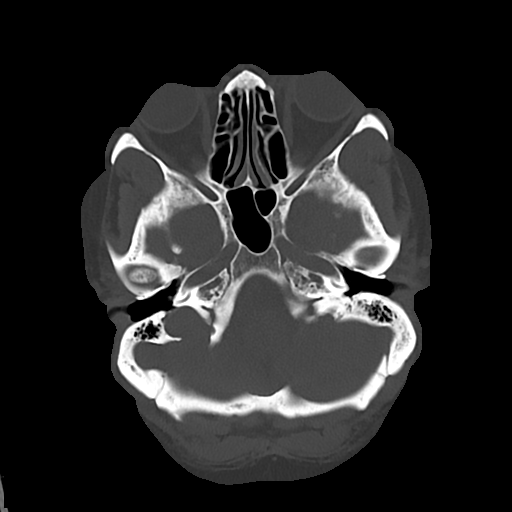
[im 15/72  bone]
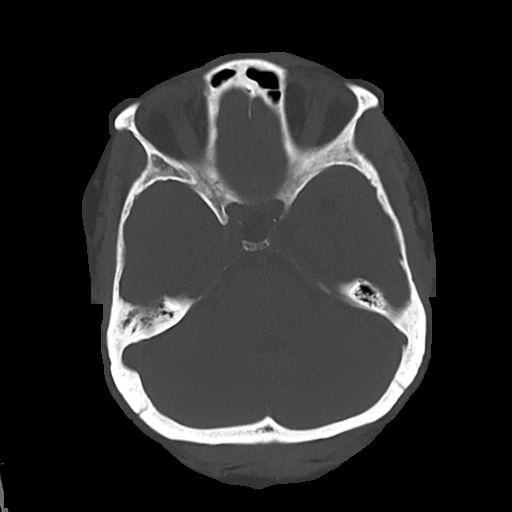
[im 22/72  bone]
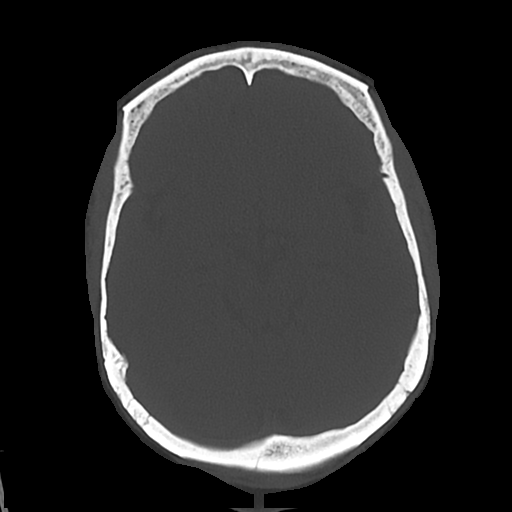

[Series 4: cor soft · coronal · 0.28mm/px · 3 of 63 slices shown]
[im 21/63  brain]
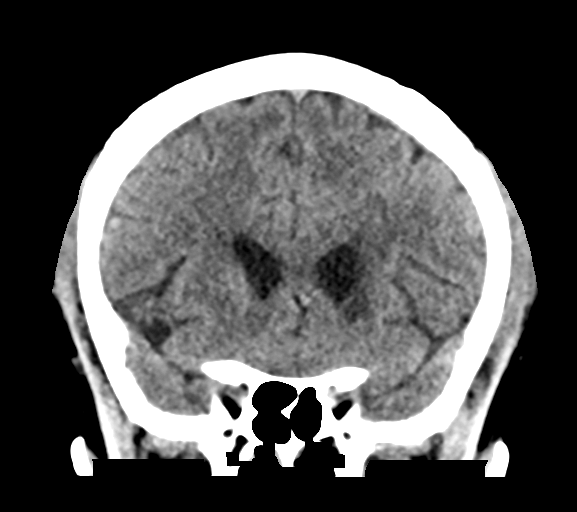
[im 28/63  brain]
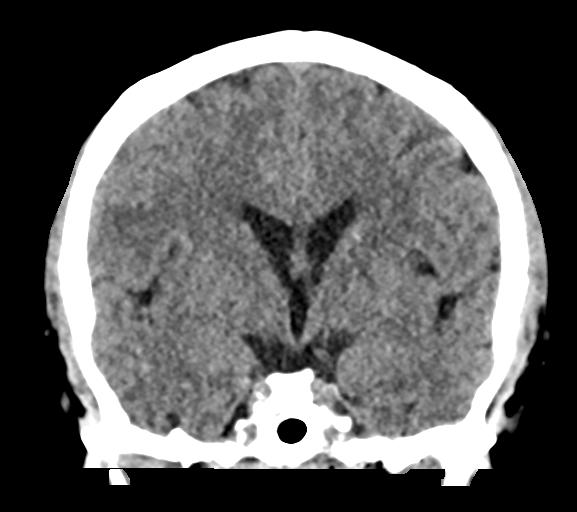
[im 35/63  brain]
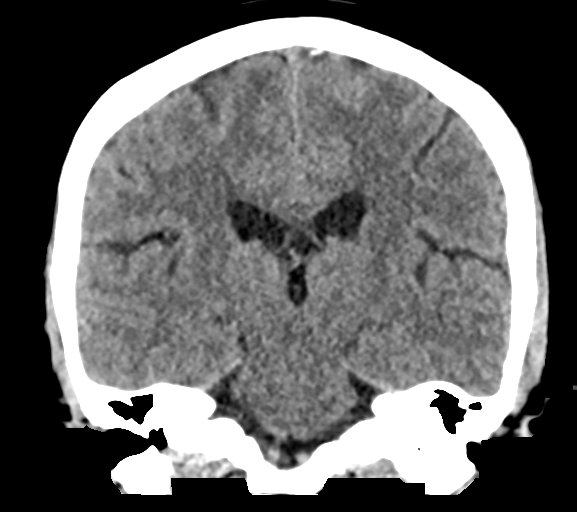

[Series 5: sag soft · sagittal · 0.28mm/px · 3 of 54 slices shown]
[im 18/54  brain]
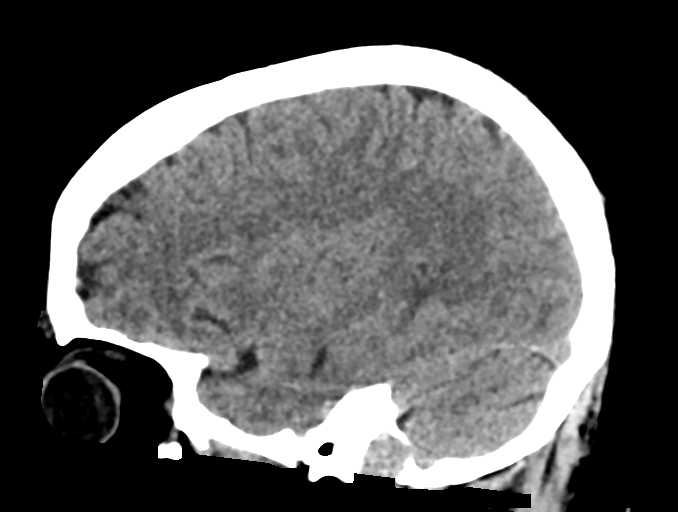
[im 27/54  brain]
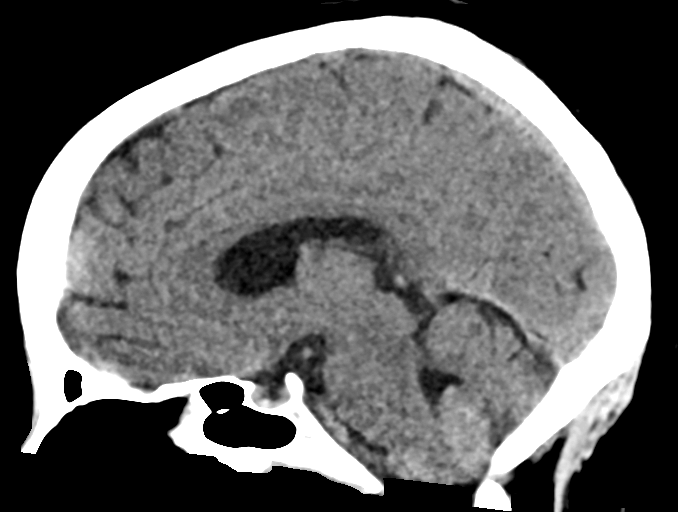
[im 36/54  brain]
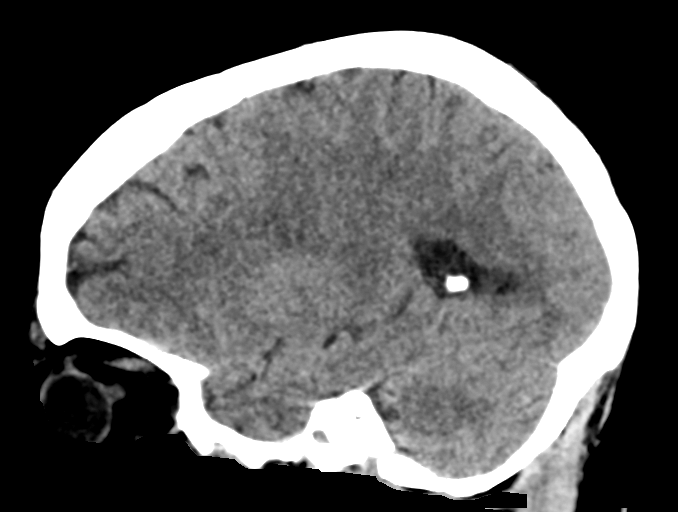

[16 of 47 positions shown; findings below may reference images not displayed]

FINDINGS: Brain: Patchy and confluent areas of decreased attenuation are noted
throughout the deep and periventricular white matter of the cerebral
hemispheres bilaterally, compatible with chronic microvascular
ischemic disease. Well-defined area of low attenuation in the head
of the left caudate nucleus and anterior limb of the left internal
capsule, compatible with an old lacunar infarct. No evidence of
acute infarction, hemorrhage, hydrocephalus, extra-axial collection
or mass lesion/mass effect.

Vascular: No hyperdense vessel or unexpected calcification.

Skull: Normal. Negative for fracture or focal lesion.

Sinuses/Orbits: No acute finding.

Other: None.
IMPRESSION: 1. No acute intracranial abnormalities.
2. Mild chronic microvascular ischemic changes in the cerebral white
matter, and old left basal ganglia lacunar infarct, as above.

## 2024-02-08 IMAGING — CT CT HEART MORP W/ CTA COR W/ SCORE W/ CA W/CM &/OR W/O CM
3 of 8 series · 6 of 20 positions shown, 7 images · IV contrast (APPLIED)
Comparison: Coronary calcium score 04/18/2020
COMPARISON: Coronary calcium score 04/18/2020

Addendum:
EXAM:
OVER-READ INTERPRETATION  CT CHEST

The following report is an over-read performed by radiologist Dr.
Asloma Gordab [[email or web address]] on 06/16/2021. This over-read
does not include interpretation of cardiac or coronary anatomy or
pathology. The coronary calcium score/coronary CTA interpretation by
the cardiologist is attached.
HISTORY: Chest pain
Cardiac/Coronary CT
TECHNIQUE: The patient was scanned on a Siemens Force scanner.
PROTOCOL: A 120 kV prospective scan was triggered in the descending thoracic
aorta at 111 HU's. Axial non-contrast 3 mm slices were carried out
through the heart. The data set was analyzed on a dedicated work
station and scored using the Agatston method. Gantry rotation speed
was 250 msecs and collimation was .6 mm. Heart rate was optimized
medically and sl NTG was given. The 3D data set was reconstructed in
5% intervals of the 35-75 % of the R-R cycle. Systolic and diastolic
phases were analyzed on a dedicated work station using MPR, MIP and
VRT modes. The patient received 100mL OMNIPAQUE IOHEXOL 350 MG/ML
SOLN of contrast.

[Series 10: best syst · axial · 0.39mm/px · z∈[+1338,+1380]mm · 2 of 316 slices shown, 3 images]
[im 106/316  vessel]
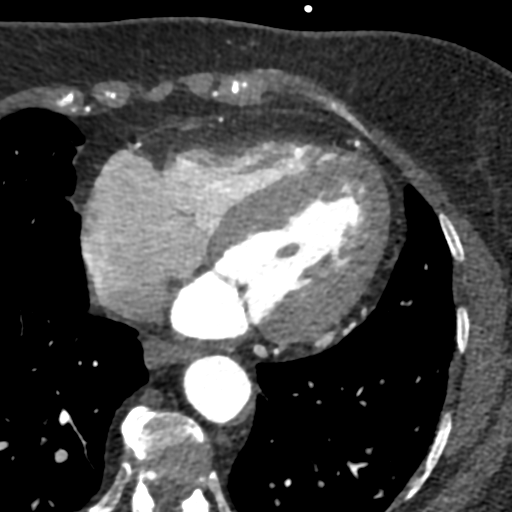
[im 106/316  lung]
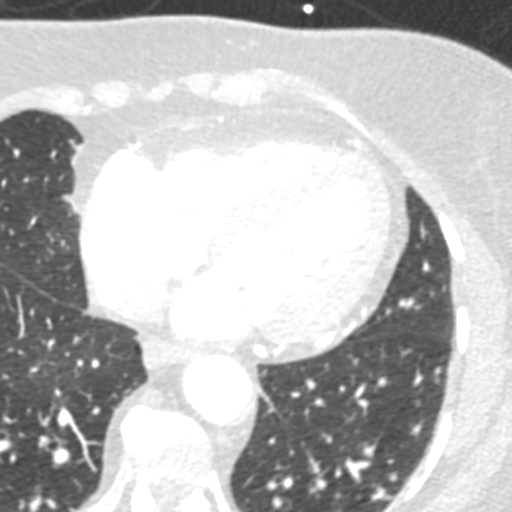
[im 211/316  vessel]
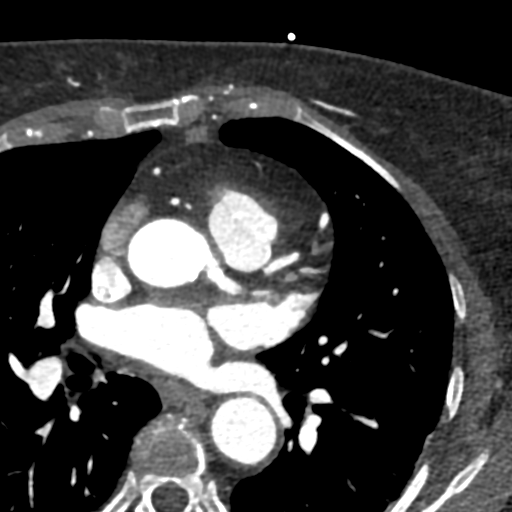

[Series 11: ts diast sharp · axial · 0.39mm/px · z∈[+1338,+1380]mm · 2 of 316 slices shown]
[im 106/316  lung]
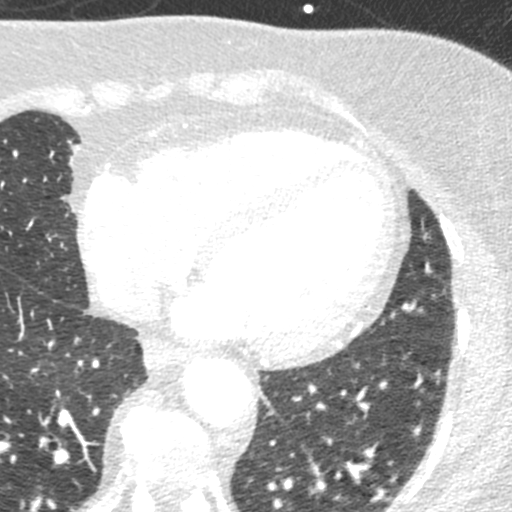
[im 211/316  lung]
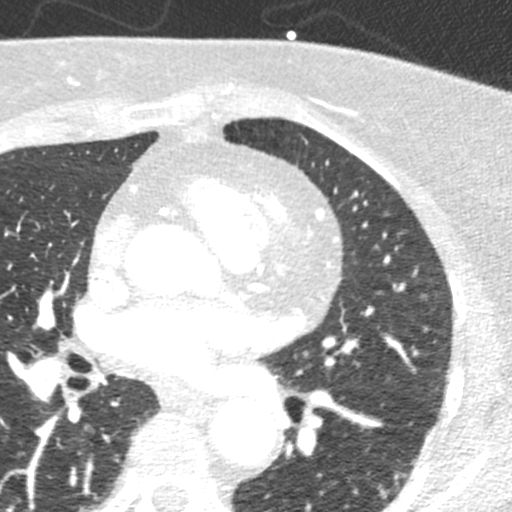

[Series 12: best diast · axial · 0.39mm/px · z∈[+1338,+1380]mm · 2 of 316 slices shown]
[im 106/316  vessel]
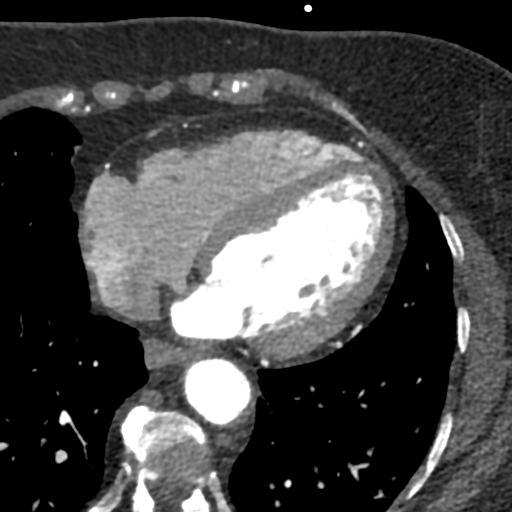
[im 211/316  vessel]
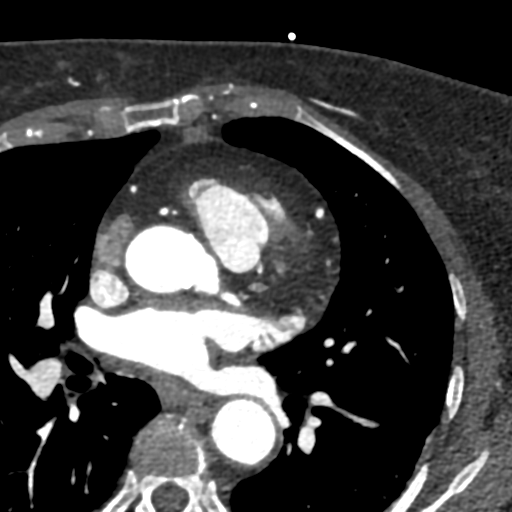

[6 of 20 positions shown; findings below may reference images not displayed]

FINDINGS: Vascular: Mural thrombus and atherosclerotic disease in the distal
abdominal aorta near the hiatus.

Mediastinum/Nodes: Small mediastinal lymph node. Right hilar lymph
node measures 0.9 cm in the short axis. Overall, no significant
lymph node enlargement in the visualized mediastinum.

Lungs/Pleura: Scattered areas of lucency along the periphery of the
lungs and findings most compatible with paraseptal emphysema. No
large pleural effusion. Focal densities along the posterior right
lung base are most compatible with atelectasis or scarring. Again
noted are small calcified granulomas along the medial aspect of the
upper lungs bilaterally. Stable punctate nodule in the periphery of
the right lung on sequence 13 image 15. Additional peripheral
nodules in the right upper lobe on sequence 13, image 5 and 4. The
small peripheral nodules measure approximately 0.2 cm. Not clear if
all of these small peripheral nodules are stable.

Upper Abdomen: Images of the upper abdomen are unremarkable.

Musculoskeletal: No acute bone abnormality.
IMPRESSION: 1. No acute extracardiac findings.
2. Several tiny nodules in both lungs. Many of the nodules are
stable and calcified. A few of the peripheral nodules in the right
lung are age indeterminate. No follow-up needed if patient is
low-risk (and has no known or suspected primary neoplasm).
Non-contrast chest CT can be considered in 12 months if patient is
high-risk. This recommendation follows the consensus statement:
Guidelines for Management of Incidental Pulmonary Nodules Detected
3. Aortic Atherosclerosis (6QP6M-SHL.L) and Emphysema (6QP6M-VL7.U).
FINDINGS: Coronary calcium score: The patient's coronary artery calcium score
is 238, which places the patient in the 87 percentile.

Coronary arteries: Normal coronary origins.  Left dominance.

Right Coronary Artery: Small caliber vessel. Trivial mixed calcified
and noncalcified plaque with 1-24% stenosis maximum.

Left Main Coronary Artery: Normal caliber vessel. Mixed calcified
and noncalcified plaque with 1-24% stenosis.

Left Anterior Descending Coronary Artery: Normal caliber vessel.
Mixed calcified and noncalcified plaque in proximal LAD and portions
of mid LAD. Maximum 1-24% stenosis. Gives rise to one large diagonal
branch.

Left Circumflex Artery: Normal caliber vessel, gives rise to PDA.
Mixed calcified and noncalcified plaque in proximal portion, with a
focal calcified plaque at the origin of OM with 25-49% stenosis.
Gives rise to one large OM branch.

Aorta: Normal size, 28 mm at the mid ascending aorta (level of the
PA bifurcation) measured double oblique. Aortic atherosclerosis,
with likely mural thrombus in abdominal aorta. No dissection seen in
visualized portions of the aorta.

Aortic Valve: No calcifications. Trileaflet.

Other findings:

Normal pulmonary vein drainage into the left atrium.

Normal left atrial appendage without a thrombus.

Normal size of the pulmonary artery.

Normal appearance of the pericardium.
IMPRESSION: 1. Minimal nonobstructive CAD, CADRADS = 2.

2. Coronary calcium score of 238. This was 87th percentile for age
and sex matched control.

3. Normal coronary origin with left dominance.

4. Aortic atherosclerosis, with likely mural thrombus in abdominal
aorta

INTERPRETATION:

1. CAD-RADS 0: No evidence of CAD (0%). Consider non-atherosclerotic
causes of chest pain.

2. CAD-RADS 1: Minimal non-obstructive CAD (0-24%). Consider
non-atherosclerotic causes of chest pain. Consider preventive
therapy and risk factor modification.

3. CAD-RADS 2: Mild non-obstructive CAD (25-49%). Consider
non-atherosclerotic causes of chest pain. Consider preventive
therapy and risk factor modification.

4. CAD-RADS 3: Moderate stenosis (50-69%). Consider symptom-guided
anti-ischemic pharmacotherapy as well as risk factor modification
per guideline directed care. Additional analysis with CT FFR will be
submitted.

5. CAD-RADS 4: Severe stenosis. (70-99% or > 50% left main). Cardiac
catheterization or CT FFR is recommended. Consider symptom-guided
anti-ischemic pharmacotherapy as well as risk factor modification
per guideline directed care. Invasive coronary angiography
recommended with revascularization per published guideline
statements.

6. CAD-RADS 5: Total coronary occlusion (100%). Consider cardiac
catheterization or viability assessment. Consider symptom-guided
anti-ischemic pharmacotherapy as well as risk factor modification
per guideline directed care.

7. CAD-RADS N: Non-diagnostic study. Obstructive CAD can't be
excluded. Alternative evaluation is recommended.

*** End of Addendum ***
EXAM:
OVER-READ INTERPRETATION  CT CHEST

The following report is an over-read performed by radiologist Dr.
Asloma Gordab [[email or web address]] on 06/16/2021. This over-read
does not include interpretation of cardiac or coronary anatomy or
pathology. The coronary calcium score/coronary CTA interpretation by
the cardiologist is attached.
FINDINGS: Vascular: Mural thrombus and atherosclerotic disease in the distal
abdominal aorta near the hiatus.

Mediastinum/Nodes: Small mediastinal lymph node. Right hilar lymph
node measures 0.9 cm in the short axis. Overall, no significant
lymph node enlargement in the visualized mediastinum.

Lungs/Pleura: Scattered areas of lucency along the periphery of the
lungs and findings most compatible with paraseptal emphysema. No
large pleural effusion. Focal densities along the posterior right
lung base are most compatible with atelectasis or scarring. Again
noted are small calcified granulomas along the medial aspect of the
upper lungs bilaterally. Stable punctate nodule in the periphery of
the right lung on sequence 13 image 15. Additional peripheral
nodules in the right upper lobe on sequence 13, image 5 and 4. The
small peripheral nodules measure approximately 0.2 cm. Not clear if
all of these small peripheral nodules are stable.

Upper Abdomen: Images of the upper abdomen are unremarkable.

Musculoskeletal: No acute bone abnormality.
IMPRESSION: 1. No acute extracardiac findings.
2. Several tiny nodules in both lungs. Many of the nodules are
stable and calcified. A few of the peripheral nodules in the right
lung are age indeterminate. No follow-up needed if patient is
low-risk (and has no known or suspected primary neoplasm).
Non-contrast chest CT can be considered in 12 months if patient is
high-risk. This recommendation follows the consensus statement:
Guidelines for Management of Incidental Pulmonary Nodules Detected
3. Aortic Atherosclerosis (6QP6M-SHL.L) and Emphysema (6QP6M-VL7.U).

## 2024-02-18 ENCOUNTER — Ambulatory Visit: Admitting: Podiatry

## 2024-02-18 DIAGNOSIS — L600 Ingrowing nail: Secondary | ICD-10-CM

## 2024-02-18 NOTE — Progress Notes (Signed)
 Patient complains of painful ingrown hallux right.  Nail surgery on the left healed well.  She is getting a little bit of clear drainage but otherwise no complaints.  Patient denies fevers, chills, nausea, vomiting.  Objective:  Vitals: Reviewed  General: Well developed, nourished, in no acute distress, alert and oriented x3   Vascular: DP pulse 2/4 bilateral. PT pulse 2/4 bilateral.  Minimal edema toe with ingrown nail.  Capillary refill time immediate bilaterally  Dermatology: Erythema, edema, incurvated nail border both borders hallux nail right with no drainage . Tenderness present with palpation. Normal skin tone and texture feet with normal hair growth.  Nailbed on hallux left almost healed.  Nail right is extremely thickened and dystrophic.  Neurological: Grossly intact. Normal reflexes.   Musculoskeletal: Tenderness with palpation of the distal hallux right. No tenderness or painful ROM at IPJ.  Diagnosis: Ingrown nail both borders hallux nail right  Plan: -discussed etiology and treatment of ingrown nails. Discussed surgical vs conservative treatment. -Consent signed for appropriate matrixectomy affected nail(s).   Procedure(s):   - Matrixectomy(s) total nail hallux right: Toe anesthetized with 3cc 2:1 mixture 2% Lidocaine  with epinephrine: Sodium Bicarbonate. Surgical site prepped. Digital tourniquet applied.  Avulsion of nail plate. performed. Matrixecomy performed with three 30 second applications of phenol to nail matrix. Site irrigated with alcohol.  Tourniquet released with good vascularity noticed in digit.  Applied triple antibiotic to nailbed and applied gauze and Coban dressing. - Written and oral postoperative instructions given.  -Return for post-op 2 weeks.  JINNY Prentice Binder, DPM

## 2024-02-18 NOTE — Patient Instructions (Signed)

## 2024-02-23 ENCOUNTER — Ambulatory Visit: Admitting: Family Medicine

## 2024-03-03 ENCOUNTER — Ambulatory Visit: Admitting: Podiatry

## 2024-03-07 ENCOUNTER — Ambulatory Visit (INDEPENDENT_AMBULATORY_CARE_PROVIDER_SITE_OTHER): Admitting: Family Medicine

## 2024-03-07 ENCOUNTER — Encounter: Payer: Self-pay | Admitting: Family Medicine

## 2024-03-07 VITALS — BP 126/70 | HR 71 | Temp 98.2°F | Resp 16 | Ht 63.0 in | Wt 170.8 lb

## 2024-03-07 DIAGNOSIS — E785 Hyperlipidemia, unspecified: Secondary | ICD-10-CM

## 2024-03-07 DIAGNOSIS — Z122 Encounter for screening for malignant neoplasm of respiratory organs: Secondary | ICD-10-CM

## 2024-03-07 DIAGNOSIS — I10 Essential (primary) hypertension: Secondary | ICD-10-CM

## 2024-03-07 DIAGNOSIS — I5042 Chronic combined systolic (congestive) and diastolic (congestive) heart failure: Secondary | ICD-10-CM

## 2024-03-07 DIAGNOSIS — I110 Hypertensive heart disease with heart failure: Secondary | ICD-10-CM | POA: Diagnosis not present

## 2024-03-07 DIAGNOSIS — R42 Dizziness and giddiness: Secondary | ICD-10-CM | POA: Diagnosis not present

## 2024-03-07 DIAGNOSIS — H9191 Unspecified hearing loss, right ear: Secondary | ICD-10-CM | POA: Diagnosis not present

## 2024-03-07 DIAGNOSIS — Z1239 Encounter for other screening for malignant neoplasm of breast: Secondary | ICD-10-CM

## 2024-03-07 DIAGNOSIS — E1169 Type 2 diabetes mellitus with other specified complication: Secondary | ICD-10-CM | POA: Diagnosis not present

## 2024-03-07 MED ORDER — EZETIMIBE 10 MG PO TABS: 10.0000 mg | ORAL_TABLET | Freq: Every day | ORAL | 3 refills | Status: AC

## 2024-03-07 NOTE — Progress Notes (Signed)
 "  Chief Complaint  Patient presents with   Medical Management of Chronic Issues   Discussed the use of AI scribe software for clinical note transcription with the patient, who gave verbal consent to proceed.  History of Present Illness Wendy Mosley is a 74 year old female PMHx of hypertension,coronary artery disease, SVT, OSA, GERD, DM 2, hyperlipidemia, and OA here today for follow up. Last seen on 01/11/24.  She experiences episodes of dizziness, which she has reported for a few months. She describes as a sensation similar to walking on a moving school bus.  The dizziness has worsened since starting metoprolol , prescribed last visit for mildly elevated blood pressure. It is not spinning sensation and no associated symptoms. She would like her ears checked for cerumen accumulation.  No earache, but she reports occasional ear issues, having some discomfort and decreased hearing right ear. No recent URI or travel. She has not used OTC treatments.  She has a history of smoking, having quit in 2012. She has been anemic intermittently throughout her life, with her last colonoscopy in 2021 showing a benign polyp.   Since her last visit she has seen podiatrist and he has had both toenails removed.  CAD,SVT, and HFrEF, she has seen cardiologist, requested a different provider. She has not received information about appt.  Negative for unusual or severe headache, visual changes, exertional chest pain, dyspnea,orthopnea,PND,  focal weakness, or edema.  Lab Results  Component Value Date   CREATININE 0.85 01/11/2024   BUN 10 01/11/2024   NA 140 01/11/2024   K 3.5 01/11/2024   CL 104 01/11/2024   CO2 28 01/11/2024   Hyperlipidemia:She has not tolerated statins. She is on Zetia  10 mg daily.  Lab Results  Component Value Date   CHOL 190 11/24/2021   HDL 41 11/24/2021   LDLCALC 124 (H) 11/24/2021   LDLDIRECT 138 (H) 07/02/2011   TRIG 137 11/24/2021   CHOLHDL 4.6 (H) 11/24/2021    Review of Systems  Constitutional:  Negative for activity change, appetite change, chills and fever.  HENT:  Negative for sore throat.   Respiratory:  Negative for cough and wheezing.   Gastrointestinal:  Negative for abdominal pain, nausea and vomiting.  Endocrine: Negative for cold intolerance and heat intolerance.  Genitourinary:  Negative for decreased urine volume, dysuria and hematuria.  Neurological:  Negative for syncope and facial asymmetry.  See other pertinent positives and negatives in HPI.  Medications Ordered Prior to Encounter[1]  Past Medical History:  Diagnosis Date   CAD in native artery 06/06/2020   CAD native.  Calcium  score 176, 85th percentile 04/2020   Cataracts, bilateral    right eye - just watching   Chronic combined systolic and diastolic heart failure (HCC) 06/17/2021   Diabetes mellitus without complication (HCC)    Diverticulitis    GERD (gastroesophageal reflux disease) 2006   Hearing loss    right ear - no hearing aid   Heart attack (HCC) 2008   Hypertension    Irregular heart beat 1973   OSA on CPAP 05/04/2022   Palpitations 11/30/2019   SVD (spontaneous vaginal delivery)    x 3    Allergies[2]  Social History   Socioeconomic History   Marital status: Married    Spouse name: Not on file   Number of children: Not on file   Years of education: Not on file   Highest education level: GED or equivalent  Occupational History   Not on file  Tobacco  Use   Smoking status: Former    Current packs/day: 0.00    Average packs/day: 1.5 packs/day for 37.0 years (55.5 ttl pk-yrs)    Types: Cigarettes    Start date: 01/31/1974    Quit date: 02/01/2011    Years since quitting: 13.1   Smokeless tobacco: Never  Vaping Use   Vaping status: Never Used  Substance and Sexual Activity   Alcohol use: Not Currently    Comment: occasional glass of wine   Drug use: No    Comment: No IV drug   Sexual activity: Not Currently    Birth  control/protection: Post-menopausal  Other Topics Concern   Not on file  Social History Narrative   [employer] Operational Officer   Lives in [place]   3 Grown Children   Legally Separated to Second Husband         Social Drivers of Health   Tobacco Use: Medium Risk (03/07/2024)   Patient History    Smoking Tobacco Use: Former    Smokeless Tobacco Use: Never    Passive Exposure: Not on file  Financial Resource Strain: Medium Risk (01/23/2024)   Overall Financial Resource Strain (CARDIA)    Difficulty of Paying Living Expenses: Somewhat hard  Food Insecurity: No Food Insecurity (01/23/2024)   Epic    Worried About Radiation Protection Practitioner of Food in the Last Year: Never true    Ran Out of Food in the Last Year: Never true  Transportation Needs: No Transportation Needs (01/23/2024)   Epic    Lack of Transportation (Medical): No    Lack of Transportation (Non-Medical): No  Physical Activity: Insufficiently Active (01/23/2024)   Exercise Vital Sign    Days of Exercise per Week: 1 day    Minutes of Exercise per Session: 10 min  Stress: No Stress Concern Present (01/23/2024)   [employer] of Occupational Health - Occupational Stress Questionnaire    Feeling of Stress: Not at all  Social Connections: Moderately Integrated (01/23/2024)   Social Connection and Isolation Panel    Frequency of Communication with Friends and Family: Twice a week    Frequency of Social Gatherings with Friends and Family: Once a week    Attends Religious Services: More than 4 times per year    Active Member of Clubs or Organizations: No    Attends Banker Meetings: Not on file    Marital Status: Married  Depression (PHQ2-9): Low Risk (01/03/2024)   Depression (PHQ2-9)    PHQ-2 Score: 0  Alcohol Screen: Low Risk (07/08/2023)   Alcohol Screen    Last Alcohol Screening Score (AUDIT): 0  Housing: Low Risk (01/23/2024)   Epic    Unable to Pay for Housing in the Last Year: No    Number of Times Moved  in the Last Year: 0    Homeless in the Last Year: No  Utilities: Not At Risk (07/08/2023)   [employer] Utilities    Threatened with loss of utilities: No  Health Literacy: Adequate Health Literacy (07/08/2023)   B1300 Health Literacy    Frequency of need for help with medical instructions: Never    Today's Vitals   03/07/24 1036  BP: 126/70  Pulse: 71  Resp: 16  Temp: 98.2 F (36.8 C)  SpO2: 95%  Weight: 170 lb 12.8 oz (77.5 kg)  Height: 5' 3 (1.6 m)   Body mass index is 30.26 kg/m.  Physical Exam Vitals and nursing note reviewed.  Constitutional:      General: She  is not in acute distress.    Appearance: She is well-developed.  HENT:     Head: Normocephalic and atraumatic.     Right Ear: External ear normal.     Left Ear: Tympanic membrane, ear canal and external ear normal.     Ears:     Comments: Cerumen excess right ear canal, TM seen partially.    Mouth/Throat:     Mouth: Mucous membranes are moist.     Pharynx: Oropharynx is clear. Uvula midline.  Eyes:     Conjunctiva/sclera: Conjunctivae normal.  Cardiovascular:     Rate and Rhythm: Normal rate and regular rhythm.     Pulses:          Dorsalis pedis pulses are 2+ on the right side and 2+ on the left side.     Heart sounds: No murmur heard. Pulmonary:     Effort: Pulmonary effort is normal. No respiratory distress.     Breath sounds: Normal breath sounds.  Abdominal:     Palpations: Abdomen is soft. There is no mass.     Tenderness: There is no abdominal tenderness.  Musculoskeletal:     Right lower leg: No edema.     Left lower leg: No edema.  Lymphadenopathy:     Cervical: No cervical adenopathy.  Skin:    General: Skin is warm.     Findings: No erythema or rash.  Neurological:     General: No focal deficit present.     Mental Status: She is alert and oriented to person, place, and time.     Cranial Nerves: No cranial nerve deficit.     Gait: Gait normal.  Psychiatric:        Mood and Affect: Mood and  affect normal.   ASSESSMENT AND PLAN:  Wendy Mosley was seen today for medical management of chronic issues.  Diagnoses and all orders for this visit: Orders Placed This Encounter  Procedures   CT CHEST LUNG CA SCREEN LOW DOSE W/O CM   MM 3D SCREENING MAMMOGRAM BILATERAL BREAST   Hearing loss of right ear, unspecified hearing loss type Cerumen is not impacted. Hearing screening normal. Recommend OTC Debrox and continue avoiding Qtips.  Hearing Screening   500Hz  1000Hz  2000Hz  4000Hz   Right ear Pass Pass Pass Pass  Left ear Pass Pass Pass Pass   Primary hypertension Assessment & Plan: BP has improved but Metoprolol  Tartrate 25 mg 1/2 tab bid has caused dizziness, so discontinue it. We discussed other treatment options but for now, she is not interested in adding new mediations.  Continue non pharmacologic treatment for now. Continue monitoring BP regularly.  Hyperlipidemia associated with type 2 diabetes mellitus (HCC) Assessment & Plan: She has not tolerated statins. She is on Zetia  10 mg daily but last refill in 07/2022. Continue low fat diet. Last LDL not at goal, 124 in 10/2021. She is not fasting today, will plan on fasting labs next visit.  Orders: -     Ezetimibe ; Take 1 tablet (10 mg total) by mouth daily.  Dispense: 90 tablet; Refill: 3  Dizziness Metoprolol  Tartrate aggravated problem, so discontinued. Fall precautions discussed.  Chronic combined systolic and diastolic heart failure [facility]) Assessment & Plan: She wants to establish with a different cardiologist. Contact information provided , so she can call and arrange appt. Echo 12/2021 LVEF 55-60% and grade I diastolic dysfunction. 05/2021 LVEF 45-50%. She did not tolerate Jardiance , caused yeast infections. Metoprolol  caused dizziness, so discontinued. Continue low salt diet.  Screening for lung cancer -     CT CHEST LUNG CANCER SCREENING LOW DOSE WO CONTRAST; Future  Encounter for screening for  malignant neoplasm of breast, unspecified screening modality -     3D Screening Mammogram, Left and Right; Future  Return in about 3 months (around 06/05/2024) for chronic problems, Labs.   Abigaile Rossie, MD [facility]. [place] office.      [1]  Current Outpatient Medications on File Prior to Visit  Medication Sig Dispense Refill   aspirin  EC 81 MG tablet Take 1 tablet (81 mg total) by mouth daily. Swallow whole. 90 tablet 3   B COMPLEX-ZINC PO Take 1 tablet by mouth daily.     clobetasol  ointment (TEMOVATE ) 0.05 % Apply 5 mm from tube opening, applied from the distal skin crease of the index finger to the tip at night for 30 days. 30 g 1   diclofenac  Sodium (VOLTAREN ) 1 % GEL Apply 2 g topically 4 (four) times daily. 100 g 3   loratadine  (CLARITIN ) 10 MG tablet Take 10 mg by mouth daily as needed for allergies.     MAGNESIUM PO Take by mouth.     nitroGLYCERIN  (NITROSTAT ) 0.4 MG SL tablet Place 1 tablet (0.4 mg total) under the tongue every 5 (five) minutes x 3 doses as needed for chest pain. 10 tablet 0   pantoprazole  (PROTONIX ) 40 MG tablet TAKE 1 TABLET BY MOUTH EVERY DAY 90 tablet 3   triamcinolone  cream (KENALOG ) 0.1 % Apply 1 Application topically daily as needed (on affected skin.). 30 g 1   No current facility-administered medications on file prior to visit.  [2]  Allergies Allergen Reactions   Lipitor [Atorvastatin ] Other (See Comments)    Hair loss   Sulfonamide Derivatives     Rash/itching    "

## 2024-03-07 NOTE — Assessment & Plan Note (Signed)
 She wants to establish with a different cardiologist. Contact information provided , so she can call and arrange appt. Echo 12/2021 LVEF 55-60% and grade I diastolic dysfunction. 05/2021 LVEF 45-50%. She did not tolerate Jardiance , caused yeast infections. Metoprolol  caused dizziness, so discontinued. Continue low salt diet.

## 2024-03-07 NOTE — Assessment & Plan Note (Addendum)
 She has not tolerated statins. She is on Zetia  10 mg daily but last refill in 07/2022. Continue low fat diet. Last LDL not at goal, 124 in 10/2021. She is not fasting today, will plan on fasting labs next visit.

## 2024-03-07 NOTE — Assessment & Plan Note (Deleted)
 BP has improved but Metoprolol  Tartrate 25 mg 1/2 tab bid has caused dizziness, so discontinue it. Continue non pharmacologic treatment for now. Continue monitoring BP regularly.

## 2024-03-07 NOTE — Patient Instructions (Addendum)
 A few things to remember from today's visit:  Screening for lung cancer - Plan: CT CHEST LUNG CA SCREEN LOW DOSE W/O CM  Encounter for screening for malignant neoplasm of breast, unspecified screening modality - Plan: MM 3D SCREENING MAMMOGRAM BILATERAL BREAST  Hearing loss of right ear, unspecified hearing loss type  Primary hypertension  Start weaning odd Metoprolol , 1/2 tab daily for 5 days then every other day for 6 days and stop. Continue monitoring blood pressure. Please call cardiologist [phone]. Fasting abs next visit.  For right ear you can use debrox for wax.  If you need refills for medications you take chronically, please call your pharmacy. Do not use My Chart to request refills or for acute issues that need immediate attention. If you send a my chart message, it may take a few days to be addressed, specially if I am not in the office.  Please be sure medication list is accurate. If a new problem present, please set up appointment sooner than planned today.

## 2024-03-07 NOTE — Assessment & Plan Note (Signed)
 BP has improved but Metoprolol  Tartrate 25 mg 1/2 tab bid has caused dizziness, so discontinue it. Continue non pharmacologic treatment for now. Continue monitoring BP regularly.

## 2024-03-29 ENCOUNTER — Ambulatory Visit

## 2024-04-07 ENCOUNTER — Ambulatory Visit: Admission: RE | Admit: 2024-04-07 | Source: Ambulatory Visit

## 2024-04-07 DIAGNOSIS — Z1239 Encounter for other screening for malignant neoplasm of breast: Secondary | ICD-10-CM
